# Patient Record
Sex: Female | Born: 1935 | Race: White | Hispanic: No | Smoking: Never smoker
Health system: Southern US, Community
[De-identification: ages and names within clinical notes are randomized; demographics above are authoritative.]

## PROBLEM LIST (undated history)

## (undated) DIAGNOSIS — K219 Gastro-esophageal reflux disease without esophagitis: Secondary | ICD-10-CM

## (undated) DIAGNOSIS — I499 Cardiac arrhythmia, unspecified: Secondary | ICD-10-CM

## (undated) DIAGNOSIS — I639 Cerebral infarction, unspecified: Secondary | ICD-10-CM

## (undated) DIAGNOSIS — J849 Interstitial pulmonary disease, unspecified: Secondary | ICD-10-CM

## (undated) DIAGNOSIS — M810 Age-related osteoporosis without current pathological fracture: Secondary | ICD-10-CM

## (undated) DIAGNOSIS — I4891 Unspecified atrial fibrillation: Secondary | ICD-10-CM

## (undated) DIAGNOSIS — M1990 Unspecified osteoarthritis, unspecified site: Secondary | ICD-10-CM

## (undated) DIAGNOSIS — I82409 Acute embolism and thrombosis of unspecified deep veins of unspecified lower extremity: Secondary | ICD-10-CM

## (undated) HISTORY — DX: Gastro-esophageal reflux disease without esophagitis: K21.9

## (undated) HISTORY — PX: EYE SURGERY: SHX253

## (undated) HISTORY — DX: Age-related osteoporosis without current pathological fracture: M81.0

## (undated) HISTORY — PX: BILATERAL CARPAL TUNNEL RELEASE: SHX6508

## (undated) HISTORY — PX: ABDOMINAL HYSTERECTOMY: SHX81

## (undated) HISTORY — DX: Interstitial pulmonary disease, unspecified: J84.9

---

## 2000-12-01 ENCOUNTER — Ambulatory Visit (HOSPITAL_COMMUNITY): Admission: RE | Admit: 2000-12-01 | Discharge: 2000-12-01 | Payer: Self-pay

## 2002-01-11 ENCOUNTER — Ambulatory Visit (HOSPITAL_COMMUNITY): Admission: RE | Admit: 2002-01-11 | Discharge: 2002-01-11 | Payer: Self-pay

## 2003-01-17 ENCOUNTER — Ambulatory Visit (HOSPITAL_COMMUNITY): Admission: RE | Admit: 2003-01-17 | Discharge: 2003-01-17 | Payer: Self-pay

## 2004-01-18 ENCOUNTER — Ambulatory Visit (HOSPITAL_COMMUNITY): Admission: RE | Admit: 2004-01-18 | Discharge: 2004-01-18 | Payer: Self-pay | Admitting: *Deleted

## 2005-02-16 ENCOUNTER — Ambulatory Visit (HOSPITAL_COMMUNITY): Admission: RE | Admit: 2005-02-16 | Discharge: 2005-02-16 | Payer: Self-pay | Admitting: Family Medicine

## 2005-08-10 ENCOUNTER — Ambulatory Visit: Payer: Self-pay | Admitting: Cardiovascular Disease

## 2005-09-03 ENCOUNTER — Ambulatory Visit: Payer: Self-pay | Admitting: Gastroenterology

## 2006-02-22 ENCOUNTER — Ambulatory Visit (HOSPITAL_COMMUNITY): Admission: RE | Admit: 2006-02-22 | Discharge: 2006-02-22 | Payer: Self-pay | Admitting: Nurse Practitioner

## 2006-03-25 ENCOUNTER — Ambulatory Visit: Payer: Self-pay | Admitting: Nurse Practitioner

## 2006-03-31 ENCOUNTER — Ambulatory Visit: Payer: Self-pay | Admitting: General Surgery

## 2006-03-31 ENCOUNTER — Other Ambulatory Visit: Payer: Self-pay

## 2006-04-05 ENCOUNTER — Ambulatory Visit: Payer: Self-pay | Admitting: General Surgery

## 2007-01-13 ENCOUNTER — Ambulatory Visit: Payer: Self-pay | Admitting: *Deleted

## 2007-01-24 ENCOUNTER — Ambulatory Visit: Payer: Self-pay | Admitting: Family Medicine

## 2007-02-24 ENCOUNTER — Ambulatory Visit (HOSPITAL_COMMUNITY): Admission: RE | Admit: 2007-02-24 | Discharge: 2007-02-24 | Payer: Self-pay | Admitting: Family Medicine

## 2008-02-28 ENCOUNTER — Ambulatory Visit (HOSPITAL_COMMUNITY): Admission: RE | Admit: 2008-02-28 | Discharge: 2008-02-28 | Payer: Self-pay | Admitting: Family Medicine

## 2009-04-08 ENCOUNTER — Ambulatory Visit (HOSPITAL_COMMUNITY): Admission: RE | Admit: 2009-04-08 | Discharge: 2009-04-08 | Payer: Self-pay | Admitting: Family Medicine

## 2009-04-15 ENCOUNTER — Ambulatory Visit: Payer: Self-pay | Admitting: Family Medicine

## 2010-03-05 ENCOUNTER — Ambulatory Visit: Payer: Self-pay | Admitting: Gastroenterology

## 2010-06-02 ENCOUNTER — Ambulatory Visit: Payer: Self-pay | Admitting: Gastroenterology

## 2010-06-04 LAB — PATHOLOGY REPORT

## 2010-08-03 ENCOUNTER — Encounter: Payer: Self-pay | Admitting: Nurse Practitioner

## 2011-01-07 ENCOUNTER — Other Ambulatory Visit (HOSPITAL_COMMUNITY): Payer: Self-pay | Admitting: Family Medicine

## 2011-01-07 DIAGNOSIS — Z139 Encounter for screening, unspecified: Secondary | ICD-10-CM

## 2011-01-09 ENCOUNTER — Ambulatory Visit (HOSPITAL_COMMUNITY)
Admission: RE | Admit: 2011-01-09 | Discharge: 2011-01-09 | Disposition: A | Discharge status: Home / Self Care | Payer: Medicare Other | Source: Ambulatory Visit | Attending: Family Medicine | Admitting: Family Medicine

## 2011-01-09 ENCOUNTER — Ambulatory Visit (HOSPITAL_COMMUNITY): Payer: Medicare Other

## 2011-01-09 DIAGNOSIS — Z1231 Encounter for screening mammogram for malignant neoplasm of breast: Secondary | ICD-10-CM | POA: Insufficient documentation

## 2011-01-09 DIAGNOSIS — Z139 Encounter for screening, unspecified: Secondary | ICD-10-CM

## 2011-05-15 ENCOUNTER — Emergency Department: Payer: Self-pay | Admitting: Emergency Medicine

## 2011-11-19 ENCOUNTER — Observation Stay: Payer: Self-pay | Admitting: Internal Medicine

## 2011-11-19 LAB — CBC
MCHC: 32.7 g/dL (ref 32.0–36.0)
MCV: 100 fL (ref 80–100)
WBC: 5.3 10*3/uL (ref 3.6–11.0)

## 2011-11-19 LAB — URINALYSIS, COMPLETE
Bacteria: NONE SEEN
Nitrite: NEGATIVE
Ph: 5 (ref 4.5–8.0)
Specific Gravity: 1.012 (ref 1.003–1.030)
WBC UR: 4 /HPF (ref 0–5)

## 2011-11-19 LAB — TROPONIN I: Troponin-I: <0.02 ng/mL

## 2011-11-19 LAB — COMPREHENSIVE METABOLIC PANEL
Alkaline Phosphatase: 48 U/L — ABNORMAL LOW (ref 50–136)
Co2: 29 mmol/L (ref 21–32)
Glucose: 90 mg/dL (ref 65–99)
Potassium: 3.7 mmol/L (ref 3.5–5.1)
SGPT (ALT): 18 U/L
Total Protein: 6.6 g/dL (ref 6.4–8.2)

## 2011-11-19 LAB — CK TOTAL AND CKMB (NOT AT ARMC): CK, Total: 118 U/L (ref 21–215)

## 2011-11-20 DIAGNOSIS — I369 Nonrheumatic tricuspid valve disorder, unspecified: Secondary | ICD-10-CM

## 2011-11-20 LAB — LIPID PANEL
Cholesterol: 163 mg/dL (ref 0–200)
Ldl Cholesterol, Calc: 71 mg/dL (ref 0–100)
VLDL Cholesterol, Calc: 13 mg/dL (ref 5–40)

## 2011-11-20 LAB — CK-MB
CK-MB: 1.2 ng/mL (ref 0.5–3.6)
CK-MB: 1.6 ng/mL (ref 0.5–3.6)

## 2011-12-03 ENCOUNTER — Encounter (HOSPITAL_COMMUNITY): Payer: Self-pay | Admitting: Emergency Medicine

## 2011-12-03 ENCOUNTER — Emergency Department (HOSPITAL_COMMUNITY)
Admission: EM | Admit: 2011-12-03 | Discharge: 2011-12-03 | Disposition: A | Discharge status: Home / Self Care | Payer: Medicare Other | Attending: Emergency Medicine | Admitting: Emergency Medicine

## 2011-12-03 DIAGNOSIS — I4891 Unspecified atrial fibrillation: Secondary | ICD-10-CM | POA: Insufficient documentation

## 2011-12-03 DIAGNOSIS — R202 Paresthesia of skin: Secondary | ICD-10-CM

## 2011-12-03 DIAGNOSIS — R209 Unspecified disturbances of skin sensation: Secondary | ICD-10-CM | POA: Insufficient documentation

## 2011-12-03 DIAGNOSIS — Z8679 Personal history of other diseases of the circulatory system: Secondary | ICD-10-CM | POA: Insufficient documentation

## 2011-12-03 HISTORY — DX: Unspecified osteoarthritis, unspecified site: M19.90

## 2011-12-03 HISTORY — DX: Cerebral infarction, unspecified: I63.9

## 2011-12-03 HISTORY — DX: Unspecified atrial fibrillation: I48.91

## 2011-12-03 NOTE — ED Notes (Signed)
Patient given discharge paperwork; went over discharge instructions with patient.  Instructed patient to follow up with primary care physician/referral within four days and ask about Holter monitor.  Patient instructed to return to the ED for new, worsening, or concerning symptoms.

## 2011-12-03 NOTE — Discharge Instructions (Signed)
The tingling in your lips are not indicative of a stroke. Get plenty of rest, and drink a lot of fluids. Call your doctor to arrange followup care. And may be helpful to get a Holter monitor to evaluate your periods of dizziness.    Paresthesia Paresthesia is a burning or prickling feeling. This feeling can happen in any part of the body. It often happens in the hands, arms, legs, or feet. HOME CARE  Avoid drinking alcohol.   Try massage or needle therapy (acupuncture) to help with your problems.   Keep all doctor visits as told.  GET HELP RIGHT AWAY IF:   You feel weak.   You have trouble walking or moving.   You have problems speaking or seeing.   You feel confused.   You cannot control when you poop (bowel movement) or pee (urinate).   You lose feeling (numbness) after an injury.   You pass out (faint).   Your burning or prickling feeling gets worse when you walk.   You have pain, cramps, or feel dizzy.   You have a rash.  MAKE SURE YOU:   Understand these instructions.   Will watch your condition.   Will get help right away if you are not doing well or get worse.  Document Released: 06/11/2008 Document Revised: 06/18/2011 Document Reviewed: 03/20/2011 [facility] Patient Information 2012 [place], [place].

## 2011-12-03 NOTE — ED Notes (Signed)
Patient with recent history of stroke with tingling in lips and moving down to feet.  Patient states is on plavix.  No facial droop noted, equal hand grips and equal foot pushes.

## 2011-12-03 NOTE — ED Provider Notes (Addendum)
History     CSN: [id number]  Arrival date & time 12/03/11  2100   First MD Initiated Contact with Patient 12/03/11 2219      Chief Complaint  Patient presents with  . Tingling    (Consider location/radiation/quality/duration/timing/severity/associated sxs/prior treatment) HPI Comments: Kelly Wright is a 78 y.o. Female who states that she has been having a varying tingling sensation of both sides of her upper and lower lips since yesterday. She has felt dizzy at times, but it is not persistent. She has no trouble talking, walking, lifting, and Eating. She denies headache. She was diagnosed with a left brain stroke, 2 weeks ago. She was put on Plavix. When she went to the hospital 2 weeks ago, an ambulance attendant told her that she might have atrial fibrillation. Today, she went to an urgent care for the tingling and was told that she might have atrial fibrillation, but they're EKG machine was broken, so she came here to be evaluated further.   The history is provided by the patient.    Past Medical History  Diagnosis Date  . Cerebral vascular accident   . Atrial fibrillation   . Arthritis     History reviewed. No pertinent past surgical history.  History reviewed. No pertinent family history.  History  Substance Use Topics  . Smoking status: Never Smoker   . Smokeless tobacco: Not on file  . Alcohol Use: No    OB History    Grav Para Term Preterm Abortions TAB SAB Ect Mult Living                  Review of Systems  All other systems reviewed and are negative.    Allergies  Review of patient's allergies indicates no known allergies.  Home Medications   Current Outpatient Rx  Name Route Sig Dispense Refill  . CLOPIDOGREL BISULFATE 75 MG PO TABS Oral Take 75 mg by mouth daily.    [name] FOLIC ACID 1 MG PO TABS Oral Take 1 mg by mouth daily.    [name] METHOTREXATE 2.5 MG PO TABS Oral Take 12.5 mg by mouth once a week. Caution:Chemotherapy. Protect from light.    [name]  METOPROLOL SUCCINATE ER 50 MG PO TB24 Oral Take 50 mg by mouth daily. Take with or immediately following a meal.    . SIMVASTATIN 20 MG PO TABS Oral Take 20 mg by mouth at bedtime.      BP 130/54  Pulse 66  Temp(Src) 97.9 F (36.6 C) (Oral)  Resp 17  SpO2 100%  Physical Exam  Nursing note and vitals reviewed. Constitutional: She is oriented to person, place, and time. She appears well-developed and well-nourished.  HENT:  Head: Normocephalic and atraumatic.  Eyes: Conjunctivae and EOM are normal. Pupils are equal, round, and reactive to light.  Neck: Normal range of motion and phonation normal. Neck supple.  Cardiovascular: Normal rate, regular rhythm and intact distal pulses.   Pulmonary/Chest: Effort normal and breath sounds normal. She exhibits no tenderness.  Abdominal: Soft. She exhibits no distension. There is no tenderness. There is no guarding.  Musculoskeletal: Normal range of motion.  Neurological: She is alert and oriented to person, place, and time. She has normal strength. She exhibits normal muscle tone.       No aphasia or dysarthria. No facial numbness to light touch sensation.  Skin: Skin is warm and dry.  Psychiatric: She has a normal mood and affect. Her behavior is normal. Judgment and thought content  normal.    ED Course  Procedures (including critical care time)  Labs Reviewed - No data to display No results found.  Date: 12/03/2011  Rate: 59  Rhythm: normal sinus rhythm  QRS Axis: normal  Intervals: normal  ST/T Wave abnormalities: normal  Conduction Disutrbances:none  Narrative Interpretation:   Old EKG Reviewed: none available   1. Paresthesia       MDM  Nonspecific symptoms, doubt CVA, metabolic instability or occult infection. Patient stable for discharge.   Plan: Home Medications- usual; Home Treatments- rest; Recommended follow up- PCP in 4 days, consider Holter for periodic dizziness        Flint Melter, MD 12/03/11  2325  Flint Melter, MD 12/03/11 2326

## 2011-12-03 NOTE — ED Notes (Signed)
Pt had a stroke 2 weeks ago. Symptoms including tingling of lips and feet before the last stroke. Pt stated today she started to have the same symptoms. Pt has a history of A-fib and is currently on Plavix. Pt denies pain

## 2012-03-10 ENCOUNTER — Ambulatory Visit: Payer: Self-pay | Admitting: Family Medicine

## 2012-08-30 ENCOUNTER — Ambulatory Visit: Payer: Self-pay | Admitting: Family Medicine

## 2013-04-03 ENCOUNTER — Emergency Department: Payer: Self-pay | Admitting: Emergency Medicine

## 2013-04-03 LAB — URINALYSIS, COMPLETE
Bilirubin,UR: NEGATIVE
Hyaline Cast: 4
Nitrite: NEGATIVE
Ph: 5 (ref 4.5–8.0)
Protein: NEGATIVE
RBC,UR: 3 /HPF (ref 0–5)
Specific Gravity: 1.019 (ref 1.003–1.030)
Squamous Epithelial: 2
WBC UR: 19 /HPF (ref 0–5)

## 2013-04-03 LAB — COMPREHENSIVE METABOLIC PANEL
Albumin: 3.4 g/dL (ref 3.4–5.0)
BUN: 20 mg/dL — ABNORMAL HIGH (ref 7–18)
Bilirubin,Total: 0.4 mg/dL (ref 0.2–1.0)
Chloride: 108 mmol/L — ABNORMAL HIGH (ref 98–107)
Creatinine: 0.97 mg/dL (ref 0.60–1.30)
EGFR (Non-African Amer.): 56 — ABNORMAL LOW
Glucose: 104 mg/dL — ABNORMAL HIGH (ref 65–99)
Osmolality: 284 (ref 275–301)
Potassium: 3.8 mmol/L (ref 3.5–5.1)
SGOT(AST): 23 U/L (ref 15–37)
SGPT (ALT): 19 U/L (ref 12–78)
Sodium: 141 mmol/L (ref 136–145)

## 2013-04-03 LAB — CBC
HCT: 36.8 % (ref 35.0–47.0)
MCHC: 33.8 g/dL (ref 32.0–36.0)
MCV: 97 fL (ref 80–100)
Platelet: 152 10*3/uL (ref 150–440)
RBC: 3.79 10*6/uL — ABNORMAL LOW (ref 3.80–5.20)
RDW: 15.1 % — ABNORMAL HIGH (ref 11.5–14.5)

## 2013-04-12 ENCOUNTER — Ambulatory Visit: Payer: Self-pay | Admitting: Gastroenterology

## 2014-04-13 ENCOUNTER — Ambulatory Visit: Payer: Medicare Other | Admitting: Family Medicine

## 2014-11-04 NOTE — H&P (Signed)
PATIENT NAME:  Kelly Wright, Kelly Wright MR#:  [id number] DATE OF BIRTH:  March 05, 1933  DATE OF ADMISSION:  11/19/2011  PRIMARY CARE PHYSICIAN: Darreld Mclean, MD, [facility]   CHIEF COMPLAINT: Difficulty speaking today and right facial droop.   HISTORY OF PRESENT ILLNESS: The patient is a pleasant 81 year old Caucasian female with history of rheumatoid arthritis who comes to the Emergency Room accompanied by her daughter with the above-mentioned chief complaint. The patient was mowing the lawn this afternoon. Physical therapy who works with the patient's husband saw the patient and did not seem to look right with some right facial droop and the patient had difficulty producing words. She could comprehend but was not able to say any sentences. She could say yes or no only. Physical therapist got worried, alarmed the patient's family members who called EMS and brought her to the Emergency Room. The patient did have a right facial droop. By the time she came in the Emergency Room it was more than three hours since her symptoms happened and she started having clear speech with clear thought process. It improved over the course. She received aspirin 325 mg in the Emergency Room. The patient's blood pressure is stable. Her CT head is negative for ischemic stroke. She is being admitted for possible TIA.   PAST MEDICAL HISTORY:  1. Rheumatoid arthritis.  2. Irregular heartbeat, on metoprolol for that.  3. Anxiety.   PAST SURGICAL HISTORY:  1. Cholecystectomy.  2. Partial hysterectomy.   SOCIAL HISTORY: Lives at home with her husband. Nonsmoker. Nonalcoholic.   ALLERGIES: No known drug allergies.   MEDICATIONS:  1. Alprazolam 0.5 mg p.o. daily as needed.  2. Folic acid 1 mg daily.  3. Loratadine 10 mg daily.  4. Methotrexate 2.5 mg 5 tablets orally once a week on Wednesday.  5. Metoprolol ER 50 mg p.o. daily.   FAMILY HISTORY: Positive for emphysema in father. Mother had Hodgkin's lymphoma. Grandmother had CVA.    REVIEW OF SYSTEMS: CONSTITUTIONAL: No fever, fatigue, weakness. EYES: No blurred or double vision. ENT: No tinnitus, ear pain, hearing loss. RESPIRATORY: No cough, wheeze, hemoptysis. CARDIOVASCULAR: No chest pain, orthopnea, edema. GI: No nausea, vomiting, diarrhea, or abdominal pain. GU: No dysuria or hematuria. ENDOCRINE: No polyuria or nocturia. HEMATOLOGY: No anemia or easy bruising. SKIN: No acne or rash. MUSCULOSKELETAL: Positive for arthritis. NEUROLOGIC: The patient did have some dysarthria. No focal weakness. SKIN: Warm and dry. All other systems reviewed and negative.   PHYSICAL EXAMINATION:   GENERAL: The patient is awake, alert, oriented x3 not in acute distress.   VITAL SIGNS: She is afebrile, pulse 57, blood pressure 149/68, sats are 98% on room air.   HEENT: Atraumatic, normocephalic. Pupils equal, round, and reactive to light and accommodation. Extraocular movements intact. Oral mucosa is moist.   NECK: Supple. No JVD. No carotid bruit.   RESPIRATORY: Clear to auscultation bilaterally. No rales, rhonchi, respiratory distress, or labored breathing.   CARDIOVASCULAR: Both the heart sounds are normal. Rate, rhythm is regular. PMI not lateralized. Chest nontender. No lower extremity edema.   ABDOMEN: Soft, benign, nontender. No organomegaly. Positive bowel sounds.   NEUROLOGIC: Grossly intact cranial nerves II through XII. The patient does have a very subtle right facial droop. No dysarthria or expressive aphasia. Motor strength 4+/5 in both upper and lower extremities. No sensory deficits. Reflexes 1+ in both upper and lower extremities. Plantars downgoing.   SKIN: Warm and dry.   LABORATORY, DIAGNOSTIC, AND RADIOLOGICAL DATA: Urinalysis negative for UTI.  CT of the head without contrast no evidence of acute intracranial abnormality. CBC within normal limits. Comprehensive metabolic panel within normal limits. EKG shows sinus bradycardia. No acute ST elevation or depression.    ASSESSMENT: 81 year old Ms. Sacra with:  1. Transient ischemic attack. The patient had symptoms of expressive aphasia. She was able to comprehend, however, unable to produce words for a couple of hours which improved. Course in the Emergency Room she is able to comprehend and talk without any difficulty. The patient also had some mild very subtle right facial droop. CT of the head is negative.  2. Irregular heart rhythm for which patient is on beta-blockers. Currently rhythm is bradycardic.  3. Elevated blood pressure without diagnosis of hypertension.  4. Rheumatoid arthritis.  5. Anxiety.    PLAN:  1. Admit patient to telemetry floor for overnight observation.  2. FULL CODE.  3. Check lipid profile.  4. IV fluids x1 liter.  5. Will continue all home medications.  6. Start patient on regular strength 325 mg p.o. daily aspirin.  7. Check MRI in the morning and ultrasound carotid Doppler along with Echo Doppler.  8. Physical therapy to see the patient in consultation.  9. Will continue regular diet for now.  10. Further work-up according to the patient's clinical course.   Hospital admission plan was discussed with the patient and family members who is agreeable to it.   TIME SPENT: 50 minutes.   ____________________________ Wylie Hail Allena Katz, MD sap:drc D: 11/19/2011 20:15:25 ET T: 11/20/2011 06:35:30 ET JOB#: [id number] cc: Terressa Evola A. Allena Katz, MD, <Dictator>, Leanna Sato, MD Willow Ora MD ELECTRONICALLY SIGNED 11/22/2011 13:56

## 2014-11-04 NOTE — Discharge Summary (Signed)
PATIENT NAME:  Kelly Wright, Kelly Wright MR#:  [id number] DATE OF BIRTH:  [birth year]  DATE OF ADMISSION:  11/19/2011 DATE OF DISCHARGE:  11/20/2011  PRIMARY CARE PHYSICIAN: Dr. Darreld Mclean, M.D.   FINAL DIAGNOSES:  1. Acute stroke left frontal temporal region.  2. Urinary tract infection.  3. Hypertension.  4. Rheumatoid arthritis.   MEDICATIONS ON DISCHARGE:  Methotrexate 2.5 mg 5 tablets once a week on Wednesday, metoprolol ER 50 mg daily. Folic acid 1 mg daily. Xanax 0.5 mg 1/2 tablet as needed. Loratadine 10 mg daily. Plavix 75 mg daily. Bactrim DS 1 tablet twice a day until completion. Lipitor 10 mg p.o. nightly.   DIET: Low sodium diet.   ACTIVITY: Activity as tolerated.   FOLLOWUP: With home health PT and R.N., follow-up in one week with Dr. Darreld Mclean. No driving until follow-up appointment.   REASON FOR ADMISSION: The patient was admitted 11/19/2011, discharged 11/20/2011. Came in with chief complaint of difficulty speaking and right facial droop.   HISTORY OF PRESENT ILLNESS: 81 year old female with history of rheumatoid arthritis came in with difficulty speaking and right facial droop. The patient had difficulty producing words and was able to answer yes and no questions. The patient received an aspirin 325 mg in the Emergency Room and her CT scan of the head was negative. The patient was admitted for transient ischemic attack type symptoms.   LABORATORY, DIAGNOSTIC, AND RADIOLOGICAL DATA: During the hospital course included a troponin that was negative. Glucose 90, BUN 17, creatinine 0.91, sodium 144, potassium 3.7, chloride 109, CO2 29, calcium 8.7. Liver function tests normal range, white blood cell count 5.3, hemoglobin and hematocrit 12.2 and 37.4, platelet count 132. CT scan of the head showed no acute evidence of acute intracranial abnormality. EKG showed sinus bradycardia, nonspecific ST-T wave changes. Urine culture grew out mixed bacterial organisms.   URINALYSIS: 2+ leukocyte  esterase. LDL 71, HDL 79, triglycerides 64. Echocardiogram showed no cardiac source of emboli noted. LV systolic function normal, ejection fraction greater than 55%, borderline concentric left ventricular hypertrophy, mild mitral regurgitation and mild to moderate tricuspid regurgitation. Ultrasound of the carotids showed no evidence of hemodynamically significant stenosis bilaterally. MRI of the brain showed punctate tiny area of acute ischemia in the left frontal temporal region. White matter changes consistent with chronic ischemia, vascular flow voids are normal.   COURSE PER PROBLEM LIST:  1. For acute stroke in the left frontal temporal region, she was given an aspirin in the Emergency Room, she was switched over to Plavix because the  family states that she takes an aspirin at home. Since she had no of focal neurological deficits, she was walking well and strength was good, she was discharged home. Plavix was started and a small dose of Lipitor started. She will follow up with Dr. Darreld Mclean. She has had some difficulty with thinking so home health R.N. and home health PT were ordered. I advised no driving until follow-up appointment with Dr. Darreld Mclean.  2. For urinary tract infection. Bactrim was prescribed. Urine culture grew out contaminant. Can consider further testing as outpatient.  3. For hypertension and bradycardia. Metoprolol was given but I think this is more secondary to heart arrhythmia. I asked the patient if she wanted to decrease her dose of metoprolol and she did not want to. Heart rate upon discharge was 54, blood pressure ranged between 103/56, and 147/72. 4. For her rheumatoid arthritis, she is on methotrexate and folic acid.   Time Spent ON  discharge: 40 minutes. The patient was discharged home in stable condition with close clinical follow-up.    ____________________________ Herschell Dimes. Renae Gloss, MD rjw:ljs D: 11/23/2011 12:34:00 ET      T: 11/24/2011 12:30:08  ET JOB#: [id number] cc: Herschell Dimes. Renae Gloss, MD, <Dictator> Leanna Sato, MD Salley Scarlet MD ELECTRONICALLY SIGNED 11/29/2011 14:41

## 2014-11-11 ENCOUNTER — Inpatient Hospital Stay
Admission: EM | Admit: 2014-11-11 | Discharge: 2014-11-16 | DRG: 689 | Disposition: A | Discharge status: Home / Self Care | Payer: Medicare Other | Attending: Internal Medicine | Admitting: Internal Medicine

## 2014-11-11 ENCOUNTER — Encounter: Payer: Self-pay | Admitting: Emergency Medicine

## 2014-11-11 DIAGNOSIS — N390 Urinary tract infection, site not specified: Secondary | ICD-10-CM

## 2014-11-11 DIAGNOSIS — Z79899 Other long term (current) drug therapy: Secondary | ICD-10-CM

## 2014-11-11 DIAGNOSIS — I4891 Unspecified atrial fibrillation: Secondary | ICD-10-CM

## 2014-11-11 DIAGNOSIS — M1990 Unspecified osteoarthritis, unspecified site: Secondary | ICD-10-CM | POA: Diagnosis present

## 2014-11-11 DIAGNOSIS — N3090 Cystitis, unspecified without hematuria: Principal | ICD-10-CM | POA: Diagnosis present

## 2014-11-11 DIAGNOSIS — Z7982 Long term (current) use of aspirin: Secondary | ICD-10-CM

## 2014-11-11 DIAGNOSIS — M069 Rheumatoid arthritis, unspecified: Secondary | ICD-10-CM

## 2014-11-11 DIAGNOSIS — Z823 Family history of stroke: Secondary | ICD-10-CM

## 2014-11-11 DIAGNOSIS — G9341 Metabolic encephalopathy: Secondary | ICD-10-CM

## 2014-11-11 DIAGNOSIS — Z86718 Personal history of other venous thrombosis and embolism: Secondary | ICD-10-CM

## 2014-11-11 DIAGNOSIS — Z7901 Long term (current) use of anticoagulants: Secondary | ICD-10-CM

## 2014-11-11 DIAGNOSIS — Z8673 Personal history of transient ischemic attack (TIA), and cerebral infarction without residual deficits: Secondary | ICD-10-CM

## 2014-11-11 DIAGNOSIS — Z7902 Long term (current) use of antithrombotics/antiplatelets: Secondary | ICD-10-CM

## 2014-11-11 DIAGNOSIS — I499 Cardiac arrhythmia, unspecified: Secondary | ICD-10-CM | POA: Diagnosis present

## 2014-11-11 HISTORY — DX: Acute embolism and thrombosis of unspecified deep veins of unspecified lower extremity: I82.409

## 2014-11-11 HISTORY — DX: Cardiac arrhythmia, unspecified: I49.9

## 2014-11-11 LAB — URINALYSIS COMPLETE WITH MICROSCOPIC (ARMC ONLY)
Bilirubin Urine: NEGATIVE
Glucose, UA: NEGATIVE mg/dL
HGB URINE DIPSTICK: NEGATIVE
NITRITE: NEGATIVE
PROTEIN: 30 mg/dL — AB
SPECIFIC GRAVITY, URINE: 1.027 (ref 1.005–1.030)
pH: 5 (ref 5.0–8.0)

## 2014-11-11 LAB — CBC
HEMATOCRIT: 42.7 % (ref 35.0–47.0)
Hemoglobin: 14.1 g/dL (ref 12.0–16.0)
MCH: 32.8 pg (ref 26.0–34.0)
MCHC: 33.1 g/dL (ref 32.0–36.0)
MCV: 99.1 fL (ref 80.0–100.0)
Platelets: 138 10*3/uL — ABNORMAL LOW (ref 150–440)
RBC: 4.31 MIL/uL (ref 3.80–5.20)
RDW: 14.9 % — AB (ref 11.5–14.5)
WBC: 4.7 10*3/uL (ref 3.6–11.0)

## 2014-11-11 LAB — COMPREHENSIVE METABOLIC PANEL
ALBUMIN: 4 g/dL (ref 3.5–5.0)
ALK PHOS: 47 U/L (ref 38–126)
ALT: 16 U/L (ref 14–54)
ANION GAP: 9 (ref 5–15)
AST: 28 U/L (ref 15–41)
BILIRUBIN TOTAL: 1.3 mg/dL — AB (ref 0.3–1.2)
BUN: 20 mg/dL (ref 6–20)
CHLORIDE: 103 mmol/L (ref 101–111)
CO2: 26 mmol/L (ref 22–32)
Calcium: 8.9 mg/dL (ref 8.9–10.3)
Creatinine, Ser: 0.98 mg/dL (ref 0.44–1.00)
GFR calc Af Amer: >60 mL/min (ref >60)
GFR, EST NON AFRICAN AMERICAN: 53 mL/min — AB (ref >60)
Glucose, Bld: 143 mg/dL — ABNORMAL HIGH (ref 65–99)
POTASSIUM: 3.7 mmol/L (ref 3.5–5.1)
Sodium: 138 mmol/L (ref 135–145)
Total Protein: 6.8 g/dL (ref 6.5–8.1)

## 2014-11-11 LAB — PROTIME-INR
INR: 1.23
PROTHROMBIN TIME: 15.7 s — AB (ref 11.4–15.0)

## 2014-11-11 MED ORDER — DEXTROSE 5 % IV SOLN
1.0000 g | Freq: Once | INTRAVENOUS | Status: AC
  Administered 2014-11-11: 1 g via INTRAVENOUS

## 2014-11-11 MED ORDER — ONDANSETRON HCL 4 MG/2ML IJ SOLN
INTRAMUSCULAR | Status: AC
  Administered 2014-11-11: 4 mg via INTRAVENOUS
  Filled 2014-11-11: qty 2

## 2014-11-11 MED ORDER — SODIUM CHLORIDE 0.9 % IV SOLN
Freq: Once | INTRAVENOUS | Status: AC
  Administered 2014-11-12: 01:00:00 via INTRAVENOUS

## 2014-11-11 MED ORDER — ONDANSETRON HCL 4 MG/2ML IJ SOLN
4.0000 mg | Freq: Once | INTRAMUSCULAR | Status: AC
  Administered 2014-11-11: 4 mg via INTRAVENOUS

## 2014-11-11 MED ORDER — DEXTROSE 5 % IV SOLN
INTRAVENOUS | Status: AC
  Filled 2014-11-11: qty 10

## 2014-11-11 NOTE — ED Provider Notes (Signed)
[facility] Emergency Department Provider Note    ____________________________________________  Time seen: ----------------------------------------- 11:32 PM on 11/11/2014 -----------------------------------------    I have reviewed the triage vital signs and the nursing notes.   HISTORY  Chief Complaint Altered Mental Status       HPI Kelly Wright is a 81 y.o. female with a past medical history of CVA, atrial fibrillation, and DVT on Xarelto. Patient presents to the emergency department today with confusion, weakness, and vomiting. According to the family the patient has been treated for UTI for approximately one week. Patient was initially on doxycycline, and changed to ciprofloxacin. The family is unsure if the antibiotics are working as the patient is vomiting at home. The daughter visited the patient today, and noted she was confused and very weak. The patient denies any fever at home. Denies abdominal pain. Denies dysuria. According to the daughter the patient does have intermittent mild confusion, but has been much worse over the last 24 hours.     Past Medical History  Diagnosis Date  . Cerebral vascular accident   . Atrial fibrillation   . Arthritis   . DVT (deep venous thrombosis)     There are no active problems to display for this patient.   Past Surgical History  Procedure Laterality Date  . Abdominal hysterectomy      Current Outpatient Rx  Name  Route  Sig  Dispense  Refill  . esomeprazole (NEXIUM) 20 MG capsule   Oral   Take 20 mg by mouth daily at 12 noon.         . folic acid (FOLVITE) 1 MG tablet   Oral   Take 1 mg by mouth daily.         [name] lisinopril (PRINIVIL,ZESTRIL) 10 MG tablet   Oral   Take 10 mg by mouth daily.         . methotrexate (RHEUMATREX) 2.5 MG tablet   Oral   Take 12.5 mg by mouth once a week. Caution:Chemotherapy. Protect from light.         . rivaroxaban (XARELTO) 20 MG TABS tablet    Oral   Take 20 mg by mouth daily with supper.         . simvastatin (ZOCOR) 20 MG tablet   Oral   Take 20 mg by mouth at bedtime.         . clopidogrel (PLAVIX) 75 MG tablet   Oral   Take 75 mg by mouth daily.         . metoprolol succinate (TOPROL-XL) 50 MG 24 hr tablet   Oral   Take 50 mg by mouth daily. Take with or immediately following a meal.           Allergies Review of patient's allergies indicates no known allergies.  History reviewed. No pertinent family history.  Social History History  Substance Use Topics  . Smoking status: Never Smoker   . Smokeless tobacco: Not on file  . Alcohol Use: No    Review of Systems  Constitutional: Negative for fever. ENT: Mild sinus congestion. Cardiovascular: Negative for chest pain. Respiratory: Negative for shortness of breath. Gastrointestinal: Negative for abdominal pain. Positive for nausea and vomiting. Genitourinary: Negative for dysuria. Musculoskeletal: Negative for back pain. Skin: Negative for rash. Neurological: Negative for headaches, focal weakness or numbness.  10-point ROS otherwise negative.  ____________________________________________   PHYSICAL EXAM:  VITAL SIGNS: ED Triage Vitals  Enc Vitals Group  BP 11/11/14 2015 130/60 mmHg     Pulse Rate 11/11/14 2015 65     Resp 11/11/14 2015 20     Temp 11/11/14 2015 98.6 F (37 C)     Temp Source 11/11/14 2015 Oral     SpO2 11/11/14 2015 100 %     Weight 11/11/14 2015 130 lb (58.968 kg)     Height 11/11/14 2015 5\' 9"  (1.753 m)     Head Cir --      Peak Flow --      Pain Score --      Pain Loc --      Pain Edu? --      Excl. in GC? --      Constitutional: Alert and oriented. Well appearing and in no distress. Eyes: Conjunctivae are normal. PERRL. ENT   Head: Normocephalic and atraumatic.   Nose: Mild congestion/rhinnorhea.   Mouth/Throat: Mucous membranes are moist. Cardiovascular: Normal rate, regular rhythm.   Respiratory: Normal respiratory effort without tachypnea nor retractions. Breath sounds are clear and equal bilaterally. No wheezes/rales/rhonchi. Gastrointestinal: Soft and nontender. No distention. No abdominal bruits. There is no CVA tenderness. Musculoskeletal: Nontender with normal range of motion in all extremities.  Neurologic:  Normal speech and language. No gross focal neurologic deficits are appreciated. Speech is normal.  Skin:  Skin is warm, dry and intact. No rash noted. Psychiatric: Mood and affect are normal.   EKG shows normal sinus rhythm at 68 bpm, narrow QRS, normal intervals, normal axis. Nonspecific ST changes.   INITIAL IMPRESSION / ASSESSMENT AND PLAN / ED COURSE  Pertinent labs & imaging results that were available during my care of the patient were reviewed by me and considered in my medical decision making (see chart for details).  The patient is a 81 year old female who presents to the emergency department with confusion, weakness, nausea and vomiting. The patient's urine is consistent with a urinary tract infection. The patient has failed home therapy. Patient lives alone, along with confusion/weakness/nausea and vomiting, I feel the patient was not safe to continue home therapy. The patient would benefit from inpatient admission for treatment of her urinary tract infection. Overall the patient appears well, nontoxic. I discussed this plan with the patient and family were agreeable.  ____________________________________________   FINAL CLINICAL IMPRESSION(S) / ED DIAGNOSES  Final diagnoses:  UTI (lower urinary tract infection)     78, MD 11/11/14 2350

## 2014-11-11 NOTE — ED Notes (Addendum)
Family says without them knowing pt had gone to [facility] about a week ago with UTI symptoms; a prescription was called in but pt and family unaware; Clinic called a few days later to see why medicine wasn't filled so medication was picked up by family and pt started; several days later pt was not feeling any better and was seen at walkin in clinic; pt started Cipro Thursday or Friday as ordered there; pt continues to feel weak; nausea with some vomiting over the last week; pt says while ambulating to treatment room she thought she may "pass out"; denies syncopal episodes this past week;

## 2014-11-11 NOTE — ED Notes (Signed)
Dr Paduchowski in to see pt 

## 2014-11-11 NOTE — ED Notes (Addendum)
Pt presents to ER with family. Family reports AMS, currently undergoing treatment for UTI. Pt currently alert and oriented. Moving all extremities normally, steady gait.

## 2014-11-12 ENCOUNTER — Encounter: Payer: Self-pay | Admitting: Internal Medicine

## 2014-11-12 DIAGNOSIS — G9341 Metabolic encephalopathy: Secondary | ICD-10-CM | POA: Diagnosis present

## 2014-11-12 DIAGNOSIS — N3090 Cystitis, unspecified without hematuria: Secondary | ICD-10-CM | POA: Diagnosis present

## 2014-11-12 DIAGNOSIS — M069 Rheumatoid arthritis, unspecified: Secondary | ICD-10-CM | POA: Diagnosis present

## 2014-11-12 DIAGNOSIS — I4891 Unspecified atrial fibrillation: Secondary | ICD-10-CM | POA: Diagnosis present

## 2014-11-12 DIAGNOSIS — I499 Cardiac arrhythmia, unspecified: Secondary | ICD-10-CM | POA: Diagnosis present

## 2014-11-12 DIAGNOSIS — Z7982 Long term (current) use of aspirin: Secondary | ICD-10-CM | POA: Diagnosis not present

## 2014-11-12 DIAGNOSIS — Z79899 Other long term (current) drug therapy: Secondary | ICD-10-CM | POA: Diagnosis not present

## 2014-11-12 DIAGNOSIS — Z8673 Personal history of transient ischemic attack (TIA), and cerebral infarction without residual deficits: Secondary | ICD-10-CM | POA: Diagnosis not present

## 2014-11-12 DIAGNOSIS — Z7901 Long term (current) use of anticoagulants: Secondary | ICD-10-CM

## 2014-11-12 DIAGNOSIS — N390 Urinary tract infection, site not specified: Secondary | ICD-10-CM

## 2014-11-12 DIAGNOSIS — Z86718 Personal history of other venous thrombosis and embolism: Secondary | ICD-10-CM | POA: Diagnosis not present

## 2014-11-12 DIAGNOSIS — Z823 Family history of stroke: Secondary | ICD-10-CM | POA: Diagnosis not present

## 2014-11-12 DIAGNOSIS — Z7902 Long term (current) use of antithrombotics/antiplatelets: Secondary | ICD-10-CM | POA: Diagnosis not present

## 2014-11-12 DIAGNOSIS — M1990 Unspecified osteoarthritis, unspecified site: Secondary | ICD-10-CM | POA: Diagnosis present

## 2014-11-12 LAB — CBC
HCT: 39 % (ref 35.0–47.0)
Hemoglobin: 12.8 g/dL (ref 12.0–16.0)
MCH: 32.6 pg (ref 26.0–34.0)
MCHC: 32.8 g/dL (ref 32.0–36.0)
MCV: 99.4 fL (ref 80.0–100.0)
PLATELETS: 122 10*3/uL — AB (ref 150–440)
RBC: 3.92 MIL/uL (ref 3.80–5.20)
RDW: 15.1 % — AB (ref 11.5–14.5)
WBC: 4.8 10*3/uL (ref 3.6–11.0)

## 2014-11-12 LAB — BASIC METABOLIC PANEL
ANION GAP: 6 (ref 5–15)
BUN: 18 mg/dL (ref 6–20)
CHLORIDE: 107 mmol/L (ref 101–111)
CO2: 26 mmol/L (ref 22–32)
Calcium: 8.3 mg/dL — ABNORMAL LOW (ref 8.9–10.3)
Creatinine, Ser: 0.85 mg/dL (ref 0.44–1.00)
GFR calc Af Amer: >60 mL/min (ref >60)
GLUCOSE: 104 mg/dL — AB (ref 65–99)
POTASSIUM: 3.4 mmol/L — AB (ref 3.5–5.1)
Sodium: 139 mmol/L (ref 135–145)

## 2014-11-12 MED ORDER — METHOTREXATE 2.5 MG PO TABS: 12.5000 mg | ORAL_TABLET | ORAL | Status: DC

## 2014-11-12 MED ORDER — RIVAROXABAN 20 MG PO TABS
20.0000 mg | ORAL_TABLET | Freq: Every day | ORAL | Status: DC
  Administered 2014-11-12 – 2014-11-15 (×4): 20 mg via ORAL
  Filled 2014-11-12 (×4): qty 1

## 2014-11-12 MED ORDER — METHOTREXATE 2.5 MG PO TABS
12.5000 mg | ORAL_TABLET | ORAL | Status: DC
  Administered 2014-11-14: 12.5 mg via ORAL
  Filled 2014-11-12: qty 5

## 2014-11-12 MED ORDER — CLOPIDOGREL BISULFATE 75 MG PO TABS
75.0000 mg | ORAL_TABLET | Freq: Every day | ORAL | Status: DC
  Administered 2014-11-12: 75 mg via ORAL
  Filled 2014-11-12: qty 1

## 2014-11-12 MED ORDER — ACETAMINOPHEN 325 MG PO TABS
650.0000 mg | ORAL_TABLET | Freq: Four times a day (QID) | ORAL | Status: DC | PRN
  Administered 2014-11-12 – 2014-11-13 (×2): 650 mg via ORAL
  Filled 2014-11-12 (×2): qty 2

## 2014-11-12 MED ORDER — SODIUM CHLORIDE 0.9 % IJ SOLN: 3.0000 mL | INTRAMUSCULAR | Status: DC | PRN

## 2014-11-12 MED ORDER — ASPIRIN EC 81 MG PO TBEC
81.0000 mg | DELAYED_RELEASE_TABLET | Freq: Every day | ORAL | Status: DC
  Administered 2014-11-12 – 2014-11-16 (×5): 81 mg via ORAL
  Filled 2014-11-12 (×5): qty 1

## 2014-11-12 MED ORDER — METOPROLOL SUCCINATE ER 50 MG PO TB24
50.0000 mg | ORAL_TABLET | Freq: Every day | ORAL | Status: DC
  Administered 2014-11-12: 50 mg via ORAL
  Filled 2014-11-12: qty 1

## 2014-11-12 MED ORDER — SIMVASTATIN 20 MG PO TABS
20.0000 mg | ORAL_TABLET | Freq: Every day | ORAL | Status: DC
  Administered 2014-11-12 – 2014-11-15 (×4): 20 mg via ORAL
  Filled 2014-11-12 (×4): qty 1

## 2014-11-12 MED ORDER — ONDANSETRON HCL 4 MG PO TABS: 4.0000 mg | ORAL_TABLET | Freq: Four times a day (QID) | ORAL | Status: DC | PRN

## 2014-11-12 MED ORDER — SODIUM CHLORIDE 0.9 % IJ SOLN
3.0000 mL | Freq: Two times a day (BID) | INTRAMUSCULAR | Status: DC
  Administered 2014-11-12 – 2014-11-16 (×7): 3 mL via INTRAVENOUS

## 2014-11-12 MED ORDER — FOLIC ACID 1 MG PO TABS
1.0000 mg | ORAL_TABLET | Freq: Every day | ORAL | Status: DC
  Administered 2014-11-12 – 2014-11-16 (×5): 1 mg via ORAL
  Filled 2014-11-12 (×5): qty 1

## 2014-11-12 MED ORDER — ACETAMINOPHEN 650 MG RE SUPP: 650.0000 mg | Freq: Four times a day (QID) | RECTAL | Status: DC | PRN

## 2014-11-12 MED ORDER — LISINOPRIL 10 MG PO TABS
10.0000 mg | ORAL_TABLET | Freq: Every day | ORAL | Status: DC
  Administered 2014-11-12 – 2014-11-13 (×2): 10 mg via ORAL
  Filled 2014-11-12 (×3): qty 1

## 2014-11-12 MED ORDER — ENSURE ENLIVE PO LIQD
237.0000 mL | Freq: Two times a day (BID) | ORAL | Status: DC
  Administered 2014-11-12 – 2014-11-15 (×6): 237 mL via ORAL

## 2014-11-12 MED ORDER — PANTOPRAZOLE SODIUM 40 MG PO TBEC
40.0000 mg | DELAYED_RELEASE_TABLET | Freq: Every day | ORAL | Status: DC
  Administered 2014-11-12 – 2014-11-16 (×5): 40 mg via ORAL
  Filled 2014-11-12 (×5): qty 1

## 2014-11-12 MED ORDER — CEFTRIAXONE SODIUM IN DEXTROSE 20 MG/ML IV SOLN
1.0000 g | INTRAVENOUS | Status: DC
  Administered 2014-11-12 – 2014-11-15 (×4): 1 g via INTRAVENOUS
  Filled 2014-11-12 (×5): qty 50

## 2014-11-12 MED ORDER — ONDANSETRON HCL 4 MG/2ML IJ SOLN
4.0000 mg | Freq: Four times a day (QID) | INTRAMUSCULAR | Status: DC | PRN
  Administered 2014-11-13: 4 mg via INTRAVENOUS
  Filled 2014-11-12: qty 2

## 2014-11-12 NOTE — ED Notes (Signed)
Pt given warm blanket for comfort; shirt/bra/shoes in belonging bag, ready for transport to the floor; pt denies pain; side rail up x 1 with callbell in reach; no complaints; waiting patiently for admitting doctor and transport to the floor

## 2014-11-12 NOTE — H&P (Signed)
[facility] History and Physical  Azucena Dart Smitherman ZOX:[id number] DOB: 15-May-1933 DOA: 11/11/2014  Referring physician: Dr.Paduchowsky PCP: Leanna Sato, MD   Chief Complaint: Confusion                               General weakness with nausea and vomiting HPI: Kelly Wright is a 81 y.o. female  With past medical history as below was brought to the emergency room by the patient's daughter with the complaints of found to having generalized weakness with vomiting and confusion. Patient states that she has been having some generalized weakness with episodes of vomiting since yesterday and hence came to the emergency room. In the emergency room patient was evaluated by the ED physician and was noted to be with stable vitals and the lab work came significant for significant urinary tract infection. Of note the patient was treated for a UTI as an outpatient with oral doxycycline and ciprofloxacin a week ago. Denies any fever, no dysuria frequency or urgency of urination no abdominal pain. Denies any chest pain shortness of breath cough. Denies any focal weakness or numbness.  Past Medical History  Diagnosis Date  . Cerebral vascular accident   . Atrial fibrillation   . Arthritis   . DVT (deep venous thrombosis)   . Dysrhythmia     Past Surgical History  Procedure Laterality Date  . Abdominal hysterectomy      Family History  Problem Relation Age of Onset  . Hodgkin's lymphoma Mother   . Cancer - Lung Father   . CVA Other     Social History  reports that she has never smoked. She does not have any smokeless tobacco history on file. She reports that she does not drink alcohol or use illicit drugs.  No Known Allergies  Prior to Admission medications   Medication Sig Start Date End Date Taking? Authorizing Provider  atorvastatin (LIPITOR) 10 MG tablet Take 10 mg by mouth daily.   Yes Historical Provider, MD  ciprofloxacin (CIPRO) 500 MG tablet Take 500 mg by mouth daily.   Yes  Historical Provider, MD  citalopram (CELEXA) 20 MG tablet Take 20 mg by mouth daily.   Yes Historical Provider, MD  doxycycline (ADOXA) 100 MG tablet Take 100 mg by mouth 2 (two) times daily.   Yes Historical Provider, MD  esomeprazole (NEXIUM) 20 MG capsule Take 20 mg by mouth daily at 12 noon.   Yes Historical Provider, MD  folic acid (FOLVITE) 1 MG tablet Take 1 mg by mouth daily.   Yes Historical Provider, MD  folic acid (FOLVITE) 1 MG tablet Take 1 mg by mouth daily.   Yes Historical Provider, MD  lisinopril (PRINIVIL,ZESTRIL) 10 MG tablet Take 10 mg by mouth daily.   Yes Historical Provider, MD  lisinopril (PRINIVIL,ZESTRIL) 5 MG tablet Take 5 mg by mouth daily.   Yes Historical Provider, MD  methotrexate (RHEUMATREX) 2.5 MG tablet Take 12.5 mg by mouth once a week. Caution:Chemotherapy. Protect from light.   Yes Historical Provider, MD  methotrexate (RHEUMATREX) 2.5 MG tablet Take 2.5 mg by mouth once a week. Caution:Chemotherapy. Protect from light.   Yes Historical Provider, MD  rivaroxaban (XARELTO) 20 MG TABS tablet Take 20 mg by mouth daily with supper.   Yes Historical Provider, MD  rivaroxaban (XARELTO) 20 MG TABS tablet Take 20 mg by mouth daily.   Yes Historical Provider, MD  simvastatin (ZOCOR) 20 MG tablet Take  20 mg by mouth at bedtime.   Yes Historical Provider, MD  clopidogrel (PLAVIX) 75 MG tablet Take 75 mg by mouth daily.    Historical Provider, MD  metoprolol succinate (TOPROL-XL) 50 MG 24 hr tablet Take 50 mg by mouth daily. Take with or immediately following a meal.    Historical Provider, MD     Review of Systems:  Constitutional: No fevers or chills, positive for fatigue& weakness. Eyes: No blurred or double vision, pain, redness, inflammation. ENT: No tinnitus, ear pain, hearing loss, epistaxis, discharge, redness of oropharynx, difficulty swallowing. Resp: No cough, wheeze, hemoptysis, dyspnea, painful respiration. Cardio-vascular: No chest pain, orthopnea,  edema, DOE, palpitations, syncope. GI: Positive for nausea & vomiting, No diarrhea, abdominal pain, hematemesis, melena, GERD, rectal bleeding, constipation. GU: No dysuria, hematuria, urgency, frequency, incontinence Endocrine: No polyuria, nocturia, heat or cold intolerance, thirst Hematologic/Lymphatic: No anemia, easy bruising bleeding, swollen glands Integumentary: No acne, rash, lesions. Musculoskeletal: No pain in: neck-back-shoulder-knee-hip, arthritis, gout, redness. Neuro: No numbness, weakness, dysarthria, epilepsy, tremor, vertigo, ataxia, dementia, headache, migraine, CVA, TIA, seizure, memory loss Psych: No anxiety, insomnia, ADD, OCD, bipolar, depression, schizophrenia.   Physical Exam: Constitutional: Filed Vitals:   11/11/14 2015 11/11/14 2316 11/12/14 0000 11/12/14 0030  BP: 130/60 135/61 102/60 121/59  Pulse: 65 107 51 63  Temp: 98.6 F (37 C)     TempSrc: Oral     Resp: 20   18  Height: 5\' 9"  (1.753 m)     Weight: 58.968 kg (130 lb)     SpO2: 100% 95% 99% 98%   Wt Readings from Last 3 Encounters:  11/11/14 58.968 kg (130 lb)   General:  Well developed, well nourished, in no apparent distress HEENT: PERRL, EOMI, no scleral icterus, no conjunctivitis, no difficulty hearing, no pharyngeal erythema, Mucosa -moist. Neck: Thyroid - no nodules,  supple, no masses, non tender, no adenopathy, no JVD, no Carotid bruit, FROM Cardiovascular: RRR, no m/r/g, no S3/S4, chest wall nontender, good pedal pulses, good femoral pulses, no LE edema. Respiratory: CTA bilaterally, no wheeze, no rales, no ronchi, breath sounds not diminished, breathing not labored, no increased respiratory effort. Abdomen: soft, nontender, nondistended, no mass, bowel sounds present and normoactive, no hepatosplenomegaly. Musculoskeletal: 5/5 muscular strength x4 extremities, no cyanosis/clubbing, no DJD, no kyphosis,  Skin: no rash, no lesions, no erythema. Lymphatic: No adenopathy  (cervical/axilla/inguinal/supraclavicular) Neurologic: Cranial nerves intact, DTR intact, Sensation intact, Babinski sign R/L, no dysarthria, no aphasia, no dysphagia, no contractures Psychiatric: Alert, Oriented (time, person, place, circumstance),  Cooperative, Judgement good, Memory intact, not confused, not agitated, not depressed, cooperative         Labs on Admission:  Basic Metabolic Panel:  Recent Labs Lab 11/11/14 2023  NA 138  K 3.7  CL 103  CO2 26  GLUCOSE 143*  BUN 20  CREATININE 0.98  CALCIUM 8.9   Liver Function Tests:  Recent Labs Lab 11/11/14 2023  AST 28  ALT 16  ALKPHOS 47  BILITOT 1.3*  PROT 6.8  ALBUMIN 4.0   No results for input(s): LIPASE, AMYLASE in the last 168 hours. No results for input(s): AMMONIA in the last 168 hours. CBC:  Recent Labs Lab 11/11/14 2023  WBC 4.7  HGB 14.1  HCT 42.7  MCV 99.1  PLT 138*   Cardiac Enzymes: No results for input(s): CKTOTAL, CKMB, CKMBINDEX, TROPONINI in the last 168 hours. BNP (last 3 results) No results for input(s): BNP in the last 8760 hours.  ProBNP (last 3 results)  No results for input(s): PROBNP in the last 8760 hours.  CBG: No results for input(s): GLUCAP in the last 168 hours.  Radiological Exams on Admission: No results found.  EKG: Independently reviewed. Sinus rhythm with ventricular rate of 68 bpm with premature atrial complexes. Nonspecific ST abnormality.  Assessment/Plan Principal Problem:   UTI (lower urinary tract infection) Active Problems:   Rheumatoid arthritis   Chronic anticoagulation   H/O: CVA (cerebrovascular accident)   Encephalopathy, metabolic   Atrial fibrillation, unspecified  1. UTI. Patient failed oral antibiotic treatment. Admitted to the medical floor. Urine for culture and sensitivity sent. Started on IV Rocephin. Tylenol when necessary. Follow up cultures. 2. Transient confusion secondary to metabolic encephalopathy, resolved currently patient  stable. 3. Chronic anticoagulation. Patient on Xarelto. Stable continue same. 4. History of rheumatoid arthritis. Patient on home medication, stable, continue home medications. 5. History of atrial fibrillation. Patient stable currently in sinus. Monitor. 6. History of CVA with complete recovery. Patient stable. No acute problems. Monitor.   Code Status: Full code. DVT Prophylaxis: Xarelto.  Time spent on admission: 50 minutes.  Betti Cruz, [facility]

## 2014-11-12 NOTE — Progress Notes (Signed)
Initial Nutrition Assessment  DOCUMENTATION CODES:     INTERVENTION:  Ensure Enlive (each supplement provides 350kcal and 20 grams of protein) BID  NUTRITION DIAGNOSIS:  Inadequate oral intake related to acute illness as evidenced by meal completion < 50%, per patient/family report.   GOAL: Energy Intake: goal for pt to eat atleast 75% of meals and atleast one supplement   MONITOR: Energy Intake Electrolyte and Renal Profile Anthropometrics Digestive System  REASON FOR ASSESSMENT:  Consult Assessment of nutrition requirement/status  ASSESSMENT:  Pt admitted with AMS, n/v and UTI. PMHx: CVA, afib, DVT  PO Intake History: pt reports eating 50% of lunch today and reports poor po intake for the past couple of weeks. Pt vague with intake clarification, but reports no appetite.  Medications:  Folic Acid, methotrexate, Protonix  Labs: Electrolyte and Renal Profile:    Recent Labs Lab 11/11/14 2023 11/12/14 0550  BUN 20 18  CREATININE 0.98 0.85  NA 138 139  K 3.7 3.4*  Glucose Profile: No results for input(s): GLUCAP in the last 72 hours.  Protein Profile:  Recent Labs Lab 11/11/14 2023  ALBUMIN 4.0   Height:  Ht Readings from Last 1 Encounters:  11/12/14 5\' 9"  (1.753 m)    Weight:  Wt Readings from Last 1 Encounters:  11/12/14 122 lb 14.4 oz (55.747 kg)    Ideal Body Weight:     Wt Readings from Last 10 Encounters:  11/12/14 122 lb 14.4 oz (55.747 kg)    BMI:  Body mass index is 18.14 kg/(m^2).   Pt reports UBW of 130lbs but does not know when last weighed 130lbs. (6% weight loss from UBW)  Estimated Nutritional Needs:  Kcal:  1447-1710kcals, BEE: 1096kcals  Protein:  56-67g protein (1.0-1.2g/kg)  Fluid:  1393-1631mL of fluid (25-32mL/kg)  Skin:  Reviewed, no issues    Diet Order:  Diet Heart Room service appropriate?: Yes; Fluid consistency:: Thin  EDUCATION NEEDS:  No education needs identified at this time   Intake/Output  Summary (Last 24 hours) at 11/12/14 1639 Last data filed at 11/12/14 1443  Gross per 24 hour  Intake    777 ml  Output    100 ml  Net    677 ml    Last BM:  Not documented  MODERATE Care Level  01/12/15, RD, LDN Pager [phone]

## 2014-11-12 NOTE — Progress Notes (Signed)
ANTIBIOTIC CONSULT NOTE - INITIAL  Pharmacy Consult for Ceftriaxone Indication: UTI  No Known Allergies  Patient Measurements: Height: 5\' 9"  (175.3 cm) Weight: 130 lb (58.968 kg) IBW/kg (Calculated) : 66.2  Vital Signs: Temp: 97.6 F (36.4 C) (05/02 0242) Temp Source: Oral (05/02 0242) BP: 134/67 mmHg (05/02 0242) Pulse Rate: 102 (05/02 0242)    Labs:  Recent Labs  11/11/14 2023  WBC 4.7  HGB 14.1  PLT 138*  CREATININE 0.98   UA: 3+LE, rare bacteria   Estimated Creatinine Clearance: 43.4 mL/min (by C-G formula based on Cr of 0.98).   Microbiology: No results found for this or any previous visit (from the past 720 hour(s)).  Medical History: Past Medical History  Diagnosis Date  . Cerebral vascular accident   . Atrial fibrillation   . Arthritis   . DVT (deep venous thrombosis)   . Dysrhythmia     Medications:  Prescriptions prior to admission  Medication Sig Dispense Refill Last Dose  . atorvastatin (LIPITOR) 10 MG tablet Take 10 mg by mouth daily.     . ciprofloxacin (CIPRO) 500 MG tablet Take 500 mg by mouth daily.     . citalopram (CELEXA) 20 MG tablet Take 20 mg by mouth daily.     01/11/15 doxycycline (ADOXA) 100 MG tablet Take 100 mg by mouth 2 (two) times daily.     [name] esomeprazole (NEXIUM) 20 MG capsule Take 20 mg by mouth daily at 12 noon.     . folic acid (FOLVITE) 1 MG tablet Take 1 mg by mouth daily.   11/11/2014 at Unknown time  . folic acid (FOLVITE) 1 MG tablet Take 1 mg by mouth daily.     01/11/2015 lisinopril (PRINIVIL,ZESTRIL) 10 MG tablet Take 10 mg by mouth daily.     [name] lisinopril (PRINIVIL,ZESTRIL) 5 MG tablet Take 5 mg by mouth daily.     . methotrexate (RHEUMATREX) 2.5 MG tablet Take 12.5 mg by mouth once a week. Caution:Chemotherapy. Protect from light.   Past Week at Unknown time  . methotrexate (RHEUMATREX) 2.5 MG tablet Take 2.5 mg by mouth once a week. Caution:Chemotherapy. Protect from light.     . rivaroxaban (XARELTO) 20 MG TABS tablet Take 20  mg by mouth daily with supper.     . rivaroxaban (XARELTO) 20 MG TABS tablet Take 20 mg by mouth daily.     . simvastatin (ZOCOR) 20 MG tablet Take 20 mg by mouth at bedtime.   11/11/2014 at Unknown time  . clopidogrel (PLAVIX) 75 MG tablet Take 75 mg by mouth daily.   12/03/2011 at Unknown  . metoprolol succinate (TOPROL-XL) 50 MG 24 hr tablet Take 50 mg by mouth daily. Take with or immediately following a meal.   12/02/2011 at 2100   Scheduled:  . aspirin EC  81 mg Oral Daily  . cefTRIAXone (ROCEPHIN)  IV  1 g Intravenous Q24H  . clopidogrel  75 mg Oral Daily  . folic acid  1 mg Oral Daily  . lisinopril  10 mg Oral Daily  . methotrexate  12.5 mg Oral Weekly  . metoprolol succinate  50 mg Oral Daily  . pantoprazole  40 mg Oral Daily  . rivaroxaban  20 mg Oral Q supper  . simvastatin  20 mg Oral QHS  . sodium chloride  3 mL Intravenous Q12H   Infusions:   PRN: acetaminophen **OR** acetaminophen, ondansetron **OR** ondansetron (ZOFRAN) IV, sodium chloride Anti-infectives    Start     Dose/Rate  Route Frequency Ordered Stop   11/12/14 1800  cefTRIAXone (ROCEPHIN) 1 g in dextrose 5 % 50 mL IVPB - Premix     1 g 100 mL/hr over 30 Minutes Intravenous Every 24 hours 11/12/14 0246     11/11/14 2345  cefTRIAXone (ROCEPHIN) 1 g in dextrose 5 % 50 mL IVPB     1 g 100 mL/hr over 30 Minutes Intravenous  Once 11/11/14 2332 11/12/14 0033     Assessment: Patient admitted for treatment of UTI after failing outpatient treatment with cipro/doxycycline. Patient with AMS and abnormal UA. Received ceftriaxone 1gm x 1 in ED  Plan:  Ordered ceftriaxone 1gm IV Q24H  Follow up culture results   Pharmacy to follow per consult  Garlon Hatchet, PharmD   11/12/2014,2:46 AM

## 2014-11-12 NOTE — ED Notes (Signed)
Admitting MD at bedside.

## 2014-11-12 NOTE — Progress Notes (Signed)
Kelly Wright, is a 81 y.o. female, DOB - [birth year], YTW:[id number]  Admit date - 11/11/2014   Admitting Physician Vernell Barrier, MD  Outpatient Primary MD for the patient is Leanna Sato, MD  LOS - 0   Chief Complaint  Patient presents with  . Altered Mental Status    Pt presents to ER with family. Family reports AMS, currently undergoing treatment for UTI. Pt currently alert and oriented.         Subjective:   Kelly Wright  Admitted for generalized weakness/AMS/vomiting.found to have UTI.failed out pt therapy. She feels  Better now.no  More vomiting's.  Procedures none   Consults  *none   Medications  Scheduled Meds: . aspirin EC  81 mg Oral Daily  . cefTRIAXone (ROCEPHIN)  IV  1 g Intravenous Q24H  . clopidogrel  75 mg Oral Daily  . folic acid  1 mg Oral Daily  . lisinopril  10 mg Oral Daily  . [START ON 11/14/2014] methotrexate  12.5 mg Oral Weekly  . metoprolol succinate  50 mg Oral Daily  . pantoprazole  40 mg Oral Daily  . rivaroxaban  20 mg Oral Q supper  . simvastatin  20 mg Oral QHS  . sodium chloride  3 mL Intravenous Q12H   Continuous Infusions:  PRN Meds:.acetaminophen **OR** acetaminophen, ondansetron **OR** ondansetron (ZOFRAN) IV, sodium chloride  DVT Prophylaxis Xarelto Lab Results  Component Value Date   PLT 122* 11/12/2014    Antibiotics    Anti-infectives    Start     Dose/Rate Route Frequency Ordered Stop   11/12/14 1800  cefTRIAXone (ROCEPHIN) 1 g in dextrose 5 % 50 mL IVPB - Premix     1 g 100 mL/hr over 30 Minutes Intravenous Every 24 hours 11/12/14 0246     11/11/14 2345  cefTRIAXone (ROCEPHIN) 1 g in dextrose 5 % 50 mL IVPB     1 g 100 mL/hr over 30 Minutes Intravenous  Once 11/11/14 2332 11/12/14 0033          Objective:   Filed Vitals:   11/12/14  0200 11/12/14 0226 11/12/14 0242 11/12/14 0736  BP: 126/68  134/67 103/47  Pulse:   102 62  Temp:  98.1 F (36.7 C) 97.6 F (36.4 C) 97.6 F (36.4 C)  TempSrc:  Oral Oral Oral  Resp:   16 18  Height:   5\' 9"  (1.753 m)   Weight:   55.747 kg (122 lb 14.4 oz)   SpO2:   99% 100%    Wt Readings from Last 3 Encounters:  11/12/14 55.747 kg (122 lb 14.4 oz)     Intake/Output Summary (Last 24 hours) at 11/12/14 1306 Last data filed at 11/12/14 0900  Gross per 24 hour  Intake    777 ml  Output      0 ml  Net    777 ml     Physical Exam  Awake Alert, Oriented X 3, No new F.N deficits, Normal affect Kelly Wright,Kelly Wright Supple Neck,No JVD, No cervical lymphadenopathy appriciated.  Symmetrical Chest wall movement, Good air movement bilaterally, CTAB RRR,No Gallops,Rubs or new Murmurs, No Parasternal Heave +ve B.Sounds, Abd Soft, No tenderness, No organomegaly appriciated, No rebound - guarding or rigidity. No Cyanosis, Clubbing or edema, No new  Rash or bruise     Data Review   Micro Results No results found for this or any previous visit (from the past 240 hour(s)).  Radiology Reports No results found.   CBC  Recent Labs Lab 11/11/14 2023 11/12/14 0550  WBC 4.7 4.8  HGB 14.1 12.8  HCT 42.7 39.0  PLT 138* 122*  MCV 99.1 99.4  MCH 32.8 32.6  MCHC 33.1 32.8  RDW 14.9* 15.1*    Chemistries   Recent Labs Lab 11/11/14 2023 11/12/14 0550  NA 138 139  K 3.7 3.4*  CL 103 107  CO2 26 26  GLUCOSE 143* 104*  BUN 20 18  CREATININE 0.98 0.85  CALCIUM 8.9 8.3*  AST 28  --   ALT 16  --   ALKPHOS 47  --   BILITOT 1.3*  --    ------------------------------------------------------------------------------------------------------------------ estimated creatinine clearance is 47.2 mL/min (by C-G formula based on Cr of 0.85). ------------------------------------------------------------------------------------------------------------------ No results for input(s): HGBA1C in  the last 72 hours. ------------------------------------------------------------------------------------------------------------------ No results for input(s): CHOL, HDL, LDLCALC, TRIG, CHOLHDL, LDLDIRECT in the last 72 hours. ------------------------------------------------------------------------------------------------------------------ No results for input(s): TSH, T4TOTAL, T3FREE, THYROIDAB in the last 72 hours.  Invalid input(s): FREET3 ------------------------------------------------------------------------------------------------------------------ No results for input(s): VITAMINB12, FOLATE, FERRITIN, TIBC, IRON, RETICCTPCT in the last 72 hours.  Coagulation profile  Recent Labs Lab 11/11/14 2023  INR 1.23    No results for input(s): DDIMER in the last 72 hours.  Cardiac Enzymes No results for input(s): CKMB, TROPONINI, MYOGLOBIN in the last 168 hours.  Invalid input(s): CK ------------------------------------------------------------------------------------------------------------------ Invalid input(s): POCBNP   Assessment & Plan    Problem list:  Patient Active Problem List   Diagnosis Date Noted  . UTI (lower urinary tract infection) 11/12/2014  . Encephalopathy, metabolic 11/12/2014  . Rheumatoid arthritis 11/12/2014  . Chronic anticoagulation 11/12/2014  . Atrial fibrillation, unspecified 11/12/2014  . H/O: CVA (cerebrovascular accident) 11/12/2014    Principal Problem:   UTI (lower urinary tract infection) Active Problems:   Encephalopathy, metabolic   Rheumatoid arthritis   Chronic anticoagulation   Atrial fibrillation, unspecified   H/O: CVA (cerebrovascular accident)  For UTI;continue Rocephin,follow cultures. 2.encephalopathy;Metabolic;resolved 3.RA;continue home med's 4.h/o DVT;on xarelto H/o CVA;on Plavix;not sure why she is on  Both,will d/w pts family  Code Status: full  Family Communication:d/w pt  Disposition Plan:  home     Time Spent in minutes   35 min   Lissett Favorite M.D on 11/12/2014 at 1:06 PM

## 2014-11-13 LAB — URINE CULTURE: CULTURE: NO GROWTH

## 2014-11-13 LAB — CREATININE, SERUM
Creatinine, Ser: 1 mg/dL (ref 0.44–1.00)
GFR calc Af Amer: >60 mL/min (ref >60)
GFR calc non Af Amer: 52 mL/min — ABNORMAL LOW (ref >60)

## 2014-11-13 NOTE — Progress Notes (Signed)
Kelly Wright, is a 81 y.o. female, DOB - [birth year], LNL:[id number]  Admit date - 11/11/2014   Admitting Physician Crissie Figures, MD  Outpatient Primary MD for the patient is Leanna Sato, MD  LOS - 1   Chief Complaint  Patient presents with  . Altered Mental Status    Pt presents to ER with family. Family reports AMS, currently undergoing treatment for UTI. Pt currently alert and oriented.         Subjective:   Kelly Wright  Admitted for generalized weakness/AMS/vomiting.found to have UTI.failed out pt therapy. On rocephin.urine cultures are pending.has bradycardia with HR 58 bpm.so I stopped metoprolol t.has been having some dizziness(chronic).  Procedures none   Consults  *none   Medications  Scheduled Meds: . aspirin EC  81 mg Oral Daily  . cefTRIAXone (ROCEPHIN)  IV  1 g Intravenous Q24H  . clopidogrel  75 mg Oral Daily  . feeding supplement (ENSURE ENLIVE)  237 mL Oral BID WC  . folic acid  1 mg Oral Daily  . lisinopril  10 mg Oral Daily  . [START ON 11/14/2014] methotrexate  12.5 mg Oral Weekly  . pantoprazole  40 mg Oral Daily  . rivaroxaban  20 mg Oral Q supper  . simvastatin  20 mg Oral QHS  . sodium chloride  3 mL Intravenous Q12H   Continuous Infusions:  PRN Meds:.acetaminophen **OR** acetaminophen, [DISCONTINUED] ondansetron **OR** ondansetron (ZOFRAN) IV, sodium chloride  DVT Prophylaxis Xarelto Lab Results  Component Value Date   PLT 122* 11/12/2014    Antibiotics    Anti-infectives    Start     Dose/Rate Route Frequency Ordered Stop   11/12/14 1800  cefTRIAXone (ROCEPHIN) 1 g in dextrose 5 % 50 mL IVPB - Premix     1 g 100 mL/hr over 30 Minutes Intravenous Every 24 hours 11/12/14 0246     11/11/14 2345  cefTRIAXone (ROCEPHIN) 1 g in dextrose 5 % 50 mL IVPB     1 g 100  mL/hr over 30 Minutes Intravenous  Once 11/11/14 2332 11/12/14 0033          Objective:   Filed Vitals:   11/12/14 1605 11/13/14 0108 11/13/14 0858 11/13/14 0859  BP: 99/40 90/53 118/48   Pulse: 47 55  45  Temp: 98 F (36.7 C) 97.9 F (36.6 C) 98.2 F (36.8 C)   TempSrc: Oral Oral Oral   Resp: 18 18 18    Height:      Weight:  57.743 kg (127 lb 4.8 oz)    SpO2: 99% 99%  99%    Wt Readings from Last 3 Encounters:  11/13/14 57.743 kg (127 lb 4.8 oz)     Intake/Output Summary (Last 24 hours) at 11/13/14 1137 Last data filed at 11/13/14 1003  Gross per 24 hour  Intake    168 ml  Output    475 ml  Net   -307 ml     Physical Exam  Awake Alert, Oriented X 3, No new F.N deficits, Normal affect [place].AT,PERRAL Supple Neck,No JVD, No cervical lymphadenopathy appriciated.  Symmetrical Chest wall movement, Good air movement bilaterally, CTAB RRR,No Gallops,Rubs or new Murmurs, No Parasternal Heave +ve B.Sounds, Abd Soft, No tenderness, No organomegaly appriciated, No rebound - guarding or  rigidity. No Cyanosis, Clubbing or edema, No new Rash or bruise     Data Review   Micro Results No results found for this or any previous visit (from the past 240 hour(s)).  Radiology Reports No results found.   CBC  Recent Labs Lab 11/11/14 2023 11/12/14 0550  WBC 4.7 4.8  HGB 14.1 12.8  HCT 42.7 39.0  PLT 138* 122*  MCV 99.1 99.4  MCH 32.8 32.6  MCHC 33.1 32.8  RDW 14.9* 15.1*    Chemistries   Recent Labs Lab 11/11/14 2023 11/12/14 0550 11/13/14 0508  NA 138 139  --   K 3.7 3.4*  --   CL 103 107  --   CO2 26 26  --   GLUCOSE 143* 104*  --   BUN 20 18  --   CREATININE 0.98 0.85 1.00  CALCIUM 8.9 8.3*  --   AST 28  --   --   ALT 16  --   --   ALKPHOS 47  --   --   BILITOT 1.3*  --   --    ------------------------------------------------------------------------------------------------------------------ estimated creatinine clearance is 41.6 mL/min (by C-G  formula based on Cr of 1). ------------------------------------------------------------------------------------------------------------------ No results for input(s): HGBA1C in the last 72 hours. ------------------------------------------------------------------------------------------------------------------ No results for input(s): CHOL, HDL, LDLCALC, TRIG, CHOLHDL, LDLDIRECT in the last 72 hours. ------------------------------------------------------------------------------------------------------------------ No results for input(s): TSH, T4TOTAL, T3FREE, THYROIDAB in the last 72 hours.  Invalid input(s): FREET3 ------------------------------------------------------------------------------------------------------------------ No results for input(s): VITAMINB12, FOLATE, FERRITIN, TIBC, IRON, RETICCTPCT in the last 72 hours.  Coagulation profile  Recent Labs Lab 11/11/14 2023  INR 1.23    No results for input(s): DDIMER in the last 72 hours.  Cardiac Enzymes No results for input(s): CKMB, TROPONINI, MYOGLOBIN in the last 168 hours.  Invalid input(s): CK ------------------------------------------------------------------------------------------------------------------ Invalid input(s): POCBNP   Assessment & Plan    Problem list:  Patient Active Problem List   Diagnosis Date Noted  . UTI (lower urinary tract infection) 11/12/2014  . Encephalopathy, metabolic 11/12/2014  . Rheumatoid arthritis 11/12/2014  . Chronic anticoagulation 11/12/2014  . Atrial fibrillation, unspecified 11/12/2014  . H/O: CVA (cerebrovascular accident) 11/12/2014    Principal Problem:   UTI (lower urinary tract infection) Active Problems:   Encephalopathy, metabolic   Rheumatoid arthritis   Chronic anticoagulation   Atrial fibrillation, unspecified   H/O: CVA (cerebrovascular accident)  For UTI;continue Rocephin,follow cultures.continue IV  abx; 2.encephalopathy;Metabolic;resolved 3.RA;continue home med's 4.h/o DVT;on xarelto,d/c plavix as she is already on xarelto Bradycardia;hold bblocker H/o CVA;continue xarelto D/c plavix  Code Status: full  Family Communication:d/w pt  Disposition Plan: home     Time Spent in minutes   35 min   Apryl Brymer M.D on 11/13/2014 at 11:37 AM

## 2014-11-13 NOTE — Progress Notes (Signed)
Pt VSS. A&O. Verbal report given to Anabella, RN. Due to unit census and discharges, pt was given to different RN.

## 2014-11-13 NOTE — Evaluation (Addendum)
Physical Therapy Evaluation Patient Details Name: Kelly Wright MRN: [id number] DOB: [birth year] Today's Date: 11/13/2014   History of Present Illness  Pt is a pleasant 81 year old female who was admitted for generalized weakness with vomiting and confusion. Pt now with acute UTI. Pt currently lives alone and independent prior to admission. Pt with history of UTI, RA, Afib, CVA, and chronic anticoagulation.  Clinical Impression  Pt performs bed mobility with supervision and transfers/ambulation with cga and no AD. Pt demonstrates slight balance deficits as well as decreased mobility/strength. Pt noted with decreased power evidenced by increased time on 5TSTS test. All mobility performed on room air with sats WNL. Would benefit from skilled PT to address above deficits and promote optimal return to PLOF, recommend transition to home with HHPT vs OP PT upon discharge from acute hospitalization.      Follow Up Recommendations Other (comment) (HHPT vs OP dependent on home bound status-discussed with CM)    Equipment Recommendations  Other (comment) (pt has rw)    Recommendations for Other Services       Precautions / Restrictions Precautions Precautions: Fall Restrictions Weight Bearing Restrictions: No      Mobility  Bed Mobility Overal bed mobility: Modified Independent             General bed mobility comments: Cues for correct technique  Transfers Overall transfer level: Needs assistance Equipment used: None Transfers: Sit to/from Stand Sit to Stand: Min guard         General transfer comment: sit<>stand with no AD with cues for upright posture. Slight post leaning noted, however pt able to self correct.  Ambulation/Gait Ambulation/Gait assistance: Min guard Ambulation Distance (Feet): 190 Feet Assistive device: None Gait Pattern/deviations: Step-through pattern        Stairs            Wheelchair Mobility    Modified Rankin (Stroke Patients Only)        Balance                                 Standardized Balance Assessment Standardized Balance Assessment :  (5x STS- 20seconds demonstrating decreased power)           Pertinent Vitals/Pain Pain Assessment: No/denies pain    Home Living Family/patient expects to be discharged to:: Private residence Living Arrangements: Alone Available Help at Discharge: Family Type of Home: House Home Access: Stairs to enter   Secretary/administrator of Steps: 1 Home Layout: One level Home Equipment: Environmental consultant - 2 wheels      Prior Function Level of Independence: Independent               Hand Dominance        Extremity/Trunk Assessment   Upper Extremity Assessment: Overall WFL for tasks assessed           Lower Extremity Assessment: Generalized weakness         Communication   Communication: No difficulties  Cognition Arousal/Alertness: Awake/alert Behavior During Therapy: WFL for tasks assessed/performed Overall Cognitive Status: Within Functional Limits for tasks assessed                      General Comments      Exercises Other Exercises Other Exercises: Pt performed seated ther-ex including B LE alt. marching, hip abd/add, and LAQ x 15 reps. HEP reviewed with pt and written on board.  Assessment/Plan    PT Assessment Patient needs continued PT services  PT Diagnosis Difficulty walking;Generalized weakness   PT Problem List Decreased strength;Decreased balance  PT Treatment Interventions Gait training;Therapeutic exercise;Balance training   PT Goals (Current goals can be found in the Care Plan section) Acute Rehab PT Goals Patient Stated Goal: to go home PT Goal Formulation: With patient Time For Goal Achievement: 11/27/14 Potential to Achieve Goals: Good    Frequency Min 2X/week   Barriers to discharge        Co-evaluation               End of Session Equipment Utilized During Treatment: Gait  belt Activity Tolerance: Patient tolerated treatment well Patient left: in chair;with chair alarm set Nurse Communication: Mobility status         Time: 1400-1419 PT Time Calculation (min) (ACUTE ONLY): 19 min   Charges:   PT Evaluation $Initial PT Evaluation Tier I: 1 Procedure PT Treatments $Therapeutic Exercise: 8-22 mins   PT G Codes:        Elyssa Pendelton 12/13/14, 4:43 PM

## 2014-11-14 NOTE — Care Management Note (Signed)
Case Management Note  Patient Details  Name: Kelly Wright MRN: [id number] Date of Birth: [birth year]  Subjective/Objective:                  Spoke with patient ho is alert and oriented today. She stated that she lives alone but that her adult son lives "just down the hill" and helps her. She was independent prior to coming to hospital and stated that she drives self. Stated that she thinks she has a Environmental consultant at home. Will attempt to call family and confirm plan.   Action/Plan:   Expected Discharge Date:                  Expected Discharge Plan:  Home w Home Health Services  In-House Referral:     Discharge planning Services  CM Consult  Post Acute Care Choice:  Home Health, Durable Medical Equipment Choice offered to:  Patient  DME Arranged:    DME Agency:  [facility]  HH Arranged:  PT [facility] Agency:  [facility]  Status of Service:  In process, will continue to follow  Medicare Important Message Given:    Date Medicare IM Given:    Medicare IM give by:    Date Additional Medicare IM Given:    Additional Medicare Important Message give by:     If discussed at Long Length of Stay Meetings, dates discussed:    Additional Comments:  Adonis Huguenin, RN 11/14/2014, 11:16 AM

## 2014-11-14 NOTE — Progress Notes (Signed)
Notified Dr. Luberta Mutter of low BP, hold AM does of lisinopril.

## 2014-11-14 NOTE — Progress Notes (Signed)
Kelly Wright, is a 81 y.o. female, DOB - 02-14-1933, ESP:[id number]  Admit date - 11/11/2014   Admitting Physician Crissie Figures, MD  Outpatient Primary MD for the patient is Leanna Sato, MD  LOS - 2   Chief Complaint  Patient presents with  . Altered Mental Status    Pt presents to ER with family. Family reports AMS, currently undergoing treatment for UTI. Pt currently alert and oriented.         Subjective:   Kelly Wright  Admitted for generalized weakness/AMS/vomiting.found to have UTI.failed out pt therapy. On rocephin.urine cultures  Negative.hypotensive/bradycardic.hold metoprolol,lisinopril. Pt has no nausea. Procedures none   Consults  PT   Medications  Scheduled Meds: . aspirin EC  81 mg Oral Daily  . cefTRIAXone (ROCEPHIN)  IV  1 g Intravenous Q24H  . feeding supplement (ENSURE ENLIVE)  237 mL Oral BID WC  . folic acid  1 mg Oral Daily  . lisinopril  10 mg Oral Daily  . methotrexate  12.5 mg Oral Weekly  . pantoprazole  40 mg Oral Daily  . rivaroxaban  20 mg Oral Q supper  . simvastatin  20 mg Oral QHS  . sodium chloride  3 mL Intravenous Q12H   Continuous Infusions:  PRN Meds:.acetaminophen **OR** acetaminophen, [DISCONTINUED] ondansetron **OR** ondansetron (ZOFRAN) IV, sodium chloride  DVT Prophylaxis Xarelto Lab Results  Component Value Date   PLT 122* 11/12/2014    Antibiotics    Anti-infectives    Start     Dose/Rate Route Frequency Ordered Stop   11/12/14 1800  cefTRIAXone (ROCEPHIN) 1 g in dextrose 5 % 50 mL IVPB - Premix     1 g 100 mL/hr over 30 Minutes Intravenous Every 24 hours 11/12/14 0246     11/11/14 2345  cefTRIAXone (ROCEPHIN) 1 g in dextrose 5 % 50 mL IVPB     1 g 100 mL/hr over 30 Minutes Intravenous  Once 11/11/14 2332 11/12/14 0033           Objective:   Filed Vitals:   11/14/14 0923 11/14/14 0942 11/14/14 1137 11/14/14 1140  BP: 97/40 90/60 92/39  90/55  Pulse: 49     Temp: 98.7 F (37.1 C)     TempSrc: Oral     Resp: 19     Height:      Weight:      SpO2: 100%       Wt Readings from Last 3 Encounters:  11/14/14 57.108 kg (125 lb 14.4 oz)     Intake/Output Summary (Last 24 hours) at 11/14/14 1219 Last data filed at 11/14/14 0925  Gross per 24 hour  Intake    418 ml  Output      0 ml  Net    418 ml     Physical Exam  Awake Alert, Oriented X 3, No new F.N deficits, Normal affect [place].AT,PERRAL Supple Neck,No JVD, No cervical lymphadenopathy appriciated.  Symmetrical Chest wall movement, Good air movement bilaterally, CTAB RRR,No Gallops,Rubs or new Murmurs, No Parasternal Heave +ve B.Sounds, Abd Soft, No tenderness, No organomegaly appriciated, No rebound - guarding or rigidity. No Cyanosis, Clubbing or edema, No new Rash or bruise     Data Review   Micro Results Recent Results (from the past 240 hour(s))  Urine culture     Status: None   Collection Time: 11/12/14 12:00 PM  Result Value Ref Range Status   Specimen Description URINE, CLEAN CATCH  Final   Special Requests NONE  Final   Culture NO GROWTH 1 DAY  Final   Report Status 11/13/2014 FINAL  Final    Radiology Reports No results found.   CBC  Recent Labs Lab 11/11/14 2023 11/12/14 0550  WBC 4.7 4.8  HGB 14.1 12.8  HCT 42.7 39.0  PLT 138* 122*  MCV 99.1 99.4  MCH 32.8 32.6  MCHC 33.1 32.8  RDW 14.9* 15.1*    Chemistries   Recent Labs Lab 11/11/14 2023 11/12/14 0550 11/13/14 0508  NA 138 139  --   K 3.7 3.4*  --   CL 103 107  --   CO2 26 26  --   GLUCOSE 143* 104*  --   BUN 20 18  --   CREATININE 0.98 0.85 1.00  CALCIUM 8.9 8.3*  --   AST 28  --   --   ALT 16  --   --   ALKPHOS 47  --   --   BILITOT 1.3*  --   --     ------------------------------------------------------------------------------------------------------------------ estimated creatinine clearance is 41.1 mL/min (by C-G formula based on Cr of 1). ------------------------------------------------------------------------------------------------------------------ No results for input(s): HGBA1C in the last 72 hours. ------------------------------------------------------------------------------------------------------------------ No results for input(s): CHOL, HDL, LDLCALC, TRIG, CHOLHDL, LDLDIRECT in the last 72 hours. ------------------------------------------------------------------------------------------------------------------ No results for input(s): TSH, T4TOTAL, T3FREE, THYROIDAB in the last 72 hours.  Invalid input(s): FREET3 ------------------------------------------------------------------------------------------------------------------ No results for input(s): VITAMINB12, FOLATE, FERRITIN, TIBC, IRON, RETICCTPCT in the last 72 hours.  Coagulation profile  Recent Labs Lab 11/11/14 2023  INR 1.23    No results for input(s): DDIMER in the last 72 hours.  Cardiac Enzymes No results for input(s): CKMB, TROPONINI, MYOGLOBIN in the last 168 hours.  Invalid input(s): CK ------------------------------------------------------------------------------------------------------------------ Invalid input(s): POCBNP   Assessment & Plan    Problem list:  Patient Active Problem List   Diagnosis Date Noted  . UTI (lower urinary tract infection) 11/12/2014  . Encephalopathy, metabolic 11/12/2014  . Rheumatoid arthritis 11/12/2014  . Chronic anticoagulation 11/12/2014  . Atrial fibrillation, unspecified 11/12/2014  . H/O: CVA (cerebrovascular accident) 11/12/2014    Principal Problem:   UTI (lower urinary tract infection) Active Problems:   Encephalopathy, metabolic   Rheumatoid arthritis   Chronic anticoagulation   Atrial  fibrillation, unspecified   H/O: CVA (cerebrovascular accident)  For UTI;continue Rocephin, urine cultures negative,2.encephalopathy;Metabolic;resolved 3.RA;continue home med's 4.h/o DVT;on xarelto,d/c plavix as she is already on xarelto Bradycardia;hold bblocker H/o CVA;continue xarelto D/c plavix   Code Status: full  Family Communication:d/w pt  Disposition Plan: home     Time Spent in minutes   35 min   Suriyah Vergara M.D on 11/14/2014 at 12:19 PM

## 2014-11-15 ENCOUNTER — Inpatient Hospital Stay: Payer: Medicare Other

## 2014-11-15 LAB — CREATININE, SERUM
Creatinine, Ser: 0.88 mg/dL (ref 0.44–1.00)
GFR calc Af Amer: >60 mL/min (ref >60)

## 2014-11-15 MED ORDER — LORATADINE 10 MG PO TABS
10.0000 mg | ORAL_TABLET | Freq: Every day | ORAL | Status: DC
  Administered 2014-11-15 – 2014-11-16 (×2): 10 mg via ORAL
  Filled 2014-11-15 (×2): qty 1

## 2014-11-15 NOTE — Progress Notes (Signed)
Per Dr. Luberta Mutter put in an order for claritin

## 2014-11-15 NOTE — Progress Notes (Addendum)
Physical Therapy Treatment Patient Details Name: Kelly Wright MRN: [id number] DOB: [birth year] Today's Date: 11/15/2014    History of Present Illness Pt is a pleasant 81 year old female who was admitted for generalized weakness with vomiting and confusion. Pt now with acute UTI. Pt currently lives alone and independent prior to admission. Pt with history of UTI, RA, Afib, CVA, and chronic anticoagulation.    PT Comments    Pt is making good progress towards goals with increased gait speed noted during gait training. Pt with improved balance during ambulation with decreased cues required for correct technique. Good endurance with there-ex. Pt performed 5 TSTS in 24 seconds demonstrating decreased power. Cues given for correct technique. Pt appears to be slightly confused to situation this date. CM notified.  Follow Up Recommendations  Home health PT     Equipment Recommendations  Other (comment)    Recommendations for Other Services       Precautions / Restrictions Precautions Precautions: Fall Restrictions Weight Bearing Restrictions: No    Mobility  Bed Mobility               General bed mobility comments: not assessed, pt in recliner  Transfers Overall transfer level: Needs assistance Equipment used: None Transfers: Sit to/from Stand Sit to Stand: Min guard         General transfer comment: sit<>stand with no AD with cues for upright posture. Slight post leaning noted, however pt able to self correct.  Ambulation/Gait Ambulation/Gait assistance: Min guard Ambulation Distance (Feet): 210 Feet Assistive device: None       General Gait Details: Pt demonstrates reciprocal gait pattern with slight increased gait speed this session. Pt with no SOB symptoms, however fatigues easily with increased distance. No LOB noted this session   Stairs            Wheelchair Mobility    Modified Rankin (Stroke Patients Only)       Balance                                     Cognition Arousal/Alertness: Awake/alert Behavior During Therapy: WFL for tasks assessed/performed Overall Cognitive Status: Within Functional Limits for tasks assessed                      Exercises Other Exercises Other Exercises: Pt performed seated ther-ex including 12 reps of B LE alt. marching, LAQ, hip add squeezes, hip abd/add, SAQ, and B UE shoulder flexion and scap squeezes. All ther-ex performed with supervision and cues for correct technique.    General Comments        Pertinent Vitals/Pain Pain Assessment: No/denies pain    Home Living                      Prior Function            PT Goals (current goals can now be found in the care plan section) Acute Rehab PT Goals Patient Stated Goal: to go home PT Goal Formulation: With patient Time For Goal Achievement: 11/27/14 Potential to Achieve Goals: Good Progress towards PT goals: Progressing toward goals    Frequency  Min 2X/week    PT Plan Current plan remains appropriate    Co-evaluation             End of Session Equipment Utilized During Treatment: Gait belt Activity Tolerance: Patient tolerated  treatment well Patient left: in chair;with chair alarm set     Time: 1355-1408 PT Time Calculation (min) (ACUTE ONLY): 13 min  Charges:  $Gait Training: 8-22 mins                    G Codes:      Deadrian Toya 11-25-14, 2:46 PM

## 2014-11-15 NOTE — Care Management Note (Signed)
Case Management Note  Patient Details  Name: TIMIKA MUENCH MRN: [id number] Date of Birth: 07-Feb-1933  Subjective/Objective:                   Spoke with the son Deriana Vanderhoef [phone] concerning home environment and discharge planning. He stated that he lives next door to patient and both he and his spouse take care of the patient. They have a care giver that comes to the home for 3 hours a day, 5 days a week. Her other daughter Nila Nephew [phone] is a Engineer, civil (consulting) at [employer]. Marcial Pacas stated that she has short term memory loss from an old stroke. Patient is opened to [facility].  Action/Plan:   Expected Discharge Date:                  Expected Discharge Plan:  Home w Home Health Services  In-House Referral:     Discharge planning Services  CM Consult  Post Acute Care Choice:  Home Health, Durable Medical Equipment Choice offered to:  Patient  DME Arranged:    DME Agency:  [facility]  HH Arranged:  PT, RN, Nurse's Aide HH Agency:  [facility]  Status of Service:  In process, will continue to follow  Medicare Important Message Given:    Date Medicare IM Given:    Medicare IM give by:    Date Additional Medicare IM Given:    Additional Medicare Important Message give by:     If discussed at Long Length of Stay Meetings, dates discussed:    Additional Comments:  Adonis Huguenin, RN 11/15/2014, 11:13 AM

## 2014-11-15 NOTE — Progress Notes (Signed)
Patient ambulated in hallway without walker. Plan for home health with PT Aid Nurse. Discussed care with adult son Marcial Pacas Manninen via telephone.

## 2014-11-15 NOTE — Consult Note (Signed)
Kelly Wright is a 81 y.o. female  [id number]  Referring Physician: Dr. Betti Cruz Primary Physician: Darreld Mclean  Primary Cardiologist: Adrian Blackwater Reason for Consultation: Bradycardia  HPI: This is a 81 year old white female with a past medical history of coronary artery disease atrial fibrillation presented to the emergency room with nausea vomiting weakness and confusion. Patient is feeling much better she is more alert and oriented. She denies any chest pain or shortness of breath or dizziness. When she came in as she was having dizziness weakness and confusion. At this time she is feeling much better. Patient on arrival had 6 rhythm which was sinus but was sinus bradycardia heart rate of around 40 bpm. She was on metoprolol at that time which was withheld. She she's also been on Xarelto and Plavix have X was withheld. He shouldn't apparently had urinary tract infection which been treated with antibiotic and has since then become more alert and oriented.   Review of Systems: No chest pain no shortness of breath no dizziness.    Past Medical History  Diagnosis Date  . Cerebral vascular accident   . Atrial fibrillation   . Arthritis   . DVT (deep venous thrombosis)   . Dysrhythmia     Medications Prior to Admission  Medication Sig Dispense Refill  . atorvastatin (LIPITOR) 10 MG tablet Take 10 mg by mouth daily.    . ciprofloxacin (CIPRO) 500 MG tablet Take 500 mg by mouth daily.    . citalopram (CELEXA) 20 MG tablet Take 20 mg by mouth daily.    [name] doxycycline (ADOXA) 100 MG tablet Take 100 mg by mouth 2 (two) times daily.    [name] esomeprazole (NEXIUM) 20 MG capsule Take 20 mg by mouth daily at 12 noon.    . folic acid (FOLVITE) 1 MG tablet Take 1 mg by mouth daily.    . folic acid (FOLVITE) 1 MG tablet Take 1 mg by mouth daily.    [name] lisinopril (PRINIVIL,ZESTRIL) 10 MG tablet Take 10 mg by mouth daily.    [name] lisinopril (PRINIVIL,ZESTRIL) 5 MG tablet Take 5 mg by mouth daily.    .  methotrexate (RHEUMATREX) 2.5 MG tablet Take 12.5 mg by mouth once a week. Caution:Chemotherapy. Protect from light. Pt takes on Wednesday    . rivaroxaban (XARELTO) 20 MG TABS tablet Take 20 mg by mouth daily with supper.    . rivaroxaban (XARELTO) 20 MG TABS tablet Take 20 mg by mouth daily.    . simvastatin (ZOCOR) 20 MG tablet Take 20 mg by mouth at bedtime.    . clopidogrel (PLAVIX) 75 MG tablet Take 75 mg by mouth daily.    . metoprolol succinate (TOPROL-XL) 50 MG 24 hr tablet Take 50 mg by mouth daily. Take with or immediately following a meal.       . aspirin EC  81 mg Oral Daily  . cefTRIAXone (ROCEPHIN)  IV  1 g Intravenous Q24H  . feeding supplement (ENSURE ENLIVE)  237 mL Oral BID WC  . folic acid  1 mg Oral Daily  . loratadine  10 mg Oral Daily  . methotrexate  12.5 mg Oral Weekly  . pantoprazole  40 mg Oral Daily  . rivaroxaban  20 mg Oral Q supper  . simvastatin  20 mg Oral QHS  . sodium chloride  3 mL Intravenous Q12H    Infusions:    No Known Allergies  History   Social History  . Marital Status: Married  Spouse Name: N/A  . Number of Children: N/A  . Years of Education: N/A   Occupational History  . Not on file.   Social History Main Topics  . Smoking status: Never Smoker   . Smokeless tobacco: Not on file  . Alcohol Use: No  . Drug Use: No  . Sexual Activity: Not on file   Other Topics Concern  . Not on file   Social History Narrative    Family History  Problem Relation Age of Onset  . Hodgkin's lymphoma Mother   . Cancer - Lung Father   . CVA Other     PHYSICAL EXAM:  Filed Vitals:   11/15/14 0830  BP: 104/43  Pulse:   Temp:   Resp:      Intake/Output Summary (Last 24 hours) at 11/15/14 0858 Last data filed at 11/15/14 0832  Gross per 24 hour  Intake    473 ml  Output    700 ml  Net   -227 ml    General:  Well appearing. No respiratory difficulty HEENT: normal Neck: supple. no JVD. Carotids 2+ bilat; no bruits. No  lymphadenopathy or thryomegaly appreciated. Cor: PMI nondisplaced. Regular rate & rhythm. No rubs, gallops or murmurs. Lungs: clear Abdomen: soft, nontender, nondistended. No hepatosplenomegaly. No bruits or masses. Good bowel sounds. Extremities: no cyanosis, clubbing, rash, edema Neuro: alert & oriented x 3, cranial nerves grossly intact. moves all 4 extremities w/o difficulty. Affect pleasant.  ECG: Normal sinus rhythm 68 bpm nonspecific ST-T changes  Results for orders placed or performed during the hospital encounter of 11/11/14 (from the past 24 hour(s))  Creatinine, serum     Status: None   Collection Time: 11/15/14  5:11 AM  Result Value Ref Range   Creatinine, Ser 0.88 0.44 - 1.00 mg/dL   GFR calc non Af Amer >60 >60 mL/min   GFR calc Af Amer >60 >60 mL/min   No results found.   ASSESSMENT AND PLAN: This is 81 year old white female who is very well-known to me with a history of atrial fibrillation and CVA and DVT presented to the hospital with atrial fibrillation. She was in sinus rhythm but had sinus bradycardia heart rate 40/m. Metoprolol was stopped and since then her heart rate has recovered to about 50-55 bpm remaining sinus rhythm. Advise not starting the metoprolol again and advise continuing continuation of Xarelto but discontinuation of Plavix. Will follow the patient closely with you thank you very much for referral  [name]

## 2014-11-15 NOTE — Progress Notes (Signed)
Kelly Wright, is a 81 y.o. female, DOB - Mar 15, 1933, WUG:[id number]  Admit date - 11/11/2014   Admitting Physician Crissie Figures, MD  Outpatient Primary MD for the patient is Leanna Sato, MD  LOS - 3   Chief Complaint  Patient presents with  . Altered Mental Status    Pt presents to ER with family. Family reports AMS, currently undergoing treatment for UTI. Pt currently alert and oriented.         Subjective:   Kelly Wright  Admitted for generalized weakness/AMS/vomiting.found to have UTI.failed out pt therapy.  On rocephin.urine cultures  Negative.seen by cardiology for bradycardia,Echo ordered.   Procedures none    Consults  PT   Medications  Scheduled Meds: . aspirin EC  81 mg Oral Daily  . cefTRIAXone (ROCEPHIN)  IV  1 g Intravenous Q24H  . feeding supplement (ENSURE ENLIVE)  237 mL Oral BID WC  . folic acid  1 mg Oral Daily  . loratadine  10 mg Oral Daily  . methotrexate  12.5 mg Oral Weekly  . pantoprazole  40 mg Oral Daily  . rivaroxaban  20 mg Oral Q supper  . simvastatin  20 mg Oral QHS  . sodium chloride  3 mL Intravenous Q12H   Continuous Infusions:  PRN Meds:.  DVT Prophylaxis Xarelto Lab Results  Component Value Date   PLT 122* 11/12/2014    Antibiotics    Anti-infectives    Start     Dose/Rate Route Frequency Ordered Stop   11/12/14 1800  cefTRIAXone (ROCEPHIN) 1 g in dextrose 5 % 50 mL IVPB - Premix     1 g 100 mL/hr over 30 Minutes Intravenous Every 24 hours 11/12/14 0246     11/11/14 2345  cefTRIAXone (ROCEPHIN) 1 g in dextrose 5 % 50 mL IVPB     1 g 100 mL/hr over 30 Minutes Intravenous  Once 11/11/14 2332 11/12/14 0033          Objective:   Filed Vitals:   11/15/14 0004 11/15/14 0417 11/15/14 0828 11/15/14 0830  BP: 100/43  107/38 104/43  Pulse: 59  50    Temp: 98 F (36.7 C)  97.9 F (36.6 C)   TempSrc: Oral  Oral   Resp: 18  17   Height:      Weight:  58.242 kg (128 lb 6.4 oz)    SpO2: 98%  98%     Wt Readings from Last 3 Encounters:  11/15/14 58.242 kg (128 lb 6.4 oz)     Intake/Output Summary (Last 24 hours) at 11/15/14 1039 Last data filed at 11/15/14 8882  Gross per 24 hour  Intake    593 ml  Output    700 ml  Net   -107 ml     Physical Exam  Awake Alert, Oriented X 3, No new F.N deficits, Normal affect [place].AT,PERRAL Supple Neck,No JVD, No cervical lymphadenopathy appriciated.  Symmetrical Chest wall movement, Good air movement bilaterally, CTAB RRR,No Gallops,Rubs or new Murmurs, No Parasternal Heave +ve B.Sounds, Abd Soft, No tenderness, No organomegaly appriciated, No rebound - guarding or rigidity. No Cyanosis, Clubbing or edema, No new Rash or bruise     Data Review   Micro Results Recent Results (from the past 240 hour(s))  Urine culture     Status: None   Collection Time: 11/12/14 12:00 PM  Result Value Ref Range Status   Specimen Description URINE, CLEAN CATCH  Final   Special Requests NONE  Final   Culture NO GROWTH 1 DAY  Final   Report Status 11/13/2014 FINAL  Final    Radiology Reports No results found.   CBC  Recent Labs Lab 11/11/14 2023 11/12/14 0550  WBC 4.7 4.8  HGB 14.1 12.8  HCT 42.7 39.0  PLT 138* 122*  MCV 99.1 99.4  MCH 32.8 32.6  MCHC 33.1 32.8  RDW 14.9* 15.1*    Chemistries   Recent Labs Lab 11/11/14 2023 11/12/14 0550 11/13/14 0508 11/15/14 0511  NA 138 139  --   --   K 3.7 3.4*  --   --   CL 103 107  --   --   CO2 26 26  --   --   GLUCOSE 143* 104*  --   --   BUN 20 18  --   --   CREATININE 0.98 0.85 1.00 0.88  CALCIUM 8.9 8.3*  --   --   AST 28  --   --   --   ALT 16  --   --   --   ALKPHOS 47  --   --   --   BILITOT 1.3*  --   --   --     ------------------------------------------------------------------------------------------------------------------ estimated creatinine clearance is 47.6 mL/min (by C-G formula based on Cr of 0.88). ------------------------------------------------------------------------------------------------------------------ No results for input(s): HGBA1C in the last 72 hours. ------------------------------------------------------------------------------------------------------------------ No results for input(s): CHOL, HDL, LDLCALC, TRIG, CHOLHDL, LDLDIRECT in the last 72 hours. ------------------------------------------------------------------------------------------------------------------ No results for input(s): TSH, T4TOTAL, T3FREE, THYROIDAB in the last 72 hours.  Invalid input(s): FREET3 ------------------------------------------------------------------------------------------------------------------ No results for input(s): VITAMINB12, FOLATE, FERRITIN, TIBC, IRON, RETICCTPCT in the last 72 hours.  Coagulation profile  Recent Labs Lab 11/11/14 2023  INR 1.23    No results for input(s): DDIMER in the last 72 hours.  Cardiac Enzymes No results for input(s): CKMB, TROPONINI, MYOGLOBIN in the last 168 hours.  Invalid input(s): CK ------------------------------------------------------------------------------------------------------------------ Invalid input(s): POCBNP   Assessment & Plan    Problem list:  Patient Active Problem List   Diagnosis Date Noted  . UTI (lower urinary tract infection) 11/12/2014  . Encephalopathy, metabolic 11/12/2014  . Rheumatoid arthritis 11/12/2014  . Chronic anticoagulation 11/12/2014  . Atrial fibrillation, unspecified 11/12/2014  . H/O: CVA (cerebrovascular accident) 11/12/2014    Principal Problem:   UTI (lower urinary tract infection) Active Problems:   Encephalopathy, metabolic   Rheumatoid arthritis   Chronic anticoagulation    Atrial fibrillation, unspecified   H/O: CVA (cerebrovascular accident)  For UTI;continue Rocephin, urine cultures negative, due to her symptoms of cystitis continue Rocephin while she is here and change toMacrodantin at  discharge. 2.encephalopathy;Metabolic;resolved 3.RA;continue home med's 4.h/o DVT;on xarelto,d/c plavix as she is already on xarelto Bradycardia;hold bblocker seen by cardiology, follow her echocardiogram. H/o CVA;continue xarelto D/c plavix   5. Deconditioning physical therapy recommends home health physical therapy. Possible discharge tomorrow home with home Physical  Therapy.\  6; h/o afib;now with brady;hold metoprolol,continue xarelto  Code Status: full  Family Communication:d/w pt  Disposition Plan: home     Time Spent in minutes   35 min   Ender Rorke M.D on 11/15/2014 at 10:39 AM

## 2014-11-15 NOTE — Progress Notes (Signed)
ANTIBIOTIC CONSULT NOTE - INITIAL  Pharmacy Consult for Ceftriaxone Indication: UTI  No Known Allergies  Patient Measurements: Height: 5\' 9"  (175.3 cm) Weight: 128 lb 6.4 oz (58.242 kg) IBW/kg (Calculated) : 66.2  Vital Signs: Temp: 97.9 F (36.6 C) (05/05 0828) Temp Source: Oral (05/05 0828) BP: 104/43 mmHg (05/05 0830) Pulse Rate: 50 (05/05 0828) Total I/O In: 120 [P.O.:120] Out: 0   Labs:  Recent Labs  11/13/14 0508 11/15/14 0511  CREATININE 1.00 0.88   UA: 3+LE, rare bacteria   Estimated Creatinine Clearance: 47.6 mL/min (by C-G formula based on Cr of 0.88).   Microbiology: Recent Results (from the past 720 hour(s))  Urine culture     Status: None   Collection Time: 11/12/14 12:00 PM  Result Value Ref Range Status   Specimen Description URINE, CLEAN CATCH  Final   Special Requests NONE  Final   Culture NO GROWTH 1 DAY  Final   Report Status 11/13/2014 FINAL  Final    Medical History: Past Medical History  Diagnosis Date  . Cerebral vascular accident   . Atrial fibrillation   . Arthritis   . DVT (deep venous thrombosis)   . Dysrhythmia     Medications:  Prescriptions prior to admission  Medication Sig Dispense Refill Last Dose  . atorvastatin (LIPITOR) 10 MG tablet Take 10 mg by mouth daily.     . ciprofloxacin (CIPRO) 500 MG tablet Take 500 mg by mouth daily.     . citalopram (CELEXA) 20 MG tablet Take 20 mg by mouth daily.     01/13/2015 doxycycline (ADOXA) 100 MG tablet Take 100 mg by mouth 2 (two) times daily.     [name] esomeprazole (NEXIUM) 20 MG capsule Take 20 mg by mouth daily at 12 noon.     . folic acid (FOLVITE) 1 MG tablet Take 1 mg by mouth daily.   11/11/2014 at Unknown time  . folic acid (FOLVITE) 1 MG tablet Take 1 mg by mouth daily.     01/11/2015 lisinopril (PRINIVIL,ZESTRIL) 10 MG tablet Take 10 mg by mouth daily.     [name] lisinopril (PRINIVIL,ZESTRIL) 5 MG tablet Take 5 mg by mouth daily.     . methotrexate (RHEUMATREX) 2.5 MG tablet Take 12.5 mg by  mouth once a week. Caution:Chemotherapy. Protect from light. Pt takes on Wednesday   Past Week at Unknown time  . rivaroxaban (XARELTO) 20 MG TABS tablet Take 20 mg by mouth daily with supper.     . rivaroxaban (XARELTO) 20 MG TABS tablet Take 20 mg by mouth daily.     . simvastatin (ZOCOR) 20 MG tablet Take 20 mg by mouth at bedtime.   11/11/2014 at Unknown time  . clopidogrel (PLAVIX) 75 MG tablet Take 75 mg by mouth daily.   12/03/2011 at Unknown  . metoprolol succinate (TOPROL-XL) 50 MG 24 hr tablet Take 50 mg by mouth daily. Take with or immediately following a meal.   12/02/2011 at 2100   Scheduled:  . aspirin EC  81 mg Oral Daily  . cefTRIAXone (ROCEPHIN)  IV  1 g Intravenous Q24H  . feeding supplement (ENSURE ENLIVE)  237 mL Oral BID WC  . folic acid  1 mg Oral Daily  . methotrexate  12.5 mg Oral Weekly  . pantoprazole  40 mg Oral Daily  . rivaroxaban  20 mg Oral Q supper  . simvastatin  20 mg Oral QHS  . sodium chloride  3 mL Intravenous Q12H   Infusions:  PRN: acetaminophen **OR** acetaminophen, [DISCONTINUED] ondansetron **OR** ondansetron (ZOFRAN) IV, sodium chloride Anti-infectives    Start     Dose/Rate Route Frequency Ordered Stop   11/12/14 1800  cefTRIAXone (ROCEPHIN) 1 g in dextrose 5 % 50 mL IVPB - Premix     1 g 100 mL/hr over 30 Minutes Intravenous Every 24 hours 11/12/14 0246     11/11/14 2345  cefTRIAXone (ROCEPHIN) 1 g in dextrose 5 % 50 mL IVPB     1 g 100 mL/hr over 30 Minutes Intravenous  Once 11/11/14 2332 11/12/14 0033     Assessment: Patient admitted for treatment of UTI after failing outpatient treatment with cipro/doxycycline. Patient with AMS and abnormal UA. Received ceftriaxone 1gm x 1 in ED    Plan:  Ordered ceftriaxone 1gm IV Q24H  Follow up culture results   Pharmacy to follow per consult  Demetrius Charity, PharmD  11/15/2014,8:33 AM

## 2014-11-16 LAB — HEMOGLOBIN: HEMOGLOBIN: 13.1 g/dL (ref 12.0–16.0)

## 2014-11-16 MED ORDER — LORATADINE 10 MG PO TABS: 10.0000 mg | ORAL_TABLET | Freq: Every day | ORAL | Status: DC

## 2014-11-16 MED ORDER — ENSURE ENLIVE PO LIQD: 237.0000 mL | Freq: Two times a day (BID) | ORAL | Status: DC

## 2014-11-16 NOTE — Plan of Care (Signed)
Problem: Phase II Progression Outcomes Goal: Discharge plan established Outcome: Progressing Pt's pulse remains low.  Ambulating throughout shift with standby assistance. No complaints voiced.  Resting quietly

## 2014-11-16 NOTE — Care Management Note (Signed)
Case Management Note  Patient Details  Name: DAVION MEARA MRN: [id number] Date of Birth: April 19, 1933  Subjective/Objective:                   Home today with Home health. Britta Mccreedy taylor at [facility] notified of pending discharge. Spoke with daughter concerning discharge. Daughter asked about REHAB  And I explained that the patient did not meet criteria for rehab as she was ambulating well with PT. Discussed with the daughter Nila Nephew that it may be time for family to get together with PCP and discuss plan for full time sitter or nursing home/assisted living. Patient still forgetful but pleasant.  Action/Plan:  Expected Discharge Date:                  Expected Discharge Plan:  Home w Home Health Services  In-House Referral:     Discharge planning Services  CM Consult  Post Acute Care Choice:  Home Health, Durable Medical Equipment Choice offered to:  Patient, Adult Children  DME Arranged:    DME Agency:  [facility]  HH Arranged:  PT, Nurse's Aide HH Agency:  [facility]  Status of Service:  Completed, signed off  Medicare Important Message Given:    Date Medicare IM Given:    Medicare IM give by:    Date Additional Medicare IM Given:    Additional Medicare Important Message give by:     If discussed at Long Length of Stay Meetings, dates discussed:    Additional Comments:  Adonis Huguenin, RN 11/16/2014, 12:10 PM

## 2014-11-16 NOTE — Progress Notes (Signed)
   SUBJECTIVE: Patient is feeling much better   Filed Vitals:   11/15/14 1622 11/16/14 0131 11/16/14 0600 11/16/14 0742  BP: 108/44 96/41  108/76  Pulse:  62  92  Temp:  97.7 F (36.5 C)  97.9 F (36.6 C)  TempSrc:  Oral  Oral  Resp:  16  19  Height:      Weight:   58.287 kg (128 lb 8 oz)   SpO2:  98%  82%    Intake/Output Summary (Last 24 hours) at 11/16/14 0827 Last data filed at 11/16/14 0742  Gross per 24 hour  Intake   1323 ml  Output    200 ml  Net   1123 ml    LABS: Basic Metabolic Panel:  Recent Labs  87/68/11 0511  CREATININE 0.88   Liver Function Tests: No results for input(s): AST, ALT, ALKPHOS, BILITOT, PROT, ALBUMIN in the last 72 hours. No results for input(s): LIPASE, AMYLASE in the last 72 hours. CBC:  Recent Labs  11/16/14 0432  HGB 13.1   Cardiac Enzymes: No results for input(s): CKTOTAL, CKMB, CKMBINDEX, TROPONINI in the last 72 hours. BNP: Invalid input(s): POCBNP D-Dimer: No results for input(s): DDIMER in the last 72 hours. Hemoglobin A1C: No results for input(s): HGBA1C in the last 72 hours. Fasting Lipid Panel: No results for input(s): CHOL, HDL, LDLCALC, TRIG, CHOLHDL, LDLDIRECT in the last 72 hours. Thyroid Function Tests: No results for input(s): TSH, T4TOTAL, T3FREE, THYROIDAB in the last 72 hours.  Invalid input(s): FREET3 Anemia Panel: No results for input(s): VITAMINB12, FOLATE, FERRITIN, TIBC, IRON, RETICCTPCT in the last 72 hours.   PHYSICAL EXAM General: Well developed, well nourished, in no acute distress HEENT:  Normocephalic and atramatic Neck:  No JVD.  Lungs: Clear bilaterally to auscultation and percussion. Heart: HRRR . Normal S1 and S2 without gallops or murmurs.  Abdomen: Bowel sounds are positive, abdomen soft and non-tender  Msk:  Back normal, normal gait. Normal strength and tone for age. Extremities: No clubbing, cyanosis or edema.   Neuro: Alert and oriented X 3. Psych:  Good affect, responds  appropriately  TELEMETRY: Reviewed telemetry pt in  Sinus bradycardia  ASSESSMENT AND PLAN: Advise continue current medications, and not to restart metoprolol, and plavix. May go home with f/u office in 1 week.  Principal Problem:   UTI (lower urinary tract infection) Active Problems:   Encephalopathy, metabolic   Rheumatoid arthritis   Chronic anticoagulation   Atrial fibrillation, unspecified   H/O: CVA (cerebrovascular accident)    Laurier Nancy, MD, [facility] 11/16/2014 8:27 AM

## 2014-11-30 NOTE — Discharge Summary (Addendum)
Kelly Wright, is a 81 y.o. female  DOB August 22, 1932  MRN [id number].  Admission date:  11/11/2014  Admitting Physician  Crissie Figures, MD  Discharge Date:  11/16/2014   Primary MD  Leanna Sato, MD  Recommendations for primary care physician for things to follow:  Primary doctor in 1 week.  Admission Diagnosis  UTI (lower urinary tract infection) [N39.0]   Discharge Diagnosis  UTI (lower urinary tract infection) [N39.0]   Principal Problem:   UTI (lower urinary tract infection) Active Problems:   Encephalopathy, metabolic   Rheumatoid arthritis   Chronic anticoagulation   Atrial fibrillation, unspecified   H/O: CVA (cerebrovascular accident)      Past Medical History  Diagnosis Date  . Cerebral vascular accident   . Atrial fibrillation   . Arthritis   . DVT (deep venous thrombosis)   . Dysrhythmia     Past Surgical History  Procedure Laterality Date  . Abdominal hysterectomy         History of present illness and  Hospital Course:     Kindly see H&P for history of present illness and admission details, please review complete Labs, Consult reports and Test reports for all details in brief  HPI  from the history and physical done on the day of admission Please look at H&P done by Dr. Betti Cruz for full details. 81 year old female patient brought in by family because of generalized weakness vomiting confusion. Admitted for UTI failed outpatient therapy. Regarding her vomiting she started on Zofran, IV fluids. Patient also started on IV Rocephin. Patient was taking outpatient doxycycline and Cipro for UTI.  Hospital Course  Patient nausea vomiting improved with IV fluids. Urine cultures did not show any growth. Patient received IV Rocephin while she was in the hospital did not require any further  antibiotics. Generalized weakness seen by physical therapy ,they recommended home physical therapy. Chronic atrial fibrillation seen by Dr.Shukat [facility] cardiology and he recommended continuing Xarelto. Patient was bradycardic but asymptomatic. Heart rate as low as 40. Stopped  the metoprolol that she was taking. Heart rate nicely improved off the metoprolol to 60s. Regarding deconditioning seen by physical therapy be arranged home physical therapy. Dementia number at arthritis Seasonal allergies   Discharge Condition:stable   Follow UP   cardiology, primary doctor in 1-2 weeks.   Discharge Instructions  and  Discharge Medications  Full code  Discharge Instructions    Face-to-face encounter (required for Medicare/Medicaid patients)    Complete by:  As directed   I KONIDENA,SNEHALATHA certify that this patient is under my care and that I, or a nurse practitioner or physician's assistant working with me, had a face-to-face encounter that meets the physician face-to-face encounter requirements with this patient on 11/16/2014. The encounter with the patient was in whole, or in part for the following medical condition(s) which is the primary reason for home health care  1.deconditoning Generalized weakness  The encounter with the patient was in whole, or in part, for the following medical condition, which is the primary reason for home health care:  short term memory/ambulation issues  I certify that, based on my findings, the following services are medically necessary home health services:  Physical therapy  Reason for Medically Necessary Home Health Services:  Therapy- Home Adaptation to Facilitate Safety  My clinical findings support the need for the above services:  Unable to leave home safely without assistance and/or assistive device  Further, I certify that my clinical findings support that this  patient is homebound due to:  Unable to leave home safely without assistance     Home Health     Complete by:  As directed   To provide the following care/treatments:   PT Home Health Aide              Medication List    STOP taking these medications        ciprofloxacin 500 MG tablet  Commonly known as:  CIPRO     clopidogrel 75 MG tablet  Commonly known as:  PLAVIX     doxycycline 100 MG tablet  Commonly known as:  ADOXA     metoprolol succinate 50 MG 24 hr tablet  Commonly known as:  TOPROL-XL      TAKE these medications        atorvastatin 10 MG tablet  Commonly known as:  LIPITOR  Take 10 mg by mouth daily.     citalopram 20 MG tablet  Commonly known as:  CELEXA  Take 20 mg by mouth daily.     esomeprazole 20 MG capsule  Commonly known as:  NEXIUM  Take 20 mg by mouth daily at 12 noon.     feeding supplement (ENSURE ENLIVE) Liqd  Take 237 mLs by mouth 2 (two) times daily with a meal.     folic acid 1 MG tablet  Commonly known as:  FOLVITE  Take 1 mg by mouth daily.     folic acid 1 MG tablet  Commonly known as:  FOLVITE  Take 1 mg by mouth daily.     lisinopril 10 MG tablet  Commonly known as:  PRINIVIL,ZESTRIL  Take 10 mg by mouth daily.     loratadine 10 MG tablet  Commonly known as:  CLARITIN  Take 1 tablet (10 mg total) by mouth daily.     methotrexate 2.5 MG tablet  Commonly known as:  RHEUMATREX  - Take 12.5 mg by mouth once a week. Caution:Chemotherapy. Protect from light.  - Pt takes on Wednesday     rivaroxaban 20 MG Tabs tablet  Commonly known as:  XARELTO  Take 20 mg by mouth daily.     simvastatin 20 MG tablet  Commonly known as:  ZOCOR  Take 20 mg by mouth at bedtime.          Diet and Activity recommendation: See Discharge Instructions above   Consults obtained -cardio   Major procedures and Radiology Reports - PLEASE review detailed and final reports for all details, in brief -      No results found.  Micro Results    No results found for this or any previous visit (from the past 240  hour(s)).     Today   Subjective:   Kelly Wright today has no headache,no chest abdominal pain,no new weakness tingling or numbness, feels much better wants to go home toda  Objective:   Blood pressure 109/43, pulse 61, temperature 98.2 F (36.8 C), temperature source Oral, resp. rate 18, height 5\' 9"  (1.753 m), weight 58.287 kg (128 lb 8 oz), SpO2 98 %.  No intake or output data in the 24 hours ending 11/30/14 1644  Exam Awake Alert, Oriented x 3, No new F.N deficits, Normal affect [place].AT,PERRAL Supple Neck,No JVD, No cervical lymphadenopathy appriciated.  Symmetrical Chest wall movement, Good air movement bilaterally, CTAB RRR,No Gallops,Rubs or new Murmurs, No Parasternal Heave +ve B.Sounds, Abd Soft, Non tender, No organomegaly appriciated, No rebound -guarding or rigidity. No Cyanosis, Clubbing or edema,  No new Rash or bruise  Data Review   CBC w Diff:  Lab Results  Component Value Date   WBC 4.8 11/12/2014   WBC 4.0 04/03/2013   HGB 13.1 11/16/2014   HGB 12.4 04/03/2013   HCT 39.0 11/12/2014   HCT 36.8 04/03/2013   PLT 122* 11/12/2014   PLT 152 04/03/2013    CMP:  Lab Results  Component Value Date   NA 139 11/12/2014   NA 141 04/03/2013   K 3.4* 11/12/2014   K 3.8 04/03/2013   CL 107 11/12/2014   CL 108* 04/03/2013   CO2 26 11/12/2014   CO2 27 04/03/2013   BUN 18 11/12/2014   BUN 20* 04/03/2013   CREATININE 0.88 11/15/2014   CREATININE 0.97 04/03/2013   PROT 6.8 11/11/2014   PROT 6.3* 04/03/2013   ALBUMIN 4.0 11/11/2014   ALBUMIN 3.4 04/03/2013   BILITOT 1.3* 11/11/2014   ALKPHOS 47 11/11/2014   ALKPHOS 54 04/03/2013   AST 28 11/11/2014   AST 23 04/03/2013   ALT 16 11/11/2014   ALT 19 04/03/2013  .   Total Time in preparing paper work, data evaluation and todays exam - 35 minutes  KONIDENA,SNEHALATHA M.D on 5/6 /2016 at 4:44 PM

## 2015-08-10 ENCOUNTER — Encounter: Payer: Self-pay | Admitting: Emergency Medicine

## 2015-08-10 ENCOUNTER — Emergency Department: Payer: Medicare Other

## 2015-08-10 ENCOUNTER — Observation Stay
Admission: EM | Admit: 2015-08-10 | Discharge: 2015-08-12 | Disposition: A | Discharge status: Home / Self Care | Payer: Medicare Other | Attending: Internal Medicine | Admitting: Internal Medicine

## 2015-08-10 DIAGNOSIS — Z807 Family history of other malignant neoplasms of lymphoid, hematopoietic and related tissues: Secondary | ICD-10-CM | POA: Insufficient documentation

## 2015-08-10 DIAGNOSIS — Z823 Family history of stroke: Secondary | ICD-10-CM | POA: Insufficient documentation

## 2015-08-10 DIAGNOSIS — S42001A Fracture of unspecified part of right clavicle, initial encounter for closed fracture: Secondary | ICD-10-CM | POA: Diagnosis present

## 2015-08-10 DIAGNOSIS — Z86718 Personal history of other venous thrombosis and embolism: Secondary | ICD-10-CM | POA: Diagnosis not present

## 2015-08-10 DIAGNOSIS — Y939 Activity, unspecified: Secondary | ICD-10-CM | POA: Diagnosis not present

## 2015-08-10 DIAGNOSIS — N3090 Cystitis, unspecified without hematuria: Secondary | ICD-10-CM | POA: Insufficient documentation

## 2015-08-10 DIAGNOSIS — S42032A Displaced fracture of lateral end of left clavicle, initial encounter for closed fracture: Secondary | ICD-10-CM | POA: Insufficient documentation

## 2015-08-10 DIAGNOSIS — Z801 Family history of malignant neoplasm of trachea, bronchus and lung: Secondary | ICD-10-CM | POA: Insufficient documentation

## 2015-08-10 DIAGNOSIS — M25512 Pain in left shoulder: Secondary | ICD-10-CM | POA: Insufficient documentation

## 2015-08-10 DIAGNOSIS — S2232XA Fracture of one rib, left side, initial encounter for closed fracture: Secondary | ICD-10-CM | POA: Diagnosis not present

## 2015-08-10 DIAGNOSIS — I499 Cardiac arrhythmia, unspecified: Secondary | ICD-10-CM | POA: Diagnosis not present

## 2015-08-10 DIAGNOSIS — S2249XA Multiple fractures of ribs, unspecified side, initial encounter for closed fracture: Secondary | ICD-10-CM | POA: Diagnosis present

## 2015-08-10 DIAGNOSIS — M2578 Osteophyte, vertebrae: Secondary | ICD-10-CM | POA: Insufficient documentation

## 2015-08-10 DIAGNOSIS — J449 Chronic obstructive pulmonary disease, unspecified: Secondary | ICD-10-CM | POA: Diagnosis not present

## 2015-08-10 DIAGNOSIS — M542 Cervicalgia: Secondary | ICD-10-CM | POA: Insufficient documentation

## 2015-08-10 DIAGNOSIS — M1990 Unspecified osteoarthritis, unspecified site: Secondary | ICD-10-CM | POA: Diagnosis not present

## 2015-08-10 DIAGNOSIS — W19XXXA Unspecified fall, initial encounter: Secondary | ICD-10-CM | POA: Diagnosis not present

## 2015-08-10 DIAGNOSIS — E785 Hyperlipidemia, unspecified: Secondary | ICD-10-CM | POA: Insufficient documentation

## 2015-08-10 DIAGNOSIS — I739 Peripheral vascular disease, unspecified: Secondary | ICD-10-CM | POA: Diagnosis not present

## 2015-08-10 DIAGNOSIS — I4891 Unspecified atrial fibrillation: Secondary | ICD-10-CM | POA: Insufficient documentation

## 2015-08-10 DIAGNOSIS — F0390 Unspecified dementia without behavioral disturbance: Secondary | ICD-10-CM | POA: Diagnosis not present

## 2015-08-10 DIAGNOSIS — S2241XA Multiple fractures of ribs, right side, initial encounter for closed fracture: Secondary | ICD-10-CM

## 2015-08-10 DIAGNOSIS — Z79899 Other long term (current) drug therapy: Secondary | ICD-10-CM | POA: Insufficient documentation

## 2015-08-10 DIAGNOSIS — S2239XA Fracture of one rib, unspecified side, initial encounter for closed fracture: Secondary | ICD-10-CM | POA: Diagnosis present

## 2015-08-10 DIAGNOSIS — M8580 Other specified disorders of bone density and structure, unspecified site: Secondary | ICD-10-CM | POA: Insufficient documentation

## 2015-08-10 DIAGNOSIS — N390 Urinary tract infection, site not specified: Secondary | ICD-10-CM

## 2015-08-10 DIAGNOSIS — Z8673 Personal history of transient ischemic attack (TIA), and cerebral infarction without residual deficits: Secondary | ICD-10-CM | POA: Diagnosis not present

## 2015-08-10 LAB — CBC WITH DIFFERENTIAL/PLATELET
Basophils Absolute: 0 10*3/uL (ref 0–0.1)
Basophils Relative: 1 %
EOS ABS: 0 10*3/uL (ref 0–0.7)
EOS PCT: 1 %
HCT: 38.8 % (ref 35.0–47.0)
Hemoglobin: 12.8 g/dL (ref 12.0–16.0)
Lymphocytes Relative: 20 %
Lymphs Abs: 1.1 10*3/uL (ref 1.0–3.6)
MCH: 32.2 pg (ref 26.0–34.0)
MCHC: 33 g/dL (ref 32.0–36.0)
MCV: 97.7 fL (ref 80.0–100.0)
Monocytes Absolute: 0.4 10*3/uL (ref 0.2–0.9)
Monocytes Relative: 8 %
NEUTROS PCT: 70 %
Neutro Abs: 4 10*3/uL (ref 1.4–6.5)
PLATELETS: 140 10*3/uL — AB (ref 150–440)
RBC: 3.97 MIL/uL (ref 3.80–5.20)
RDW: 15.2 % — ABNORMAL HIGH (ref 11.5–14.5)
WBC: 5.6 10*3/uL (ref 3.6–11.0)

## 2015-08-10 LAB — URINALYSIS COMPLETE WITH MICROSCOPIC (ARMC ONLY)
BILIRUBIN URINE: NEGATIVE
Bacteria, UA: NONE SEEN
Glucose, UA: NEGATIVE mg/dL
Hgb urine dipstick: NEGATIVE
Nitrite: NEGATIVE
PH: 5 (ref 5.0–8.0)
PROTEIN: NEGATIVE mg/dL
Specific Gravity, Urine: 1.027 (ref 1.005–1.030)

## 2015-08-10 LAB — BASIC METABOLIC PANEL
Anion gap: 7 (ref 5–15)
BUN: 16 mg/dL (ref 6–20)
CALCIUM: 8.9 mg/dL (ref 8.9–10.3)
CO2: 29 mmol/L (ref 22–32)
CREATININE: 0.77 mg/dL (ref 0.44–1.00)
Chloride: 104 mmol/L (ref 101–111)
Glucose, Bld: 107 mg/dL — ABNORMAL HIGH (ref 65–99)
Potassium: 3.5 mmol/L (ref 3.5–5.1)
SODIUM: 140 mmol/L (ref 135–145)

## 2015-08-10 MED ORDER — FENTANYL CITRATE (PF) 100 MCG/2ML IJ SOLN
50.0000 ug | Freq: Once | INTRAMUSCULAR | Status: AC
  Administered 2015-08-10: 50 ug via INTRAVENOUS

## 2015-08-10 MED ORDER — FENTANYL CITRATE (PF) 100 MCG/2ML IJ SOLN
50.0000 ug | Freq: Once | INTRAMUSCULAR | Status: AC
  Administered 2015-08-10: 50 ug via INTRAVENOUS
  Filled 2015-08-10: qty 2

## 2015-08-10 MED ORDER — DEXTROSE 5 % IV SOLN
1.0000 g | Freq: Once | INTRAVENOUS | Status: AC
  Administered 2015-08-10: 1 g via INTRAVENOUS
  Filled 2015-08-10: qty 10

## 2015-08-10 NOTE — ED Notes (Signed)
Pt reports fall, hx dementia and cannot recall circumstances of fall.  Pt c/o pain to left shoulder and ribs.  Pt NAD upon assessment, respirations equal and unlabored, skin warm and dry.

## 2015-08-10 NOTE — ED Notes (Signed)
Yellow high risk fall braclet placed on patient's right wrist.

## 2015-08-10 NOTE — ED Notes (Signed)
Pt taken to CT and xray

## 2015-08-10 NOTE — ED Notes (Signed)
Pt returned from radiology.

## 2015-08-10 NOTE — ED Notes (Signed)
Patient reports fell (unknown reason)  Patient has history of dementia and unsure why she fell.  Patient complains of left shoulder and left rib pain.

## 2015-08-11 DIAGNOSIS — S2239XA Fracture of one rib, unspecified side, initial encounter for closed fracture: Secondary | ICD-10-CM | POA: Diagnosis present

## 2015-08-11 DIAGNOSIS — S2232XA Fracture of one rib, left side, initial encounter for closed fracture: Secondary | ICD-10-CM | POA: Diagnosis not present

## 2015-08-11 DIAGNOSIS — S2249XA Multiple fractures of ribs, unspecified side, initial encounter for closed fracture: Secondary | ICD-10-CM | POA: Diagnosis present

## 2015-08-11 LAB — TSH: TSH: 3.096 u[IU]/mL (ref 0.350–4.500)

## 2015-08-11 MED ORDER — HYDROCODONE-ACETAMINOPHEN 5-325 MG PO TABS
1.0000 | ORAL_TABLET | ORAL | Status: DC | PRN
  Administered 2015-08-11: 1 via ORAL
  Filled 2015-08-11 (×2): qty 1

## 2015-08-11 MED ORDER — DIPHENHYDRAMINE HCL 12.5 MG/5ML PO ELIX: 12.5000 mg | ORAL_SOLUTION | Freq: Four times a day (QID) | ORAL | Status: DC | PRN

## 2015-08-11 MED ORDER — SODIUM CHLORIDE 0.9% FLUSH: 9.0000 mL | INTRAVENOUS | Status: DC | PRN

## 2015-08-11 MED ORDER — FOLIC ACID 1 MG PO TABS
1.0000 mg | ORAL_TABLET | Freq: Every day | ORAL | Status: DC
  Administered 2015-08-11 – 2015-08-12 (×2): 1 mg via ORAL
  Filled 2015-08-11 (×2): qty 1

## 2015-08-11 MED ORDER — SODIUM CHLORIDE 0.9 % IV SOLN
INTRAVENOUS | Status: DC
  Administered 2015-08-11: 01:00:00 via INTRAVENOUS

## 2015-08-11 MED ORDER — IBUPROFEN 400 MG PO TABS
400.0000 mg | ORAL_TABLET | Freq: Three times a day (TID) | ORAL | Status: DC
  Administered 2015-08-11 – 2015-08-12 (×4): 400 mg via ORAL
  Filled 2015-08-11 (×4): qty 1

## 2015-08-11 MED ORDER — CEFTRIAXONE SODIUM 1 G IJ SOLR
1.0000 g | INTRAMUSCULAR | Status: DC
  Administered 2015-08-11: 1 g via INTRAVENOUS
  Filled 2015-08-11 (×2): qty 10

## 2015-08-11 MED ORDER — NALOXONE HCL 0.4 MG/ML IJ SOLN: 0.4000 mg | INTRAMUSCULAR | Status: DC | PRN

## 2015-08-11 MED ORDER — CITALOPRAM HYDROBROMIDE 20 MG PO TABS
20.0000 mg | ORAL_TABLET | Freq: Every day | ORAL | Status: DC
  Administered 2015-08-11 – 2015-08-12 (×2): 20 mg via ORAL
  Filled 2015-08-11 (×2): qty 1

## 2015-08-11 MED ORDER — DIPHENHYDRAMINE HCL 50 MG/ML IJ SOLN: 12.5000 mg | Freq: Four times a day (QID) | INTRAMUSCULAR | Status: DC | PRN

## 2015-08-11 MED ORDER — ATORVASTATIN CALCIUM 10 MG PO TABS
10.0000 mg | ORAL_TABLET | Freq: Every day | ORAL | Status: DC
  Administered 2015-08-11 – 2015-08-12 (×2): 10 mg via ORAL
  Filled 2015-08-11 (×2): qty 1

## 2015-08-11 MED ORDER — MORPHINE SULFATE 2 MG/ML IV SOLN
INTRAVENOUS | Status: DC
  Administered 2015-08-11: 02:00:00 via INTRAVENOUS
  Administered 2015-08-11: 1.5 mL via INTRAVENOUS
  Filled 2015-08-11: qty 25

## 2015-08-11 MED ORDER — DOCUSATE SODIUM 100 MG PO CAPS
100.0000 mg | ORAL_CAPSULE | Freq: Two times a day (BID) | ORAL | Status: DC
  Administered 2015-08-11 – 2015-08-12 (×3): 100 mg via ORAL
  Filled 2015-08-11 (×3): qty 1

## 2015-08-11 MED ORDER — DEXTROSE 5 % IV SOLN: 1.0000 g | INTRAVENOUS | Status: DC

## 2015-08-11 MED ORDER — HEPARIN SODIUM (PORCINE) 5000 UNIT/ML IJ SOLN
5000.0000 [IU] | Freq: Three times a day (TID) | INTRAMUSCULAR | Status: DC
  Administered 2015-08-11 – 2015-08-12 (×6): 5000 [IU] via SUBCUTANEOUS
  Filled 2015-08-11 (×6): qty 1

## 2015-08-11 MED ORDER — FENTANYL 40 MCG/ML IV SOLN: INTRAVENOUS | Status: DC

## 2015-08-11 MED ORDER — ONDANSETRON HCL 4 MG PO TABS: 4.0000 mg | ORAL_TABLET | Freq: Four times a day (QID) | ORAL | Status: DC | PRN

## 2015-08-11 MED ORDER — INFLUENZA VAC SPLIT QUAD 0.5 ML IM SUSY
0.5000 mL | PREFILLED_SYRINGE | INTRAMUSCULAR | Status: DC
  Filled 2015-08-11: qty 0.5

## 2015-08-11 MED ORDER — IBUPROFEN 400 MG PO TABS: 400.0000 mg | ORAL_TABLET | Freq: Four times a day (QID) | ORAL | Status: DC | PRN

## 2015-08-11 MED ORDER — ONDANSETRON HCL 4 MG/2ML IJ SOLN
4.0000 mg | Freq: Four times a day (QID) | INTRAMUSCULAR | Status: DC | PRN
Start: 2015-08-11 — End: 2015-08-12

## 2015-08-11 MED ORDER — SODIUM CHLORIDE 0.9% FLUSH
3.0000 mL | Freq: Two times a day (BID) | INTRAVENOUS | Status: DC
  Administered 2015-08-11 – 2015-08-12 (×3): 3 mL via INTRAVENOUS

## 2015-08-11 NOTE — Evaluation (Signed)
Physical Therapy Evaluation Patient Details Name: Kelly Wright MRN: [id number] DOB: 1932-09-17 Today's Date: 08/11/2015   History of Present Illness  Pt fell last week (?) on her carport steps and suffered posterior L rib fractures.   Clinical Impression  Pt is able to get to standing and walk >100 ft w/o too much issue.  She has no LOBs but is very slow and deliberate and generally lacks confidence with ambulation w/o walker (very cautious with single UE and no UE use.)  She does not appear to need rehab but will need more at home assist than she currently has with daughter-in-law checking in occasionally.  She is not sure about how well she will be able to care for herself at home.      Follow Up Recommendations Home health PT;Supervision - Intermittent    Equipment Recommendations   (pt reports she has a walker)    Recommendations for Other Services       Precautions / Restrictions Precautions Precautions: Fall Restrictions Weight Bearing Restrictions: No      Mobility  Bed Mobility               General bed mobility comments: Pt sitting up at EOB on arrival  Transfers Overall transfer level: Needs assistance Equipment used: Rolling walker (2 wheeled) Transfers: Sit to/from Stand Sit to Stand: Min assist         General transfer comment: Pt nearly able to rise w/o assist, but is hesitant secondary to pain.  Needs AD to maintain balance on standing.   Ambulation/Gait Ambulation/Gait assistance: Min guard Ambulation Distance (Feet): 125 Feet Assistive device: 4-wheeled walker;1 person hand held assist;None     Gait velocity interpretation: <1.8 ft/sec, indicative of risk for recurrent falls General Gait Details: Pt with slow, cautious ambulation but no LOBs.  She does a majority of walking with AD, but uses PTs hand/hallrail for ~20 ft and no UE use for ~10 ft.  Both of which were slower and more guarded.  Pt has brief moments of increased rib pain with the  effort.   Stairs            Wheelchair Mobility    Modified Rankin (Stroke Patients Only)       Balance                                             Pertinent Vitals/Pain Pain Assessment: 0-10 Pain Score: 5  Pain Location: pain increases with movement/deep breaths/etc    Home Living Family/patient expects to be discharged to:: Private residence Living Arrangements: Alone Available Help at Discharge:  (daughter-in-law staps by regularly)   Home Access: Stairs to enter   [employer] of Steps: 2          Prior Function Level of Independence: Independent         Comments: Pt was not out of the house much but was able to care for herself     Hand Dominance        Extremity/Trunk Assessment   Upper Extremity Assessment: Overall WFL for tasks assessed           Lower Extremity Assessment: Overall WFL for tasks assessed         Communication   Communication: No difficulties  Cognition Arousal/Alertness: Awake/alert Behavior During Therapy: WFL for tasks assessed/performed Overall Cognitive Status: Within Functional Limits  for tasks assessed                      General Comments      Exercises        Assessment/Plan    PT Assessment Patient needs continued PT services  PT Diagnosis Difficulty walking;Generalized weakness;Acute pain   PT Problem List Decreased strength;Decreased activity tolerance;Decreased range of motion;Decreased balance;Decreased mobility;Decreased knowledge of use of DME;Decreased safety awareness;Pain  PT Treatment Interventions DME instruction;Gait training;Stair training;Functional mobility training;Therapeutic exercise;Therapeutic activities;Balance training;Neuromuscular re-education   PT Goals (Current goals can be found in the Care Plan section) Acute Rehab PT Goals Patient Stated Goal: "I just want to make sure I'm safe if I go home" PT Goal Formulation: With patient Time  For Goal Achievement: 08/18/15 Potential to Achieve Goals: Fair    Frequency Min 2X/week   Barriers to discharge   Pt lives alone and may not have as much help as she needs to be safe.    Co-evaluation               End of Session Equipment Utilized During Treatment: Gait belt Activity Tolerance: Patient limited by pain;Patient limited by fatigue Patient left: with bed alarm set;with call bell/phone within reach      Functional Assessment Tool Used: clinical judgement Functional Limitation: Mobility: Walking and moving around Mobility: Walking and Moving Around Current Status [phone]): At least 20 percent but less than 40 percent impaired, limited or restricted Mobility: Walking and Moving Around Goal Status [phone]): At least 1 percent but less than 20 percent impaired, limited or restricted    Time: 5993-5701 PT Time Calculation (min) (ACUTE ONLY): 24 min   Charges:   PT Evaluation $PT Eval Low Complexity: 1 Procedure     PT G Codes:   PT G-Codes **NOT FOR INPATIENT CLASS** Functional Assessment Tool Used: clinical judgement Functional Limitation: Mobility: Walking and moving around Mobility: Walking and Moving Around Current Status ([id number]): At least 20 percent but less than 40 percent impaired, limited or restricted Mobility: Walking and Moving Around Goal Status [phone]): At least 1 percent but less than 20 percent impaired, limited or restricted   Loran Senters, PT, DPT [id number]  Malachi Pro 08/11/2015, 3:29 PM

## 2015-08-11 NOTE — H&P (Signed)
Kelly Wright is an 82 y.o. female.   Chief Complaint: Broken ribs HPI: The patient presents emergency department complaining of pain in her chest over the area of her broken ribs. She fell yesterday by missing a step on her carport, breaking her second through sixth posterior left ribs. She has been managing her pain with Tylenol but did not have adequate relief. She denies shortness of breath, pain with inspiration, anterior chest pain, nausea, vomiting or diaphoresis. Past medical history is significant for dementia as well as atrial fibrillation, DVT and CVA. Though she is remarkably clear and relatively high functioning, the patient lives alone and has been less mobile since her injury which concerned the emergency department staff for her prognosis and safety. Laboratory evaluation in the emergency department also revealed urinary tract infection. The emergency physician called the hospitalist service for pain management and antibiotic therapy for UTI.  Past Medical History  Diagnosis Date  . Cerebral vascular accident ([place])   . Atrial fibrillation ([place])   . Arthritis   . DVT (deep venous thrombosis) ([place])   . Dysrhythmia     Past Surgical History  Procedure Laterality Date  . Abdominal hysterectomy      Family History  Problem Relation Age of Onset  . Hodgkin's lymphoma Mother   . Cancer - Lung Father   . CVA Other    Social History:  reports that she has never smoked. She does not have any smokeless tobacco history on file. She reports that she does not drink alcohol or use illicit drugs.  Allergies: No Known Allergies  Prior to Admission medications   Medication Sig Start Date End Date Taking? Authorizing Provider  atorvastatin (LIPITOR) 10 MG tablet Take 10 mg by mouth daily.   Yes Historical Provider, MD  citalopram (CELEXA) 20 MG tablet Take 20 mg by mouth daily.   Yes Historical Provider, MD  folic acid (FOLVITE) 1 MG tablet Take 1 mg by mouth daily.   Yes Historical  Provider, MD  methotrexate (RHEUMATREX) 2.5 MG tablet Take 12.5 mg by mouth once a week. Caution:Chemotherapy. Protect from light. Pt takes on Wednesday   Yes Historical Provider, MD  esomeprazole (NEXIUM) 20 MG capsule Take 20 mg by mouth daily at 12 noon.    Historical Provider, MD  feeding supplement, ENSURE ENLIVE, (ENSURE ENLIVE) LIQD Take 237 mLs by mouth 2 (two) times daily with a meal. 11/16/14   Epifanio Lesches, MD  folic acid (FOLVITE) 1 MG tablet Take 1 mg by mouth daily.    Historical Provider, MD  lisinopril (PRINIVIL,ZESTRIL) 10 MG tablet Take 10 mg by mouth daily.    Historical Provider, MD  loratadine (CLARITIN) 10 MG tablet Take 1 tablet (10 mg total) by mouth daily. 11/16/14   Epifanio Lesches, MD  rivaroxaban (XARELTO) 20 MG TABS tablet Take 20 mg by mouth daily.    Historical Provider, MD  simvastatin (ZOCOR) 20 MG tablet Take 20 mg by mouth at bedtime.    Historical Provider, MD     Results for orders placed or performed during the hospital encounter of 08/10/15 (from the past 48 hour(s))  Basic metabolic panel     Status: Abnormal   Collection Time: 08/10/15 10:24 PM  Result Value Ref Range   Sodium 140 135 - 145 mmol/L   Potassium 3.5 3.5 - 5.1 mmol/L   Chloride 104 101 - 111 mmol/L   CO2 29 22 - 32 mmol/L   Glucose, Bld 107 (H) 65 - 99 mg/dL  BUN 16 6 - 20 mg/dL   Creatinine, Ser 0.77 0.44 - 1.00 mg/dL   Calcium 8.9 8.9 - 10.3 mg/dL   GFR calc non Af Amer >60 >60 mL/min   GFR calc Af Amer >60 >60 mL/min    Comment: (NOTE) The eGFR has been calculated using the CKD EPI equation. This calculation has not been validated in all clinical situations. eGFR's persistently <60 mL/min signify possible Chronic Kidney Disease.    Anion gap 7 5 - 15  CBC with Differential     Status: Abnormal   Collection Time: 08/10/15 10:24 PM  Result Value Ref Range   WBC 5.6 3.6 - 11.0 K/uL   RBC 3.97 3.80 - 5.20 MIL/uL   Hemoglobin 12.8 12.0 - 16.0 g/dL   HCT 38.8 35.0 -  47.0 %   MCV 97.7 80.0 - 100.0 fL   MCH 32.2 26.0 - 34.0 pg   MCHC 33.0 32.0 - 36.0 g/dL   RDW 15.2 (H) 11.5 - 14.5 %   Platelets 140 (L) 150 - 440 K/uL   Neutrophils Relative % 70 %   Neutro Abs 4.0 1.4 - 6.5 K/uL   Lymphocytes Relative 20 %   Lymphs Abs 1.1 1.0 - 3.6 K/uL   Monocytes Relative 8 %   Monocytes Absolute 0.4 0.2 - 0.9 K/uL   Eosinophils Relative 1 %   Eosinophils Absolute 0.0 0 - 0.7 K/uL   Basophils Relative 1 %   Basophils Absolute 0.0 0 - 0.1 K/uL  Urinalysis complete, with microscopic     Status: Abnormal   Collection Time: 08/10/15 10:34 PM  Result Value Ref Range   Color, Urine YELLOW (A) YELLOW   APPearance HAZY (A) CLEAR   Glucose, UA NEGATIVE NEGATIVE mg/dL   Bilirubin Urine NEGATIVE NEGATIVE   Ketones, ur TRACE (A) NEGATIVE mg/dL   Specific Gravity, Urine 1.027 1.005 - 1.030   Hgb urine dipstick NEGATIVE NEGATIVE   pH 5.0 5.0 - 8.0   Protein, ur NEGATIVE NEGATIVE mg/dL   Nitrite NEGATIVE NEGATIVE   Leukocytes, UA 3+ (A) NEGATIVE   RBC / HPF 0-5 0 - 5 RBC/hpf   WBC, UA 6-30 0 - 5 WBC/hpf   Bacteria, UA NONE SEEN NONE SEEN   Squamous Epithelial / LPF 0-5 (A) NONE SEEN   Mucous PRESENT    Dg Chest 2 View  08/10/2015  CLINICAL DATA:  Acute onset of left shoulder pain after fall. Initial encounter. EXAM: CHEST  2 VIEW COMPARISON:  None. FINDINGS: There is a mildly displaced mildly comminuted fracture involving the distal left clavicle, with mild inferior displacement of the distal clavicular fragment. The left acromioclavicular joint is unremarkable in appearance. Displaced fractures of the left second through sixth posterior ribs are again noted. The lungs are hyperexpanded, with flattening of the hemidiaphragms, compatible with COPD. There is no evidence of focal opacification, pleural effusion or pneumothorax. Cardiomediastinal silhouette is within normal limits. No acute osseous abnormalities are seen. IMPRESSION: 1. Mildly displaced mildly comminuted  fracture involving the distal left clavicle, with mild inferior displacement of the distal clavicular fragment. 2. Displaced fractures of the left second through sixth posterior ribs again seen. 3. Findings of COPD.  Lungs remain grossly clear. Electronically Signed   By: Garald Balding M.D.   On: 08/10/2015 23:01   Ct Head Wo Contrast  08/10/2015  CLINICAL DATA:  Status post fall. Concern for head injury. Neck pain. Initial encounter. EXAM: CT HEAD WITHOUT CONTRAST CT CERVICAL SPINE WITHOUT CONTRAST TECHNIQUE:  Multidetector CT imaging of the head and cervical spine was performed following the standard protocol without intravenous contrast. Multiplanar CT image reconstructions of the cervical spine were also generated. COMPARISON:  CT of the head performed 11/19/2011, and MRI of the brain performed 11/20/2011 FINDINGS: CT HEAD FINDINGS There is no evidence of acute infarction, mass lesion, or intra- or extra-axial hemorrhage on CT. Prominence of the ventricles and sulci reflects mild cortical volume loss. Mild cerebellar atrophy is noted. Scattered periventricular white matter change likely reflects small vessel ischemic microangiopathy. The brainstem and fourth ventricle are within normal limits. The basal ganglia are unremarkable in appearance. The cerebral hemispheres demonstrate grossly normal gray-white differentiation. No mass effect or midline shift is seen. There is no evidence of fracture; visualized osseous structures are unremarkable in appearance. The visualized portions of the orbits are within normal limits. The paranasal sinuses and mastoid air cells are well-aerated. No significant soft tissue abnormalities are seen. CT CERVICAL SPINE FINDINGS There is no evidence of acute fracture or subluxation. Vertebral bodies demonstrate normal height. Mild multilevel disc space narrowing is noted along the lower cervical spine, with small anterior and posterior disc osteophyte complexes. There is mild grade  1 anterolisthesis of C7 on T1, with underlying facet disease. Prevertebral soft tissues are within normal limits. The thyroid gland is unremarkable in appearance. The visualized lung apices are clear. No significant soft tissue abnormalities are seen. IMPRESSION: 1. No evidence of traumatic intracranial injury or fracture. 2. No evidence of fracture or subluxation along the cervical spine. 3. Mild cortical volume loss and scattered small vessel ischemic microangiopathy. 4. Mild degenerative change along the lower cervical spine. Electronically Signed   By: Garald Balding M.D.   On: 08/10/2015 22:59   Ct Cervical Spine Wo Contrast  08/10/2015  CLINICAL DATA:  Status post fall. Concern for head injury. Neck pain. Initial encounter. EXAM: CT HEAD WITHOUT CONTRAST CT CERVICAL SPINE WITHOUT CONTRAST TECHNIQUE: Multidetector CT imaging of the head and cervical spine was performed following the standard protocol without intravenous contrast. Multiplanar CT image reconstructions of the cervical spine were also generated. COMPARISON:  CT of the head performed 11/19/2011, and MRI of the brain performed 11/20/2011 FINDINGS: CT HEAD FINDINGS There is no evidence of acute infarction, mass lesion, or intra- or extra-axial hemorrhage on CT. Prominence of the ventricles and sulci reflects mild cortical volume loss. Mild cerebellar atrophy is noted. Scattered periventricular white matter change likely reflects small vessel ischemic microangiopathy. The brainstem and fourth ventricle are within normal limits. The basal ganglia are unremarkable in appearance. The cerebral hemispheres demonstrate grossly normal gray-white differentiation. No mass effect or midline shift is seen. There is no evidence of fracture; visualized osseous structures are unremarkable in appearance. The visualized portions of the orbits are within normal limits. The paranasal sinuses and mastoid air cells are well-aerated. No significant soft tissue  abnormalities are seen. CT CERVICAL SPINE FINDINGS There is no evidence of acute fracture or subluxation. Vertebral bodies demonstrate normal height. Mild multilevel disc space narrowing is noted along the lower cervical spine, with small anterior and posterior disc osteophyte complexes. There is mild grade 1 anterolisthesis of C7 on T1, with underlying facet disease. Prevertebral soft tissues are within normal limits. The thyroid gland is unremarkable in appearance. The visualized lung apices are clear. No significant soft tissue abnormalities are seen. IMPRESSION: 1. No evidence of traumatic intracranial injury or fracture. 2. No evidence of fracture or subluxation along the cervical spine. 3. Mild cortical volume loss  and scattered small vessel ischemic microangiopathy. 4. Mild degenerative change along the lower cervical spine. Electronically Signed   By: Garald Balding M.D.   On: 08/10/2015 22:59   Dg Shoulder Left  08/10/2015  CLINICAL DATA:  [place] 2 days ago. Left shoulder pain. Initial encounter. EXAM: LEFT SHOULDER - 2+ VIEW COMPARISON:  None. FINDINGS: Mildly displaced fracture of the distal left clavicle noted. No other fractures identified. No evidence of shoulder dislocation. Generalized osteopenia noted. In addition, minimally displaced fractures are seen involving the root left posterior second, third, fourth, fifth, and sixth ribs. No pneumothorax visualized on this exam. IMPRESSION: Distal left clavicle fracture. Minimally displaced fractures involving the left posterior second through sixth ribs. Osteopenia. Electronically Signed   By: Earle Gell M.D.   On: 08/10/2015 20:49    Review of Systems  Constitutional: Negative for fever and chills.  HENT: Negative for sore throat and tinnitus.   Eyes: Negative for blurred vision and redness.  Respiratory: Negative for cough and shortness of breath.   Cardiovascular: Negative for chest pain, palpitations, orthopnea and PND.  Gastrointestinal:  Negative for nausea, vomiting, abdominal pain and diarrhea.  Genitourinary: Negative for dysuria, urgency and frequency.  Musculoskeletal: Negative for myalgias and joint pain.       Rib pain  Skin: Negative for rash.       No lesions  Neurological: Negative for speech change, focal weakness and weakness.  Endo/Heme/Allergies: Does not bruise/bleed easily.       No temperature intolerance  Psychiatric/Behavioral: Negative for depression and suicidal ideas.    Blood pressure 137/61, pulse 60, temperature 99.3 F (37.4 C), temperature source Oral, resp. rate 16, height 5' 9"  (1.753 m), weight 58.968 kg (130 lb), SpO2 100 %. Physical Exam  Nursing note and vitals reviewed. Constitutional: She is oriented to person, place, and time. She appears well-developed and well-nourished. No distress.  HENT:  Head: Normocephalic and atraumatic.  Mouth/Throat: Oropharynx is clear and moist.  Eyes: Conjunctivae and EOM are normal. Pupils are equal, round, and reactive to light. No scleral icterus.  Neck: Normal range of motion. Neck supple. No JVD present. No tracheal deviation present. No thyromegaly present.  Cardiovascular: Normal rate, regular rhythm and normal heart sounds.  Exam reveals no gallop and no friction rub.   No murmur heard. Respiratory: Effort normal and breath sounds normal.  GI: Soft. Bowel sounds are normal. She exhibits no distension. There is no tenderness.  Genitourinary:  Deferred  Musculoskeletal: Normal range of motion. She exhibits no edema.  Lymphadenopathy:    She has no cervical adenopathy.  Neurological: She is alert and oriented to person, place, and time. No cranial nerve deficit. She exhibits normal muscle tone.  Skin: Skin is warm and dry. No rash noted. No erythema.  Psychiatric: She has a normal mood and affect. Her behavior is normal. Judgment and thought content normal.     Assessment/Plan This is an 82 year old female admitted for pain management and  urinary tract infection. 1. Fractured RIBS: Multiple broken ribs on the left. We will limit Tylenol intake as the patient has been managing pain with acetaminophen at home since yesterday. No signs or symptoms of toxicity at this time. I place the patient on a fentanyl PCA which will hopefully manage her pain while keeping her alert. Will obtain PT/OT evaluation for mobility and safety at home. Encourage mobility to decrease risk of pneumonia or recurrent UTI. 2. UTI: The patient is received a dose of ceftriaxone in the emergency  department. We will continue antibiotics to complete treatment. No signs or symptoms of sepsis. 3. Atrial fibrillation: The patient is not on anticoagulation presumably due to her falls risk. (She had been on Xarelto). She is rate controlled. 4. Dementia: Appears to be more short-term memory loss and more accurately mild cognitive impairment. Continue Celexa 5. Hyperlipidemia: Continue statin therapy 6. DVT prophylaxis: Heparin 7. GI prophylaxis: PPI per home regimen The patient is a full code. Time spent on admission orders and patient care approximately 45 minutes  Harrie Foreman 08/11/2015, 12:36 AM

## 2015-08-11 NOTE — Progress Notes (Signed)
[facility] - [place] at [facility]                                                                                                                                                                                            Patient Demographics   Kelly Wright, is a 82 y.o. female, DOB - 10-03-1932, JXB:[id number]  Admit date - 08/10/2015   Admitting Physician Arnaldo Natal, MD  Outpatient Primary MD for the patient is Leanna Sato, MD   LOS -   Subjective: Patient admitted with the rib pain, her pain is now improved. Denies any chest pain     Review of Systems:   CONSTITUTIONAL: No documented fever. No fatigue, weakness. No weight gain, no weight loss.  EYES: No blurry or double vision.  ENT: No tinnitus. No postnasal drip. No redness of the oropharynx.  RESPIRATORY: No cough, no wheeze, no hemoptysis. No dyspnea.  CARDIOVASCULAR:  chest pain related to rib pain. No orthopnea. No palpitations. No syncope.  GASTROINTESTINAL: No nausea, no vomiting or diarrhea. No abdominal pain. No melena or hematochezia.  GENITOURINARY: No dysuria or hematuria.  ENDOCRINE: No polyuria or nocturia. No heat or cold intolerance.  HEMATOLOGY: No anemia. No bruising. No bleeding.  INTEGUMENTARY: No rashes. No lesions.  MUSCULOSKELETAL: No arthritis. No swelling. No gout.  NEUROLOGIC: No numbness, tingling, or ataxia. No seizure-type activity.  PSYCHIATRIC: No anxiety. No insomnia. No ADD.    Vitals:   Filed Vitals:   08/11/15 0623 08/11/15 0757 08/11/15 0930 08/11/15 1121  BP:  138/53  136/63  Pulse:  58  61  Temp:  97.8 F (36.6 C)  97.7 F (36.5 C)  TempSrc:  Oral  Oral  Resp: 14 18 14 17   Height:      Weight:      SpO2: 100% 99% 99% 98%    Wt Readings from Last 3 Encounters:  08/11/15 55.293 kg (121 lb 14.4 oz)  11/16/14 58.287 kg (128 lb 8 oz)     Intake/Output Summary (Last 24 hours) at 08/11/15 1224 Last data filed at 08/11/15 1119  Gross per 24 hour   Intake    240 ml  Output    400 ml  Net   -160 ml    Physical Exam:   GENERAL: Pleasant-appearing in no apparent distress.  HEAD, EYES, EARS, NOSE AND THROAT: Atraumatic, normocephalic. Extraocular muscles are intact. Pupils equal and reactive to light. Sclerae anicteric. No conjunctival injection. No oro-pharyngeal erythema.  NECK: Supple. There is no jugular venous distention. No bruits, no lymphadenopathy, no thyromegaly.  HEART: Regular rate and rhythm,. No murmurs, no  rubs, no clicks.  LUNGS: Clear to auscultation bilaterally. No rales or rhonchi. No wheezes.  ABDOMEN: Soft, flat, nontender, nondistended. Has good bowel sounds. No hepatosplenomegaly appreciated.  EXTREMITIES: No evidence of any cyanosis, clubbing, or peripheral edema.  +2 pedal and radial pulses bilaterally.  NEUROLOGIC: The patient is alert, awake, and oriented x3 with no focal motor or sensory deficits appreciated bilaterally.  SKIN: Moist and warm with no rashes appreciated.  Psych: Not anxious, depressed LN: No inguinal LN enlargement    Antibiotics   Anti-infectives    Start     Dose/Rate Route Frequency Ordered Stop   08/12/15 0000  cefTRIAXone (ROCEPHIN) 1 g in dextrose 5 % 50 mL IVPB  Status:  Discontinued     1 g 100 mL/hr over 30 Minutes Intravenous Every 24 hours 08/11/15 0114 08/11/15 0139   08/11/15 1800  cefTRIAXone (ROCEPHIN) 1 g in dextrose 5 % 50 mL IVPB     1 g 100 mL/hr over 30 Minutes Intravenous Every 24 hours 08/11/15 0139     08/10/15 2315  cefTRIAXone (ROCEPHIN) 1 g in dextrose 5 % 50 mL IVPB     1 g 100 mL/hr over 30 Minutes Intravenous  Once 08/10/15 2309 08/10/15 2344      Medications   Scheduled Meds: . atorvastatin  10 mg Oral Daily  . cefTRIAXone (ROCEPHIN)  IV  1 g Intravenous Q24H  . citalopram  20 mg Oral Daily  . docusate sodium  100 mg Oral BID  . folic acid  1 mg Oral Daily  . heparin  5,000 Units Subcutaneous 3 times per day  . [START ON 08/12/2015] Influenza vac  split quadrivalent PF  0.5 mL Intramuscular Tomorrow-1000  . sodium chloride flush  3 mL Intravenous Q12H   Continuous Infusions: . sodium chloride 100 mL/hr at 08/11/15 0124   PRN Meds:.HYDROcodone-acetaminophen, ibuprofen, ondansetron **OR** ondansetron (ZOFRAN) IV   Data Review:   Micro Results Recent Results (from the past 240 hour(s))  Urine culture     Status: None (Preliminary result)   Collection Time: 08/10/15 10:34 PM  Result Value Ref Range Status   Specimen Description URINE, RANDOM  Final   Special Requests NONE  Final   Culture NO GROWTH < 12 HOURS  Final   Report Status PENDING  Incomplete    Radiology Reports Dg Chest 2 View  08/10/2015  CLINICAL DATA:  Acute onset of left shoulder pain after fall. Initial encounter. EXAM: CHEST  2 VIEW COMPARISON:  None. FINDINGS: There is a mildly displaced mildly comminuted fracture involving the distal left clavicle, with mild inferior displacement of the distal clavicular fragment. The left acromioclavicular joint is unremarkable in appearance. Displaced fractures of the left second through sixth posterior ribs are again noted. The lungs are hyperexpanded, with flattening of the hemidiaphragms, compatible with COPD. There is no evidence of focal opacification, pleural effusion or pneumothorax. Cardiomediastinal silhouette is within normal limits. No acute osseous abnormalities are seen. IMPRESSION: 1. Mildly displaced mildly comminuted fracture involving the distal left clavicle, with mild inferior displacement of the distal clavicular fragment. 2. Displaced fractures of the left second through sixth posterior ribs again seen. 3. Findings of COPD.  Lungs remain grossly clear. Electronically Signed   By: Roanna Raider M.D.   On: 08/10/2015 23:01   Ct Head Wo Contrast  08/10/2015  CLINICAL DATA:  Status post fall. Concern for head injury. Neck pain. Initial encounter. EXAM: CT HEAD WITHOUT CONTRAST CT CERVICAL SPINE WITHOUT CONTRAST  TECHNIQUE:  Multidetector CT imaging of the head and cervical spine was performed following the standard protocol without intravenous contrast. Multiplanar CT image reconstructions of the cervical spine were also generated. COMPARISON:  CT of the head performed 11/19/2011, and MRI of the brain performed 11/20/2011 FINDINGS: CT HEAD FINDINGS There is no evidence of acute infarction, mass lesion, or intra- or extra-axial hemorrhage on CT. Prominence of the ventricles and sulci reflects mild cortical volume loss. Mild cerebellar atrophy is noted. Scattered periventricular white matter change likely reflects small vessel ischemic microangiopathy. The brainstem and fourth ventricle are within normal limits. The basal ganglia are unremarkable in appearance. The cerebral hemispheres demonstrate grossly normal gray-white differentiation. No mass effect or midline shift is seen. There is no evidence of fracture; visualized osseous structures are unremarkable in appearance. The visualized portions of the orbits are within normal limits. The paranasal sinuses and mastoid air cells are well-aerated. No significant soft tissue abnormalities are seen. CT CERVICAL SPINE FINDINGS There is no evidence of acute fracture or subluxation. Vertebral bodies demonstrate normal height. Mild multilevel disc space narrowing is noted along the lower cervical spine, with small anterior and posterior disc osteophyte complexes. There is mild grade 1 anterolisthesis of C7 on T1, with underlying facet disease. Prevertebral soft tissues are within normal limits. The thyroid gland is unremarkable in appearance. The visualized lung apices are clear. No significant soft tissue abnormalities are seen. IMPRESSION: 1. No evidence of traumatic intracranial injury or fracture. 2. No evidence of fracture or subluxation along the cervical spine. 3. Mild cortical volume loss and scattered small vessel ischemic microangiopathy. 4. Mild degenerative change along  the lower cervical spine. Electronically Signed   By: Roanna Raider M.D.   On: 08/10/2015 22:59   Ct Cervical Spine Wo Contrast  08/10/2015  CLINICAL DATA:  Status post fall. Concern for head injury. Neck pain. Initial encounter. EXAM: CT HEAD WITHOUT CONTRAST CT CERVICAL SPINE WITHOUT CONTRAST TECHNIQUE: Multidetector CT imaging of the head and cervical spine was performed following the standard protocol without intravenous contrast. Multiplanar CT image reconstructions of the cervical spine were also generated. COMPARISON:  CT of the head performed 11/19/2011, and MRI of the brain performed 11/20/2011 FINDINGS: CT HEAD FINDINGS There is no evidence of acute infarction, mass lesion, or intra- or extra-axial hemorrhage on CT. Prominence of the ventricles and sulci reflects mild cortical volume loss. Mild cerebellar atrophy is noted. Scattered periventricular white matter change likely reflects small vessel ischemic microangiopathy. The brainstem and fourth ventricle are within normal limits. The basal ganglia are unremarkable in appearance. The cerebral hemispheres demonstrate grossly normal gray-white differentiation. No mass effect or midline shift is seen. There is no evidence of fracture; visualized osseous structures are unremarkable in appearance. The visualized portions of the orbits are within normal limits. The paranasal sinuses and mastoid air cells are well-aerated. No significant soft tissue abnormalities are seen. CT CERVICAL SPINE FINDINGS There is no evidence of acute fracture or subluxation. Vertebral bodies demonstrate normal height. Mild multilevel disc space narrowing is noted along the lower cervical spine, with small anterior and posterior disc osteophyte complexes. There is mild grade 1 anterolisthesis of C7 on T1, with underlying facet disease. Prevertebral soft tissues are within normal limits. The thyroid gland is unremarkable in appearance. The visualized lung apices are clear. No  significant soft tissue abnormalities are seen. IMPRESSION: 1. No evidence of traumatic intracranial injury or fracture. 2. No evidence of fracture or subluxation along the cervical spine. 3. Mild cortical volume loss  and scattered small vessel ischemic microangiopathy. 4. Mild degenerative change along the lower cervical spine. Electronically Signed   By: Roanna Raider M.D.   On: 08/10/2015 22:59   Dg Shoulder Left  08/10/2015  CLINICAL DATA:  Larey Seat 2 days ago. Left shoulder pain. Initial encounter. EXAM: LEFT SHOULDER - 2+ VIEW COMPARISON:  None. FINDINGS: Mildly displaced fracture of the distal left clavicle noted. No other fractures identified. No evidence of shoulder dislocation. Generalized osteopenia noted. In addition, minimally displaced fractures are seen involving the root left posterior second, third, fourth, fifth, and sixth ribs. No pneumothorax visualized on this exam. IMPRESSION: Distal left clavicle fracture. Minimally displaced fractures involving the left posterior second through sixth ribs. Osteopenia. Electronically Signed   By: Myles Rosenthal M.D.   On: 08/10/2015 20:49     CBC  Recent Labs Lab 08/10/15 2224  WBC 5.6  HGB 12.8  HCT 38.8  PLT 140*  MCV 97.7  MCH 32.2  MCHC 33.0  RDW 15.2*  LYMPHSABS 1.1  MONOABS 0.4  EOSABS 0.0  BASOSABS 0.0    Chemistries   Recent Labs Lab 08/10/15 2224  NA 140  K 3.5  CL 104  CO2 29  GLUCOSE 107*  BUN 16  CREATININE 0.77  CALCIUM 8.9   ------------------------------------------------------------------------------------------------------------------ estimated creatinine clearance is 49 mL/min (by C-G formula based on Cr of 0.77). ------------------------------------------------------------------------------------------------------------------ No results for input(s): HGBA1C in the last 72 hours. ------------------------------------------------------------------------------------------------------------------ No results  for input(s): CHOL, HDL, LDLCALC, TRIG, CHOLHDL, LDLDIRECT in the last 72 hours. ------------------------------------------------------------------------------------------------------------------  Recent Labs  08/10/15 2224  TSH 3.096   ------------------------------------------------------------------------------------------------------------------ No results for input(s): VITAMINB12, FOLATE, FERRITIN, TIBC, IRON, RETICCTPCT in the last 72 hours.  Coagulation profile No results for input(s): INR, PROTIME in the last 168 hours.  No results for input(s): DDIMER in the last 72 hours.  Cardiac Enzymes No results for input(s): CKMB, TROPONINI, MYOGLOBIN in the last 168 hours.  Invalid input(s): CK ------------------------------------------------------------------------------------------------------------------ Invalid input(s): POCBNP    Assessment & Plan   This is an 82 year old female admitted for pain management and urinary tract infection. 1. Fractured RIBS: Multiple broken ribs on the left. Pain improved we'll stop the morphine drip use Percocet as needed 2. UTI: Await urine cultures continue ceftriaxone 3. Atrial fibrillation: The patient is not on anticoagulation presumably due to her falls risk. (She had been on Xarelto). She is rate controlled. 4. Dementia: Appears to be more short-term memory loss and more accurately mild cognitive impairment. Continue Celexa 5. Hyperlipidemia: Continue statin therapy 6. DVT prophylaxis: Heparin 7. GI prophylaxis: PPI per home regimen The patient is a full code. Time spent on admission orders and patient care approximately 45 minutes       Code Status Orders        Start     Ordered   08/11/15 0115  Full code   Continuous     08/11/15 0114    Code Status History    Date Active Date Inactive Code Status Order ID Comments User Context   11/12/2014  2:35 AM 11/16/2014  9:54 PM Full Code [id number]  Vernell Barrier, MD Inpatient            Consults none DVT Prophylaxis heparin  Lab Results  Component Value Date   PLT 140* 08/10/2015     Time Spent in minutes   Auburn Bilberry M.D on 08/11/2015 at 12:24 PM  Between 7am to 6pm - Pager - [phone]  After 6pm go to [email or web address] -  password [email or web address] [facility]  [facility]   Office  [phone]

## 2015-08-11 NOTE — Care Management Obs Status (Signed)
MEDICARE OBSERVATION STATUS NOTIFICATION   Patient Details  Name: Kelly Wright MRN: [id number] Date of Birth: 1933-06-27   Medicare Observation Status Notification Given:  Yes (Patient oriented to self only. No family in room. letter left at bedside.)    Caren Macadam, RN 08/11/2015, 4:56 PM

## 2015-08-11 NOTE — Consult Note (Signed)
ORTHOPAEDIC CONSULTATION  REQUESTING PHYSICIAN: Auburn Bilberry, MD  Chief Complaint: Left shoulder pain status post fall  HPI: Kelly Wright is a 82 y.o. female who complains of  left shoulder rib pain status post fall onto her left side in her carport yesterday at home. Patient came to the emergency room secondary to pain. Patient is pleasant and cooperative but appears distracted during my visit due to visitors in her room.  Past Medical History  Diagnosis Date  . Cerebral vascular accident (HCC)   . Atrial fibrillation (HCC)   . Arthritis   . DVT (deep venous thrombosis) (HCC)   . Dysrhythmia    Past Surgical History  Procedure Laterality Date  . Abdominal hysterectomy     Social History   Social History  . Marital Status: Widowed    Spouse Name: N/A  . Number of Children: N/A  . Years of Education: N/A   Social History Main Topics  . Smoking status: Never Smoker   . Smokeless tobacco: None  . Alcohol Use: No  . Drug Use: No  . Sexual Activity: Not Asked   Other Topics Concern  . None   Social History Narrative   Family History  Problem Relation Age of Onset  . Hodgkin's lymphoma Mother   . Cancer - Lung Father   . CVA Other    No Known Allergies Prior to Admission medications   Medication Sig Start Date End Date Taking? Authorizing Provider  atorvastatin (LIPITOR) 10 MG tablet Take 10 mg by mouth daily.   Yes Historical Provider, MD  citalopram (CELEXA) 20 MG tablet Take 20 mg by mouth daily.   Yes Historical Provider, MD  folic acid (FOLVITE) 1 MG tablet Take 1 mg by mouth daily.   Yes Historical Provider, MD  methotrexate (RHEUMATREX) 2.5 MG tablet Take 12.5 mg by mouth once a week. Caution:Chemotherapy. Protect from light. Pt takes on Wednesday   Yes Historical Provider, MD  esomeprazole (NEXIUM) 20 MG capsule Take 20 mg by mouth daily at 12 noon.    Historical Provider, MD  feeding supplement, ENSURE ENLIVE, (ENSURE ENLIVE) LIQD Take 237 mLs by mouth 2  (two) times daily with a meal. 11/16/14   Katha Hamming, MD  folic acid (FOLVITE) 1 MG tablet Take 1 mg by mouth daily.    Historical Provider, MD  lisinopril (PRINIVIL,ZESTRIL) 10 MG tablet Take 10 mg by mouth daily.    Historical Provider, MD  loratadine (CLARITIN) 10 MG tablet Take 1 tablet (10 mg total) by mouth daily. 11/16/14   Katha Hamming, MD  rivaroxaban (XARELTO) 20 MG TABS tablet Take 20 mg by mouth daily.    Historical Provider, MD  simvastatin (ZOCOR) 20 MG tablet Take 20 mg by mouth at bedtime.    Historical Provider, MD   Dg Chest 2 View  08/10/2015  CLINICAL DATA:  Acute onset of left shoulder pain after fall. Initial encounter. EXAM: CHEST  2 VIEW COMPARISON:  None. FINDINGS: There is a mildly displaced mildly comminuted fracture involving the distal left clavicle, with mild inferior displacement of the distal clavicular fragment. The left acromioclavicular joint is unremarkable in appearance. Displaced fractures of the left second through sixth posterior ribs are again noted. The lungs are hyperexpanded, with flattening of the hemidiaphragms, compatible with COPD. There is no evidence of focal opacification, pleural effusion or pneumothorax. Cardiomediastinal silhouette is within normal limits. No acute osseous abnormalities are seen. IMPRESSION: 1. Mildly displaced mildly comminuted fracture involving the distal left clavicle, with  mild inferior displacement of the distal clavicular fragment. 2. Displaced fractures of the left second through sixth posterior ribs again seen. 3. Findings of COPD.  Lungs remain grossly clear. Electronically Signed   By: Roanna Raider M.D.   On: 08/10/2015 23:01   Ct Head Wo Contrast  08/10/2015  CLINICAL DATA:  Status post fall. Concern for head injury. Neck pain. Initial encounter. EXAM: CT HEAD WITHOUT CONTRAST CT CERVICAL SPINE WITHOUT CONTRAST TECHNIQUE: Multidetector CT imaging of the head and cervical spine was performed following the  standard protocol without intravenous contrast. Multiplanar CT image reconstructions of the cervical spine were also generated. COMPARISON:  CT of the head performed 11/19/2011, and MRI of the brain performed 11/20/2011 FINDINGS: CT HEAD FINDINGS There is no evidence of acute infarction, mass lesion, or intra- or extra-axial hemorrhage on CT. Prominence of the ventricles and sulci reflects mild cortical volume loss. Mild cerebellar atrophy is noted. Scattered periventricular white matter change likely reflects small vessel ischemic microangiopathy. The brainstem and fourth ventricle are within normal limits. The basal ganglia are unremarkable in appearance. The cerebral hemispheres demonstrate grossly normal gray-white differentiation. No mass effect or midline shift is seen. There is no evidence of fracture; visualized osseous structures are unremarkable in appearance. The visualized portions of the orbits are within normal limits. The paranasal sinuses and mastoid air cells are well-aerated. No significant soft tissue abnormalities are seen. CT CERVICAL SPINE FINDINGS There is no evidence of acute fracture or subluxation. Vertebral bodies demonstrate normal height. Mild multilevel disc space narrowing is noted along the lower cervical spine, with small anterior and posterior disc osteophyte complexes. There is mild grade 1 anterolisthesis of C7 on T1, with underlying facet disease. Prevertebral soft tissues are within normal limits. The thyroid gland is unremarkable in appearance. The visualized lung apices are clear. No significant soft tissue abnormalities are seen. IMPRESSION: 1. No evidence of traumatic intracranial injury or fracture. 2. No evidence of fracture or subluxation along the cervical spine. 3. Mild cortical volume loss and scattered small vessel ischemic microangiopathy. 4. Mild degenerative change along the lower cervical spine. Electronically Signed   By: Roanna Raider M.D.   On: 08/10/2015  22:59   Ct Cervical Spine Wo Contrast  08/10/2015  CLINICAL DATA:  Status post fall. Concern for head injury. Neck pain. Initial encounter. EXAM: CT HEAD WITHOUT CONTRAST CT CERVICAL SPINE WITHOUT CONTRAST TECHNIQUE: Multidetector CT imaging of the head and cervical spine was performed following the standard protocol without intravenous contrast. Multiplanar CT image reconstructions of the cervical spine were also generated. COMPARISON:  CT of the head performed 11/19/2011, and MRI of the brain performed 11/20/2011 FINDINGS: CT HEAD FINDINGS There is no evidence of acute infarction, mass lesion, or intra- or extra-axial hemorrhage on CT. Prominence of the ventricles and sulci reflects mild cortical volume loss. Mild cerebellar atrophy is noted. Scattered periventricular white matter change likely reflects small vessel ischemic microangiopathy. The brainstem and fourth ventricle are within normal limits. The basal ganglia are unremarkable in appearance. The cerebral hemispheres demonstrate grossly normal gray-white differentiation. No mass effect or midline shift is seen. There is no evidence of fracture; visualized osseous structures are unremarkable in appearance. The visualized portions of the orbits are within normal limits. The paranasal sinuses and mastoid air cells are well-aerated. No significant soft tissue abnormalities are seen. CT CERVICAL SPINE FINDINGS There is no evidence of acute fracture or subluxation. Vertebral bodies demonstrate normal height. Mild multilevel disc space narrowing is noted along the  lower cervical spine, with small anterior and posterior disc osteophyte complexes. There is mild grade 1 anterolisthesis of C7 on T1, with underlying facet disease. Prevertebral soft tissues are within normal limits. The thyroid gland is unremarkable in appearance. The visualized lung apices are clear. No significant soft tissue abnormalities are seen. IMPRESSION: 1. No evidence of traumatic  intracranial injury or fracture. 2. No evidence of fracture or subluxation along the cervical spine. 3. Mild cortical volume loss and scattered small vessel ischemic microangiopathy. 4. Mild degenerative change along the lower cervical spine. Electronically Signed   By: Roanna Raider M.D.   On: 08/10/2015 22:59   Dg Shoulder Left  08/10/2015  CLINICAL DATA:  Larey Seat 2 days ago. Left shoulder pain. Initial encounter. EXAM: LEFT SHOULDER - 2+ VIEW COMPARISON:  None. FINDINGS: Mildly displaced fracture of the distal left clavicle noted. No other fractures identified. No evidence of shoulder dislocation. Generalized osteopenia noted. In addition, minimally displaced fractures are seen involving the root left posterior second, third, fourth, fifth, and sixth ribs. No pneumothorax visualized on this exam. IMPRESSION: Distal left clavicle fracture. Minimally displaced fractures involving the left posterior second through sixth ribs. Osteopenia. Electronically Signed   By: Myles Rosenthal M.D.   On: 08/10/2015 20:49    Positive ROS: All other systems have been reviewed and were otherwise negative with the exception of those mentioned in the HPI and as above.  Physical Exam: General: Alert, no acute distress, alert and cooperative Respiratory: No distress or use of accessory muscles. Skin: No lesions in the area of chief complaint Neurologic: Sensation intact distally Psychiatric: Patientwith normal mood and affect  MUSCULOSKELETAL: Left shoulder: Patient's skin is intact. She has ecchymosis near the distal clavicle at the level of the fracture. There is no deformity or step-off of the clavicle. She has point tenderness over the distal clavicle. She has intact sensation light touch throughout the left upper extremity. She is a palpable radial pulse. She can flex and extend all digits of the left hand, flex and extend her wrist and flex and extend her elbow. Shoulder motion is limited secondary to  pain.  Assessment: Left minimally displaced distal clavicle fracture  Plan: I explained to the patient that she has a fracture to her clavicle (collarbone).  I explained that was minimally displaced and I do not predict this will require surgical intervention. I'm going to order her a sling to help support her left upper extremity. I recommend she avoid weightbearing or overhead activity but may use the left arm as her pain allows. Patient should follow up my office in 2 weeks for further evaluation and x-ray. Patient understood and agreed with this plan.    Juanell Fairly, MD    08/11/2015 1:28 PM

## 2015-08-11 NOTE — ED Notes (Signed)
Per Dr. Sheryle Hail, initiate cardiac monitoring once PCA started.  Okay for pt to go to floor w/o cardiac monitoring.

## 2015-08-12 DIAGNOSIS — S2232XA Fracture of one rib, left side, initial encounter for closed fracture: Secondary | ICD-10-CM | POA: Diagnosis not present

## 2015-08-12 LAB — URINE CULTURE: Culture: NO GROWTH

## 2015-08-12 MED ORDER — CEFUROXIME AXETIL 500 MG PO TABS
500.0000 mg | ORAL_TABLET | Freq: Two times a day (BID) | ORAL | Status: AC
Start: 2015-08-12 — End: 2015-08-17

## 2015-08-12 MED ORDER — HYDROCODONE-ACETAMINOPHEN 5-325 MG PO TABS: 1.0000 | ORAL_TABLET | ORAL | Status: DC | PRN

## 2015-08-12 MED ORDER — IBUPROFEN 400 MG PO TABS: 400.0000 mg | ORAL_TABLET | Freq: Three times a day (TID) | ORAL | Status: DC | PRN

## 2015-08-12 MED ORDER — DOCUSATE SODIUM 100 MG PO CAPS: 100.0000 mg | ORAL_CAPSULE | Freq: Two times a day (BID) | ORAL | Status: DC

## 2015-08-12 NOTE — Discharge Summary (Signed)
Kelly Wright, 82 y.o., DOB 22-Jan-1933, MRN [id number]. Admission date: 08/10/2015 Discharge Date 08/12/2015 Primary MD Leanna Sato, MD Admitting Physician Arnaldo Natal, MD  Admission Diagnosis  UTI (lower urinary tract infection) [N39.0] Clavicle fracture, right, closed, initial encounter [S42.001A] Multiple rib fractures, right, closed, initial encounter [S22.41XA]  Discharge Diagnosis   Active Problems: Left  Rib fracture due to fall Distal left clavicular fracture History of CVA Atrial fibrillation Arthritis History of DVT Dysrhythmia Cystitis present on admission       Hospital Course The patient presents emergency department complaining of pain in her chest over the area of her broken ribs. Patient had following day prior. She was also noticed to have left clavicular fracture. She was admitted to the hospital for pain control. She was also seen by orthopedics. They recommended sling and outpatient follow-up. Patient is pain is under much better control. She also was noted to have cystitis which was treated with antibiotics. Patient is doing well and stable for discharge.            Consults  orthopedic surgery  Significant Tests:  See full reports for all details    Dg Chest 2 View  08/10/2015  CLINICAL DATA:  Acute onset of left shoulder pain after fall. Initial encounter. EXAM: CHEST  2 VIEW COMPARISON:  None. FINDINGS: There is a mildly displaced mildly comminuted fracture involving the distal left clavicle, with mild inferior displacement of the distal clavicular fragment. The left acromioclavicular joint is unremarkable in appearance. Displaced fractures of the left second through sixth posterior ribs are again noted. The lungs are hyperexpanded, with flattening of the hemidiaphragms, compatible with COPD. There is no evidence of focal opacification, pleural effusion or pneumothorax. Cardiomediastinal silhouette is within normal limits. No acute osseous  abnormalities are seen. IMPRESSION: 1. Mildly displaced mildly comminuted fracture involving the distal left clavicle, with mild inferior displacement of the distal clavicular fragment. 2. Displaced fractures of the left second through sixth posterior ribs again seen. 3. Findings of COPD.  Lungs remain grossly clear. Electronically Signed   By: Roanna Raider M.D.   On: 08/10/2015 23:01   Ct Head Wo Contrast  08/10/2015  CLINICAL DATA:  Status post fall. Concern for head injury. Neck pain. Initial encounter. EXAM: CT HEAD WITHOUT CONTRAST CT CERVICAL SPINE WITHOUT CONTRAST TECHNIQUE: Multidetector CT imaging of the head and cervical spine was performed following the standard protocol without intravenous contrast. Multiplanar CT image reconstructions of the cervical spine were also generated. COMPARISON:  CT of the head performed 11/19/2011, and MRI of the brain performed 11/20/2011 FINDINGS: CT HEAD FINDINGS There is no evidence of acute infarction, mass lesion, or intra- or extra-axial hemorrhage on CT. Prominence of the ventricles and sulci reflects mild cortical volume loss. Mild cerebellar atrophy is noted. Scattered periventricular white matter change likely reflects small vessel ischemic microangiopathy. The brainstem and fourth ventricle are within normal limits. The basal ganglia are unremarkable in appearance. The cerebral hemispheres demonstrate grossly normal gray-white differentiation. No mass effect or midline shift is seen. There is no evidence of fracture; visualized osseous structures are unremarkable in appearance. The visualized portions of the orbits are within normal limits. The paranasal sinuses and mastoid air cells are well-aerated. No significant soft tissue abnormalities are seen. CT CERVICAL SPINE FINDINGS There is no evidence of acute fracture or subluxation. Vertebral bodies demonstrate normal height. Mild multilevel disc space narrowing is noted along the lower cervical spine, with  small anterior and posterior disc osteophyte complexes.  There is mild grade 1 anterolisthesis of C7 on T1, with underlying facet disease. Prevertebral soft tissues are within normal limits. The thyroid gland is unremarkable in appearance. The visualized lung apices are clear. No significant soft tissue abnormalities are seen. IMPRESSION: 1. No evidence of traumatic intracranial injury or fracture. 2. No evidence of fracture or subluxation along the cervical spine. 3. Mild cortical volume loss and scattered small vessel ischemic microangiopathy. 4. Mild degenerative change along the lower cervical spine. Electronically Signed   By: Roanna Raider M.D.   On: 08/10/2015 22:59   Ct Cervical Spine Wo Contrast  08/10/2015  CLINICAL DATA:  Status post fall. Concern for head injury. Neck pain. Initial encounter. EXAM: CT HEAD WITHOUT CONTRAST CT CERVICAL SPINE WITHOUT CONTRAST TECHNIQUE: Multidetector CT imaging of the head and cervical spine was performed following the standard protocol without intravenous contrast. Multiplanar CT image reconstructions of the cervical spine were also generated. COMPARISON:  CT of the head performed 11/19/2011, and MRI of the brain performed 11/20/2011 FINDINGS: CT HEAD FINDINGS There is no evidence of acute infarction, mass lesion, or intra- or extra-axial hemorrhage on CT. Prominence of the ventricles and sulci reflects mild cortical volume loss. Mild cerebellar atrophy is noted. Scattered periventricular white matter change likely reflects small vessel ischemic microangiopathy. The brainstem and fourth ventricle are within normal limits. The basal ganglia are unremarkable in appearance. The cerebral hemispheres demonstrate grossly normal gray-white differentiation. No mass effect or midline shift is seen. There is no evidence of fracture; visualized osseous structures are unremarkable in appearance. The visualized portions of the orbits are within normal limits. The paranasal sinuses  and mastoid air cells are well-aerated. No significant soft tissue abnormalities are seen. CT CERVICAL SPINE FINDINGS There is no evidence of acute fracture or subluxation. Vertebral bodies demonstrate normal height. Mild multilevel disc space narrowing is noted along the lower cervical spine, with small anterior and posterior disc osteophyte complexes. There is mild grade 1 anterolisthesis of C7 on T1, with underlying facet disease. Prevertebral soft tissues are within normal limits. The thyroid gland is unremarkable in appearance. The visualized lung apices are clear. No significant soft tissue abnormalities are seen. IMPRESSION: 1. No evidence of traumatic intracranial injury or fracture. 2. No evidence of fracture or subluxation along the cervical spine. 3. Mild cortical volume loss and scattered small vessel ischemic microangiopathy. 4. Mild degenerative change along the lower cervical spine. Electronically Signed   By: Roanna Raider M.D.   On: 08/10/2015 22:59   Dg Shoulder Left  08/10/2015  CLINICAL DATA:  Larey Seat 2 days ago. Left shoulder pain. Initial encounter. EXAM: LEFT SHOULDER - 2+ VIEW COMPARISON:  None. FINDINGS: Mildly displaced fracture of the distal left clavicle noted. No other fractures identified. No evidence of shoulder dislocation. Generalized osteopenia noted. In addition, minimally displaced fractures are seen involving the root left posterior second, third, fourth, fifth, and sixth ribs. No pneumothorax visualized on this exam. IMPRESSION: Distal left clavicle fracture. Minimally displaced fractures involving the left posterior second through sixth ribs. Osteopenia. Electronically Signed   By: Myles Rosenthal M.D.   On: 08/10/2015 20:49       Today   Subjective:   Kathie Rhodes Goswami pain under control  Objective:   Blood pressure 103/42, pulse 64, temperature 98.1 F (36.7 C), temperature source Oral, resp. rate 18, height  (1.753 m), weight 56.745 kg (125 lb 1.6 oz), SpO2 97 %.   .  Intake/Output Summary (Last 24 hours) at 08/12/15 1401 Last  data filed at 08/12/15 1136  Gross per 24 hour  Intake    720 ml  Output      0 ml  Net    720 ml    Exam VITAL SIGNS: Blood pressure 103/42, pulse 64, temperature 98.1 F (36.7 C), temperature source Oral, resp. rate 18, height 5\' 9"  (1.753 m), weight 56.745 kg (125 lb 1.6 oz), SpO2 97 %.  GENERAL:  82 y.o.-year-old patient lying in the bed with no acute distress.  EYES: Pupils equal, round, reactive to light and accommodation. No scleral icterus. Extraocular muscles intact.  HEENT: Head atraumatic, normocephalic. Oropharynx and nasopharynx clear.  NECK:  Supple, no jugular venous distention. No thyroid enlargement, no tenderness.  LUNGS: Normal breath sounds bilaterally, no wheezing, rales,rhonchi or crepitation. No use of accessory muscles of respiration.  CARDIOVASCULAR: S1, S2 normal. No murmurs, rubs, or gallops.  ABDOMEN: Soft, nontender, nondistended. Bowel sounds present. No organomegaly or mass.  EXTREMITIES: No pedal edema, cyanosis, or clubbing. Left arm sling NEUROLOGIC: Cranial nerves II through XII are intact. Muscle strength 5/5 in all extremities. Sensation intact. Gait not checked.  PSYCHIATRIC: The patient is alert and oriented x 3.  SKIN: No obvious rash, lesion, or ulcer.   Data Review     CBC w Diff:  Lab Results  Component Value Date   WBC 5.6 08/10/2015   WBC 4.0 04/03/2013   HGB 12.8 08/10/2015   HGB 12.4 04/03/2013   HCT 38.8 08/10/2015   HCT 36.8 04/03/2013   PLT 140* 08/10/2015   PLT 152 04/03/2013   LYMPHOPCT 20 08/10/2015   MONOPCT 8 08/10/2015   EOSPCT 1 08/10/2015   BASOPCT 1 08/10/2015   CMP:  Lab Results  Component Value Date   NA 140 08/10/2015   NA 141 04/03/2013   K 3.5 08/10/2015   K 3.8 04/03/2013   CL 104 08/10/2015   CL 108* 04/03/2013   CO2 29 08/10/2015   CO2 27 04/03/2013   BUN 16 08/10/2015   BUN 20* 04/03/2013   CREATININE 0.77 08/10/2015    CREATININE 0.97 04/03/2013   PROT 6.8 11/11/2014   PROT 6.3* 04/03/2013   ALBUMIN 4.0 11/11/2014   ALBUMIN 3.4 04/03/2013   BILITOT 1.3* 11/11/2014   BILITOT 0.4 04/03/2013   ALKPHOS 47 11/11/2014   ALKPHOS 54 04/03/2013   AST 28 11/11/2014   AST 23 04/03/2013   ALT 16 11/11/2014   ALT 19 04/03/2013  .  Micro Results Recent Results (from the past 240 hour(s))  Urine culture     Status: None   Collection Time: 08/10/15 10:34 PM  Result Value Ref Range Status   Specimen Description URINE, RANDOM  Final   Special Requests NONE  Final   Culture NO GROWTH 1 DAY  Final   Report Status 08/12/2015 FINAL  Final        Code Status Orders        Start     Ordered   08/11/15 0115  Full code   Continuous     08/11/15 0114    Code Status History    Date Active Date Inactive Code Status Order ID Comments User Context   11/12/2014  2:35 AM 11/16/2014  9:54 PM Full Code 01/16/2015  [id number], MD Inpatient              Follow-up Information    Follow up with Vernell Barrier, MD In 7 days.   Specialty:  Orthopedic Surgery   Contact information:  [address] [place] [address] [phone]       Follow up with Leanna Sato, MD In 7 days.   Specialty:  Family Medicine   Contact information:   [address] [place] [address] [phone]       Discharge Medications     Medication List    STOP taking these medications        esomeprazole 20 MG capsule  Commonly known as:  NEXIUM     feeding supplement (ENSURE ENLIVE) Liqd     lisinopril 10 MG tablet  Commonly known as:  PRINIVIL,ZESTRIL     loratadine 10 MG tablet  Commonly known as:  CLARITIN     rivaroxaban 20 MG Tabs tablet  Commonly known as:  XARELTO     simvastatin 20 MG tablet  Commonly known as:  ZOCOR      TAKE these medications        atorvastatin 10 MG tablet  Commonly known as:  LIPITOR  Take 10 mg by mouth daily.     cefUROXime 500 MG tablet  Commonly  known as:  CEFTIN  Take 1 tablet (500 mg total) by mouth 2 (two) times daily with a meal.     citalopram 20 MG tablet  Commonly known as:  CELEXA  Take 20 mg by mouth daily.     docusate sodium 100 MG capsule  Commonly known as:  COLACE  Take 1 capsule (100 mg total) by mouth 2 (two) times daily.     folic acid 1 MG tablet  Commonly known as:  FOLVITE  Take 1 mg by mouth daily.     HYDROcodone-acetaminophen 5-325 MG tablet  Commonly known as:  NORCO/VICODIN  Take 1 tablet by mouth every 4 (four) hours as needed for moderate pain.     ibuprofen 400 MG tablet  Commonly known as:  ADVIL,MOTRIN  Take 1 tablet (400 mg total) by mouth every 8 (eight) hours as needed.     methotrexate 2.5 MG tablet  Commonly known as:  RHEUMATREX  Take 12.5 mg by mouth once a week. Caution:Chemotherapy. Protect from light. Pt takes on Wednesday           Total Time in preparing paper work, data evaluation and todays exam - 35 minutes  Auburn Bilberry M.D on 08/12/2015 at 2:01 [facility]  [facility]   Office  [phone]

## 2015-08-12 NOTE — Care Management (Addendum)
Met again with patient who has not talked to her children- she agrees to making home arrangements with her children. HHPT and [facility]NA arranged through [facility] after meeting with patient again. Her husband Trilby Drummer had home health though them "years ago" and she would like them for herself. No further RNCM needs. Case closed.  Spoke with her daughter and she said that she has "sitters every day". Daughter states she is a [employer] scientist and she will arrange 24/7 care. She states patient has been receiving care "for over a year".

## 2015-08-12 NOTE — Care Management Note (Signed)
Case Management Note  Patient Details  Name: Kelly Wright MRN: [id number] Date of Birth: 11-Jun-1933  Subjective/Objective:                  Met with patient to discuss discharge planning. She lives alone and has 4 children. She will check with them to see if they can assist her. She is aware that she may discharge home today. She states she has a walker. She has no home health agency preference. Her PCP is with [facility].   Action/Plan: List of home health agencies left with patient. She will contact children to discuss agency. RNCM will continue to follow.    Expected Discharge Date:                  Expected Discharge Plan:     In-House Referral:     Discharge planning Services  CM Consult  Post Acute Care Choice:  Home Health Choice offered to:  Patient  DME Arranged:    DME Agency:     HH Arranged:    [place] Agency:     Status of Service:  In process, will continue to follow  Medicare Important Message Given:    Date Medicare IM Given:    Medicare IM give by:    Date Additional Medicare IM Given:    Additional Medicare Important Message give by:     If discussed at [place] of Stay Meetings, dates discussed:    Additional Comments:  Marshell Garfinkel, RN 08/12/2015, 9:41 AM

## 2015-08-12 NOTE — Discharge Instructions (Signed)
°  DIET:  Cardiac diet  DISCHARGE CONDITION:  Stable  ACTIVITY:  Activity as tolerated, keep arm in sling as per ortho  OXYGEN:  Home Oxygen: No.   Oxygen Delivery: room air  DISCHARGE LOCATION:  nursing home    ADDITIONAL DISCHARGE INSTRUCTION:   If you experience worsening of your admission symptoms, develop shortness of breath, life threatening emergency, suicidal or homicidal thoughts you must seek medical attention immediately by calling 911 or calling your MD immediately  if symptoms less severe.  You Must read complete instructions/literature along with all the possible adverse reactions/side effects for all the Medicines you take and that have been prescribed to you. Take any new Medicines after you have completely understood and accpet all the possible adverse reactions/side effects.   Please note  You were cared for by a hospitalist during your hospital stay. If you have any questions about your discharge medications or the care you received while you were in the hospital after you are discharged, you can call the unit and asked to speak with the hospitalist on call if the hospitalist that took care of you is not available. Once you are discharged, your primary care physician will handle any further medical issues. Please note that NO REFILLS for any discharge medications will be authorized once you are discharged, as it is imperative that you return to your primary care physician (or establish a relationship with a primary care physician if you do not have one) for your aftercare needs so that they can reassess your need for medications and monitor your lab values.

## 2015-08-14 NOTE — Care Management (Signed)
Post discharge: 08/14/15 Daughter called stating that "she received Rx for Vicodin but not Ceftin which is on discharge orders". I spoke with discharging MD Dr. Auburn Bilberry and he said that patient's UA was negative and that Ceftin was discontinued. I have notified daughter that states [facility] patient's meds now. She appreciated the call back. She states that "things are going well". This RNCM asked that she call with any future questions or concerns- she agreed.

## 2015-08-21 NOTE — ED Provider Notes (Addendum)
DELAYED ENTRY ----------------------------------------- 9:36 AM on 08/21/2015 -----------------------------------------    [facility] Emergency Department Provider Note  ____________________________________________   I have reviewed the triage vital signs and the nursing notes.   HISTORY  Chief Complaint Shoulder Injury and Rib Injury    HPI Kelly Wright is a 82 y.o. female who is very high functioning, has a history dementia, but does not believe it was a syncopal fall. Patient fell on the stairs in her carport. She has pain to her left shoulder and left ribs. She denies loss of consciousness, significant headache, numbness or weakness. It was not a syncopal fall to the extent that she can determine.  Past Medical History  Diagnosis Date  . Cerebral vascular accident (HCC)   . Atrial fibrillation (HCC)   . Arthritis   . DVT (deep venous thrombosis) (HCC)   . Dysrhythmia     Patient Active Problem List   Diagnosis Date Noted  . Broken ribs 08/11/2015  . UTI (lower urinary tract infection) 11/12/2014  . Encephalopathy, metabolic 11/12/2014  . Rheumatoid arthritis (HCC) 11/12/2014  . Chronic anticoagulation 11/12/2014  . Atrial fibrillation, unspecified 11/12/2014  . H/O: CVA (cerebrovascular accident) 11/12/2014    Past Surgical History  Procedure Laterality Date  . Abdominal hysterectomy      Current Outpatient Rx  Name  Route  Sig  Dispense  Refill  . atorvastatin (LIPITOR) 10 MG tablet   Oral   Take 10 mg by mouth daily.         . citalopram (CELEXA) 20 MG tablet   Oral   Take 20 mg by mouth daily.         . folic acid (FOLVITE) 1 MG tablet   Oral   Take 1 mg by mouth daily.         . methotrexate (RHEUMATREX) 2.5 MG tablet   Oral   Take 12.5 mg by mouth once a week. Caution:Chemotherapy. Protect from light. Pt takes on Wednesday         . docusate sodium (COLACE) 100 MG capsule   Oral   Take 1 capsule (100 mg  total) by mouth 2 (two) times daily.   10 capsule   0   . HYDROcodone-acetaminophen (NORCO/VICODIN) 5-325 MG tablet   Oral   Take 1 tablet by mouth every 4 (four) hours as needed for moderate pain.   30 tablet   0   . ibuprofen (ADVIL,MOTRIN) 400 MG tablet   Oral   Take 1 tablet (400 mg total) by mouth every 8 (eight) hours as needed.   30 tablet   0     Allergies Review of patient's allergies indicates no known allergies.  Family History  Problem Relation Age of Onset  . Hodgkin's lymphoma Mother   . Cancer - Lung Father   . CVA Other     Social History Social History  Substance Use Topics  . Smoking status: Never Smoker   . Smokeless tobacco: None  . Alcohol Use: No    Review of Systems Constitutional: No fever/chills Eyes: No visual changes. ENT: No sore throat. No stiff neck no neck pain Cardiovascular: Denies chest pain. Except for left-sided chest wall pain Respiratory: Denies shortness of breath. Gastrointestinal:   no vomiting.  No diarrhea.  No constipation. Genitourinary: Negative for dysuria. Musculoskeletal: Negative lower extremity swelling Skin: Negative for rash. Neurological: Negative for headaches, focal weakness or numbness. 10-point ROS otherwise negative.  ____________________________________________   PHYSICAL EXAM:  VITAL  SIGNS: ED Triage Vitals  Enc Vitals Group     BP 08/10/15 2010 124/58 mmHg     Pulse Rate 08/10/15 2010 62     Resp 08/10/15 2010 20     Temp 08/10/15 2010 99.3 F (37.4 C)     Temp Source 08/10/15 2010 Oral     SpO2 08/10/15 2010 100 %     Weight 08/10/15 2010 130 lb (58.968 kg)     Height 08/10/15 2010 5\' 9"  (1.753 m)     Head Cir --      Peak Flow --      Pain Score 08/10/15 2142 5     Pain Loc --      Pain Edu? --      Excl. in GC? --     Constitutional: Alert and oriented. Well appearing and in no acute distress. Eyes: Conjunctivae are normal. PERRL. EOMI. Head: Atraumatic. Nose: No  congestion/rhinnorhea. Mouth/Throat: Mucous membranes are moist.  Oropharynx non-erythematous. Neck: No stridor.   Nontender with no meningismus Cardiovascular: Normal rate, regular rhythm. Grossly normal heart sounds.  Good peripheral circulation. Respiratory: Normal respiratory effort.  No retractions. Lungs CTAB. Abdominal: Soft and nontender. No distention. No guarding no rebound Back: Diffuse tenderness noted to the left side of the chest wall as well as to the clavicle. or step off there is no midline tenderness there are no lesions noted. there is no CVA tenderness Musculoskeletal: No lower extremity tenderness. No joint effusions, no DVT signs strong distal pulses no edema, positive pain in the left clavicle Neurologic:  Normal speech and language. No gross focal neurologic deficits are appreciated.  Skin:  Skin is warm, dry and intact. No rash noted. Psychiatric: Mood and affect are normal. Speech and behavior are normal.  ____________________________________________   LABS (all labs ordered are listed, but only abnormal results are displayed)  Labs Reviewed  BASIC METABOLIC PANEL - Abnormal; Notable for the following:    Glucose, Bld 107 (*)    All other components within normal limits  CBC WITH DIFFERENTIAL/PLATELET - Abnormal; Notable for the following:    RDW 15.2 (*)    Platelets 140 (*)    All other components within normal limits  URINALYSIS COMPLETEWITH MICROSCOPIC ([facility] ONLY) - Abnormal; Notable for the following:    Color, Urine YELLOW (*)    APPearance HAZY (*)    Ketones, ur TRACE (*)    Leukocytes, UA 3+ (*)    Squamous Epithelial / LPF 0-5 (*)    All other components within normal limits  URINE CULTURE  TSH   ____________________________________________  EKG  I personally interpreted any EKGs ordered by me or triage  ____________________________________________  RADIOLOGY  I reviewed any imaging ordered by me or triage that were performed during my  shift ____________________________________________   PROCEDURES  Procedure(s) performed: None  Critical Care performed: None  ____________________________________________   INITIAL IMPRESSION / ASSESSMENT AND PLAN / ED COURSE  Pertinent labs & imaging results that were available during my care of the patient were reviewed by me and considered in my medical decision making (see chart for details).  Patient with a likely non-syncopal fall diffuse workup. Views multiple rib factors of a clavicular fracture. Patient pain well controlled in the emergency department. Admitted for further observation for multiple rib fractures. No evidence of acute intracranial injury. Patient does appear to have a urinary tract infection which are treating. ____________________________________________   FINAL CLINICAL IMPRESSION(S) / ED DIAGNOSES  Final diagnoses:  Multiple  rib fractures, right, closed, initial encounter  Clavicle fracture, right, closed, initial encounter  UTI (lower urinary tract infection)      This chart was dictated using voice recognition software.  Despite best efforts to proofread,  errors can occur which can change meaning.    Jeanmarie Plant, MD 08/21/15 6720  Jeanmarie Plant, MD 08/21/15 [phone]

## 2016-02-20 ENCOUNTER — Encounter: Payer: Self-pay | Admitting: Emergency Medicine

## 2016-02-20 ENCOUNTER — Observation Stay: Payer: Medicare Other

## 2016-02-20 ENCOUNTER — Observation Stay
Admission: EM | Admit: 2016-02-20 | Discharge: 2016-02-22 | Disposition: A | Discharge status: Home / Self Care | Payer: Medicare Other | Attending: Specialist | Admitting: Specialist

## 2016-02-20 ENCOUNTER — Emergency Department: Payer: Medicare Other

## 2016-02-20 DIAGNOSIS — R32 Unspecified urinary incontinence: Secondary | ICD-10-CM | POA: Insufficient documentation

## 2016-02-20 DIAGNOSIS — I739 Peripheral vascular disease, unspecified: Secondary | ICD-10-CM | POA: Insufficient documentation

## 2016-02-20 DIAGNOSIS — F329 Major depressive disorder, single episode, unspecified: Secondary | ICD-10-CM | POA: Diagnosis not present

## 2016-02-20 DIAGNOSIS — G459 Transient cerebral ischemic attack, unspecified: Principal | ICD-10-CM | POA: Insufficient documentation

## 2016-02-20 DIAGNOSIS — Z79899 Other long term (current) drug therapy: Secondary | ICD-10-CM | POA: Diagnosis not present

## 2016-02-20 DIAGNOSIS — Z801 Family history of malignant neoplasm of trachea, bronchus and lung: Secondary | ICD-10-CM | POA: Diagnosis not present

## 2016-02-20 DIAGNOSIS — Z7901 Long term (current) use of anticoagulants: Secondary | ICD-10-CM | POA: Insufficient documentation

## 2016-02-20 DIAGNOSIS — Z8673 Personal history of transient ischemic attack (TIA), and cerebral infarction without residual deficits: Secondary | ICD-10-CM | POA: Diagnosis not present

## 2016-02-20 DIAGNOSIS — Z86718 Personal history of other venous thrombosis and embolism: Secondary | ICD-10-CM | POA: Insufficient documentation

## 2016-02-20 DIAGNOSIS — G9341 Metabolic encephalopathy: Secondary | ICD-10-CM | POA: Diagnosis not present

## 2016-02-20 DIAGNOSIS — Z9841 Cataract extraction status, right eye: Secondary | ICD-10-CM | POA: Diagnosis not present

## 2016-02-20 DIAGNOSIS — Z23 Encounter for immunization: Secondary | ICD-10-CM | POA: Insufficient documentation

## 2016-02-20 DIAGNOSIS — R4781 Slurred speech: Secondary | ICD-10-CM | POA: Diagnosis present

## 2016-02-20 DIAGNOSIS — F0390 Unspecified dementia without behavioral disturbance: Secondary | ICD-10-CM | POA: Diagnosis not present

## 2016-02-20 DIAGNOSIS — I081 Rheumatic disorders of both mitral and tricuspid valves: Secondary | ICD-10-CM | POA: Insufficient documentation

## 2016-02-20 DIAGNOSIS — Z823 Family history of stroke: Secondary | ICD-10-CM | POA: Diagnosis not present

## 2016-02-20 DIAGNOSIS — J449 Chronic obstructive pulmonary disease, unspecified: Secondary | ICD-10-CM | POA: Insufficient documentation

## 2016-02-20 DIAGNOSIS — M069 Rheumatoid arthritis, unspecified: Secondary | ICD-10-CM | POA: Diagnosis present

## 2016-02-20 DIAGNOSIS — Z90710 Acquired absence of both cervix and uterus: Secondary | ICD-10-CM | POA: Insufficient documentation

## 2016-02-20 DIAGNOSIS — F32A Depression, unspecified: Secondary | ICD-10-CM | POA: Diagnosis present

## 2016-02-20 DIAGNOSIS — N390 Urinary tract infection, site not specified: Secondary | ICD-10-CM | POA: Diagnosis not present

## 2016-02-20 DIAGNOSIS — I4891 Unspecified atrial fibrillation: Secondary | ICD-10-CM | POA: Diagnosis present

## 2016-02-20 DIAGNOSIS — Z9842 Cataract extraction status, left eye: Secondary | ICD-10-CM | POA: Diagnosis not present

## 2016-02-20 DIAGNOSIS — R531 Weakness: Secondary | ICD-10-CM

## 2016-02-20 DIAGNOSIS — Z7982 Long term (current) use of aspirin: Secondary | ICD-10-CM | POA: Insufficient documentation

## 2016-02-20 DIAGNOSIS — Z807 Family history of other malignant neoplasms of lymphoid, hematopoietic and related tissues: Secondary | ICD-10-CM | POA: Diagnosis not present

## 2016-02-20 DIAGNOSIS — I491 Atrial premature depolarization: Secondary | ICD-10-CM | POA: Insufficient documentation

## 2016-02-20 LAB — URINALYSIS COMPLETE WITH MICROSCOPIC (ARMC ONLY)
BACTERIA UA: NONE SEEN
Bilirubin Urine: NEGATIVE
GLUCOSE, UA: 50 mg/dL — AB
HGB URINE DIPSTICK: NEGATIVE
Ketones, ur: NEGATIVE mg/dL
LEUKOCYTES UA: NEGATIVE
Nitrite: NEGATIVE
Protein, ur: 30 mg/dL — AB
Specific Gravity, Urine: 1.023 (ref 1.005–1.030)
pH: 5 (ref 5.0–8.0)

## 2016-02-20 LAB — APTT

## 2016-02-20 LAB — CBC
HEMATOCRIT: 39.4 % (ref 35.0–47.0)
HEMOGLOBIN: 13.5 g/dL (ref 12.0–16.0)
MCH: 33.8 pg (ref 26.0–34.0)
MCHC: 34.2 g/dL (ref 32.0–36.0)
MCV: 99 fL (ref 80.0–100.0)
Platelets: 151 10*3/uL (ref 150–440)
RBC: 3.98 MIL/uL (ref 3.80–5.20)
RDW: 15.2 % — ABNORMAL HIGH (ref 11.5–14.5)
WBC: 5.4 10*3/uL (ref 3.6–11.0)

## 2016-02-20 LAB — COMPREHENSIVE METABOLIC PANEL
ALK PHOS: 47 U/L (ref 38–126)
ALT: 12 U/L — AB (ref 14–54)
AST: 21 U/L (ref 15–41)
Albumin: 4 g/dL (ref 3.5–5.0)
Anion gap: 8 (ref 5–15)
BILIRUBIN TOTAL: 0.8 mg/dL (ref 0.3–1.2)
BUN: 17 mg/dL (ref 6–20)
CALCIUM: 9.3 mg/dL (ref 8.9–10.3)
CHLORIDE: 104 mmol/L (ref 101–111)
CO2: 29 mmol/L (ref 22–32)
CREATININE: 0.89 mg/dL (ref 0.44–1.00)
GFR, EST NON AFRICAN AMERICAN: 60 mL/min — AB (ref >60)
Glucose, Bld: 152 mg/dL — ABNORMAL HIGH (ref 65–99)
Potassium: 3.5 mmol/L (ref 3.5–5.1)
Sodium: 141 mmol/L (ref 135–145)
TOTAL PROTEIN: 6.7 g/dL (ref 6.5–8.1)

## 2016-02-20 LAB — DIFFERENTIAL
BASOS ABS: 0 10*3/uL (ref 0–0.1)
Basophils Relative: 1 %
Eosinophils Absolute: 0 10*3/uL (ref 0–0.7)
Eosinophils Relative: 1 %
LYMPHS ABS: 1.1 10*3/uL (ref 1.0–3.6)
LYMPHS PCT: 21 %
MONO ABS: 0.4 10*3/uL (ref 0.2–0.9)
MONOS PCT: 7 %
NEUTROS ABS: 3.8 10*3/uL (ref 1.4–6.5)
Neutrophils Relative %: 72 %

## 2016-02-20 LAB — PROTIME-INR
INR: 1.08
Prothrombin Time: 14 seconds (ref 11.4–15.2)

## 2016-02-20 LAB — TROPONIN I

## 2016-02-20 LAB — GLUCOSE, CAPILLARY: GLUCOSE-CAPILLARY: 130 mg/dL — AB (ref 65–99)

## 2016-02-20 MED ORDER — STROKE: EARLY STAGES OF RECOVERY BOOK
Freq: Once | Status: AC
  Administered 2016-02-21

## 2016-02-20 MED ORDER — ENOXAPARIN SODIUM 40 MG/0.4ML ~~LOC~~ SOLN
40.0000 mg | SUBCUTANEOUS | Status: DC
  Administered 2016-02-20 – 2016-02-21 (×2): 40 mg via SUBCUTANEOUS
  Filled 2016-02-20 (×2): qty 0.4

## 2016-02-20 MED ORDER — SODIUM CHLORIDE 0.9 % IV SOLN
INTRAVENOUS | Status: DC
  Administered 2016-02-20: via INTRAVENOUS

## 2016-02-20 NOTE — ED Provider Notes (Signed)
[facility] Emergency Department Provider Note   ____________________________________________   First MD Initiated Contact with Patient 02/20/16 1715     (approximate)  I have reviewed the triage vital signs and the nursing notes.   HISTORY  Chief Complaint Code Stroke  History limited by dementia  HPI Kelly Wright is a 82 y.o. female some dementia some incontinence and not picking up her feet when she walks for several months. Patient was at [facility] with her sitter shopping when she was at the checkout line and felt ill but over the cart put her head down and said she couldn't make it. They found the chair and sat her down and she got very pale and cool or cold and began vomiting up yellow material. Patient then got very slurry speech. Family and said reported her speech is better but still not back to normal. She is not herself still acting weak. Patient herself cannot tell me much.   Past Medical History:  Diagnosis Date  . Arthritis   . Atrial fibrillation (HCC)   . Cerebral vascular accident (HCC)   . DVT (deep venous thrombosis) (HCC)   . Dysrhythmia     Patient Active Problem List   Diagnosis Date Noted  . Broken ribs 08/11/2015  . UTI (lower urinary tract infection) 11/12/2014  . Encephalopathy, metabolic 11/12/2014  . Rheumatoid arthritis (HCC) 11/12/2014  . Chronic anticoagulation 11/12/2014  . Atrial fibrillation, unspecified 11/12/2014  . H/O: CVA (cerebrovascular accident) 11/12/2014    Past Surgical History:  Procedure Laterality Date  . ABDOMINAL HYSTERECTOMY      Prior to Admission medications   Medication Sig Start Date End Date Taking? Authorizing Provider  citalopram (CELEXA) 20 MG tablet Take 30 mg by mouth at bedtime.    Yes Historical Provider, MD  donepezil (ARICEPT) 10 MG tablet Take 10 mg by mouth at bedtime.   Yes Historical Provider, MD  folic acid (FOLVITE) 400 MCG tablet Take 400 mcg by mouth at bedtime.   Yes  Historical Provider, MD  memantine (NAMENDA) 10 MG tablet Take 10 mg by mouth 2 (two) times daily.   Yes Historical Provider, MD  methotrexate (RHEUMATREX) 2.5 MG tablet Take 12.5 mg by mouth every Wednesday.    Yes Historical Provider, MD    Allergies Review of patient's allergies indicates no known allergies.  Family History  Problem Relation Age of Onset  . Hodgkin's lymphoma Mother   . Cancer - Lung Father   . CVA Other     Social History Social History  Substance Use Topics  . Smoking status: Never Smoker  . Smokeless tobacco: Not on file  . Alcohol use No    Review of Systems  Review of systems limited by dementia. Mostly obtained from sitter and family. Constitutional: No fever/chills Eyes: No visual changes. ENT: No sore throat. Cardiovascular: Denies chest pain. Respiratory: Denies shortness of breath. Gastrointestinal: No abdominal pain.   nausea,vomiting.  No diarrhea.  No constipation.  Skin: Negative for rash.  10-point ROS otherwise negative.  ____________________________________________   PHYSICAL EXAM:  VITAL SIGNS: ED Triage Vitals [02/20/16 1656]  Enc Vitals Group     BP (!) 106/49     Pulse Rate 60     Resp 18     Temp 97.6 F (36.4 C)     Temp Source Oral     SpO2 98 %     Weight      Height      Head  Circumference      Peak Flow      Pain Score      Pain Loc      Pain Edu?      Excl. in GC?     Constitutional: Alert and orientedTo person and hospital. Well appearing and in no acute distress. Eyes: Conjunctivae are normal. PERRL. EOMI. Head: Atraumatic. Nose: No congestion/rhinnorhea. Mouth/Throat: Mucous membranes are moist.  Oropharynx non-erythematous. Neck: No stridor.   Cardiovascular: Normal rate, regular rhythm. Grossly normal heart sounds.  Good peripheral circulation. Respiratory: Normal respiratory effort.  No retractions. Lungs CTAB. Gastrointestinal: Soft and nontender. No distention. No abdominal bruits. No CVA  tenderness. Musculoskeletal: No lower extremity tenderness nor edema.  No joint effusions. Neurologic:  Normal speech and language. No gross focal neurologic deficits are appreciated. Specifically cranial nerves II through XII appear to be intact cerebellar finger-nose and rapid alternating movement appears to be normal ears no apparent motor weakness and sensation appears to be normal as well  Skin:  Skin is warm, dry and intact. No rash noted.   ____________________________________________   LABS (all labs ordered are listed, but only abnormal results are displayed)  Labs Reviewed  APTT - Abnormal; Notable for the following:       Result Value   aPTT <24 (*)    All other components within normal limits  CBC - Abnormal; Notable for the following:    RDW 15.2 (*)    All other components within normal limits  COMPREHENSIVE METABOLIC PANEL - Abnormal; Notable for the following:    Glucose, Bld 152 (*)    ALT 12 (*)    GFR calc non Af Amer 60 (*)    All other components within normal limits  GLUCOSE, CAPILLARY - Abnormal; Notable for the following:    Glucose-Capillary 130 (*)    All other components within normal limits  PROTIME-INR  DIFFERENTIAL  TROPONIN I  CBG MONITORING, ED   ____________________________________________  EKG  EKG shows sinus bradycardia rate of 59 normal axis essentially normal EKG but with increased R wave progression ____________________________________________  RADIOLOGY  CLINICAL DATA:  Acute onset of left shoulder pain after fall. Initial encounter.  EXAM: CHEST  2 VIEW  COMPARISON:  None.  FINDINGS: There is a mildly displaced mildly comminuted fracture involving the distal left clavicle, with mild inferior displacement of the distal clavicular fragment. The left acromioclavicular joint is unremarkable in appearance.  Displaced fractures of the left second through sixth posterior ribs are again noted.  The lungs are  hyperexpanded, with flattening of the hemidiaphragms, compatible with COPD. There is no evidence of focal opacification, pleural effusion or pneumothorax.  Cardiomediastinal silhouette is within normal limits. No acute osseous abnormalities are seen.  IMPRESSION: 1. Mildly displaced mildly comminuted fracture involving the distal left clavicle, with mild inferior displacement of the distal clavicular fragment. 2. Displaced fractures of the left second through sixth posterior ribs again seen. 3. Findings of COPD.  Lungs remain grossly clear.   Electronically Signed   By: Roanna Raider M.D.   On: 08/10/2015 23:01  CLINICAL DATA:  Code stroke. Slurred speech, dizziness, nausea, and near syncopal episode, and weakness.  EXAM: CT HEAD WITHOUT CONTRAST  TECHNIQUE: Contiguous axial images were obtained from the base of the skull through the vertex without intravenous contrast.  COMPARISON:  08/10/2015  FINDINGS: There is no evidence of acute cortical infarct, intracranial hemorrhage, mass, midline shift, or extra-axial fluid collection. Moderate cerebral atrophy is unchanged. Periventricular white matter  hypodensities are unchanged and nonspecific but compatible with moderate chronic small vessel ischemic disease. A small chronic infarct is again seen involving the anterior left insula.  Prior bilateral cataract extraction is noted. The visualized paranasal sinuses and mastoid air cells are clear. Mild calcified atherosclerosis is noted at the skullbase. No acute osseous abnormality is seen.  ASPECTS [facility] Stroke Program Early CT Score, [email or web address])  - Ganglionic level infarction (caudate, lentiform nuclei, internal capsule, insula, M1-M3 cortex): 7  - Supraganglionic infarction (M4-M6 cortex): 3  Total score (0-10 with 10 being normal): 10  IMPRESSION: 1. No evidence of acute intracranial abnormality. 2. ASPECTS score 10. 3.  Moderate chronic small vessel ischemic disease and cerebral atrophy.  These results were called by telephone at the time of interpretation on 02/20/2016 at 5:15 pm to Dr. Dorothea Glassman , who verbally acknowledged these results.   Electronically Signed   By: Sebastian Ache M.D.   On: 02/20/2016 17:16 ____________________________________________   PROCEDURES  Procedure(s) performed: [facility] (patient cannot definitively diagnose stroke recommends admission to neuro checks seizure precautions MRI and evaluation for possible cardiac cause of Procedures  Critical Care performed:  ____________________________________________   INITIAL IMPRESSION / ASSESSMENT AND PLAN / ED COURSE  Pertinent labs & imaging results that were available during my care of the patient were reviewed by me and considered in my medical decision making (see chart for details).  Patient remained stable in the ER I do not have urine back yet  Clinical Course     ____________________________________________   FINAL CLINICAL IMPRESSION(S) / ED DIAGNOSES  Final diagnoses:  Slurred speech   Diagnosis also include near syncope and relative hypotension   NEW MEDICATIONS STARTED DURING THIS VISIT:  New Prescriptions   No medications on file     Note:  This document was prepared using Dragon voice recognition software and may include unintentional dictation errors.    Arnaldo Natal, MD 02/20/16 [phone]

## 2016-02-20 NOTE — ED Triage Notes (Signed)
Patient presents to the ED with dizziness, nausea, and near syncopal episode about 30 minutes ago with slurred speech since that time.  Patient appears pale.  Smile appears somewhat uneven but grip strength is even.

## 2016-02-20 NOTE — H&P (Signed)
[facility] [place] [facility]   PATIENT NAME: Kelly Wright    MR#:  [id number]  DATE OF BIRTH:  02-01-1933  DATE OF ADMISSION:  02/20/2016  PRIMARY CARE PHYSICIAN: Leanna Sato, MD   REQUESTING/REFERRING PHYSICIAN: Darnelle Catalan, MD  CHIEF COMPLAINT:   Chief Complaint  Patient presents with  . Code Stroke    HISTORY OF PRESENT ILLNESS:  Kelly Wright  is a 82 y.o. female who presents with Episode of confusion and slurred speech. Patient has a prior history of stroke and family was concerned that she may been having another one. Patient herself is unable to provide much in the way of history, she states she does not remember the episode very well.  Initial workup in the ED was largely negative or within normal limits. Telemetry neurology was consulted and recommended admission for stroke/TIA workup. Hospitalists were called for the same  PAST MEDICAL HISTORY:   Past Medical History:  Diagnosis Date  . Arthritis   . Atrial fibrillation (HCC)   . Cerebral vascular accident (HCC)   . DVT (deep venous thrombosis) (HCC)   . Dysrhythmia     PAST SURGICAL HISTORY:   Past Surgical History:  Procedure Laterality Date  . ABDOMINAL HYSTERECTOMY      SOCIAL HISTORY:   Social History  Substance Use Topics  . Smoking status: Never Smoker  . Smokeless tobacco: Not on file  . Alcohol use No    FAMILY HISTORY:   Family History  Problem Relation Age of Onset  . Hodgkin's lymphoma Mother   . Cancer - Lung Father   . CVA Other     DRUG ALLERGIES:  No Known Allergies  MEDICATIONS AT HOME:   Prior to Admission medications   Medication Sig Start Date End Date Taking? Authorizing Provider  citalopram (CELEXA) 20 MG tablet Take 30 mg by mouth at bedtime.    Yes Historical Provider, MD  donepezil (ARICEPT) 10 MG tablet Take 10 mg by mouth at bedtime.   Yes Historical Provider, MD  folic acid (FOLVITE) 400 MCG tablet Take 400 mcg by mouth at bedtime.   Yes  Historical Provider, MD  memantine (NAMENDA) 10 MG tablet Take 10 mg by mouth 2 (two) times daily.   Yes Historical Provider, MD  methotrexate (RHEUMATREX) 2.5 MG tablet Take 12.5 mg by mouth every Wednesday.    Yes Historical Provider, MD    REVIEW OF SYSTEMS:  Review of Systems  Constitutional: Negative for chills, fever, malaise/fatigue and weight loss.  HENT: Negative for ear pain, hearing loss and tinnitus.   Eyes: Negative for blurred vision, double vision, pain and redness.  Respiratory: Negative for cough, hemoptysis and shortness of breath.   Cardiovascular: Negative for chest pain, palpitations, orthopnea and leg swelling.  Gastrointestinal: Negative for abdominal pain, constipation, diarrhea, nausea and vomiting.  Genitourinary: Negative for dysuria, frequency and hematuria.  Musculoskeletal: Negative for back pain, joint pain and neck pain.  Skin:       No acne, rash, or lesions  Neurological: Positive for speech change. Negative for dizziness, tremors, focal weakness and weakness.       Confusion  Endo/Heme/Allergies: Negative for polydipsia. Does not bruise/bleed easily.  Psychiatric/Behavioral: Negative for depression. The patient is not nervous/anxious and does not have insomnia.      VITAL SIGNS:   Vitals:   02/20/16 1900 02/20/16 1930 02/20/16 2000 02/20/16 2030  BP: 137/65 140/61 133/69 (!) 141/65  Pulse: (!) 58 60 65 85  Resp:  16 16 18 13   Temp:      TempSrc:      SpO2: 100% 100% 100% 90%   Wt Readings from Last 3 Encounters:  08/12/15 56.7 kg (125 lb 1.6 oz)  11/16/14 58.3 kg (128 lb 8 oz)    PHYSICAL EXAMINATION:  Physical Exam  Vitals reviewed. Constitutional: She is oriented to person, place, and time. She appears well-developed and well-nourished. No distress.  HENT:  Head: Normocephalic and atraumatic.  Mouth/Throat: Oropharynx is clear and moist.  Eyes: Conjunctivae and EOM are normal. Pupils are equal, round, and reactive to light. No scleral  icterus.  Neck: Normal range of motion. Neck supple. No JVD present. No thyromegaly present.  Cardiovascular: Normal rate, regular rhythm and intact distal pulses.  Exam reveals no gallop and no friction rub.   No murmur heard. Respiratory: Effort normal and breath sounds normal. No respiratory distress. She has no wheezes. She has no rales.  GI: Soft. Bowel sounds are normal. She exhibits no distension. There is no tenderness.  Musculoskeletal: Normal range of motion. She exhibits no edema.  No arthritis, no gout  Lymphadenopathy:    She has no cervical adenopathy.  Neurological: She is alert and oriented to person, place, and time. No cranial nerve deficit.  Neurologic: Cranial nerves II-XII intact, Sensation intact to light touch/pinprick, 5/5 strength in all extremities, no dysarthria, no aphasia, no dysphagia, no pronator drift, DTR intact.   Skin: Skin is warm and dry. No rash noted. No erythema.  Psychiatric: She has a normal mood and affect. Her behavior is normal. Judgment and thought content normal.    LABORATORY PANEL:   CBC  Recent Labs Lab 02/20/16 1707  WBC 5.4  HGB 13.5  HCT 39.4  PLT 151   ------------------------------------------------------------------------------------------------------------------  Chemistries   Recent Labs Lab 02/20/16 1707  NA 141  K 3.5  CL 104  CO2 29  GLUCOSE 152*  BUN 17  CREATININE 0.89  CALCIUM 9.3  AST 21  ALT 12*  ALKPHOS 47  BILITOT 0.8   ------------------------------------------------------------------------------------------------------------------  Cardiac Enzymes  Recent Labs Lab 02/20/16 1707  TROPONINI <0.03   ------------------------------------------------------------------------------------------------------------------  RADIOLOGY:  Ct Head Code Stroke W/o Cm  Result Date: 02/20/2016 CLINICAL DATA:  Code stroke. Slurred speech, dizziness, nausea, and near syncopal episode, and weakness. EXAM: CT  HEAD WITHOUT CONTRAST TECHNIQUE: Contiguous axial images were obtained from the base of the skull through the vertex without intravenous contrast. COMPARISON:  08/10/2015 FINDINGS: There is no evidence of acute cortical infarct, intracranial hemorrhage, mass, midline shift, or extra-axial fluid collection. Moderate cerebral atrophy is unchanged. Periventricular white matter hypodensities are unchanged and nonspecific but compatible with moderate chronic small vessel ischemic disease. A small chronic infarct is again seen involving the anterior left insula. Prior bilateral cataract extraction is noted. The visualized paranasal sinuses and mastoid air cells are clear. Mild calcified atherosclerosis is noted at the skullbase. No acute osseous abnormality is seen. ASPECTS [facility] Stroke Program Early CT Score, [email or web address]) - Ganglionic level infarction (caudate, lentiform nuclei, internal capsule, insula, M1-M3 cortex): 7 - Supraganglionic infarction (M4-M6 cortex): 3 Total score (0-10 with 10 being normal): 10 IMPRESSION: 1. No evidence of acute intracranial abnormality. 2. ASPECTS score 10. 3. Moderate chronic small vessel ischemic disease and cerebral atrophy. These results were called by telephone at the time of interpretation on 02/20/2016 at 5:15 pm to Dr. 04/21/2016 , who verbally acknowledged these results. Electronically Signed   By: Dorothea Glassman M.D.   On:  02/20/2016 17:16    EKG:   Orders placed or performed during the hospital encounter of 02/20/16  . ED EKG  . ED EKG  . EKG 12-Lead  . EKG 12-Lead    IMPRESSION AND PLAN:  Principal Problem:   Slurred speech - the patient's prior history of stroke, there is significant concern for new stroke/TIA. Patient states she does not feel that her speech is back to baseline, though there is no significantly detectable slurred speech on this writer's exam. We've admitted her per stroke/TIA orders have protocol with imaging and labs as  per the same and a neurology consult Active Problems:   Atrial fibrillation (HCC) - currently in sinus rhythm, though she has a history of the same. She is not on anticoagulation.   Rheumatoid arthritis (HCC) - continue home meds   Depression - continue home meds   H/O: CVA (cerebrovascular accident) - does not seem to be an antiplatelets, though this history certainly increases her risk for potential recurrent stroke. Workup as above  All the records are reviewed and case discussed with ED provider. Management plans discussed with the patient and/or family.  DVT PROPHYLAXIS: SubQ lovenox  GI PROPHYLAXIS: None  ADMISSION STATUS: Observation  CODE STATUS: full Code Status History    Date Active Date Inactive Code Status Order ID Comments User Context   08/11/2015  1:14 AM 08/12/2015  9:25 PM Full Code [id number]  Arnaldo Natal, MD ED   11/12/2014  2:35 AM 11/16/2014  9:54 PM Full Code [id number]  Vernell Barrier, MD Inpatient    Advance Directive Documentation   Flowsheet Row Most Recent Value  Type of Advance Directive  Healthcare Power of Attorney  Pre-existing out of facility DNR order (yellow form or pink MOST form)  No data  "MOST" Form in Place?  No data      TOTAL TIME TAKING CARE OF THIS PATIENT: 40 minutes.    Adaiah Morken FIELDING 02/20/2016, 8:34 PM  [facility]  Office  [phone]  CC: Primary care physician; Leanna Sato, MD

## 2016-02-20 NOTE — ED Notes (Signed)
Admitting MD at the bedside.  

## 2016-02-20 NOTE — ED Notes (Signed)
Pt states no dizziness, nausea, or vomiting. Pt just weak. Hx of falls. Pt has 24 hours sitter at home. Pt takes meds whole. Pt has trouble walking unsteady gait and shuffling. Pt needs to use walker but sometime she cant coordinated walk doesn't know how and sometimes she just want try. Will continue to monitor. No distress noted.

## 2016-02-21 ENCOUNTER — Observation Stay (HOSPITAL_BASED_OUTPATIENT_CLINIC_OR_DEPARTMENT_OTHER): Payer: Medicare Other

## 2016-02-21 ENCOUNTER — Encounter: Payer: Self-pay | Admitting: Student

## 2016-02-21 ENCOUNTER — Observation Stay: Payer: Medicare Other

## 2016-02-21 ENCOUNTER — Observation Stay (HOSPITAL_BASED_OUTPATIENT_CLINIC_OR_DEPARTMENT_OTHER)
Admit: 2016-02-21 | Discharge: 2016-02-21 | Disposition: A | Discharge status: Home / Self Care | Payer: Medicare Other | Attending: Internal Medicine | Admitting: Internal Medicine

## 2016-02-21 DIAGNOSIS — N390 Urinary tract infection, site not specified: Secondary | ICD-10-CM

## 2016-02-21 DIAGNOSIS — R4781 Slurred speech: Secondary | ICD-10-CM | POA: Diagnosis not present

## 2016-02-21 DIAGNOSIS — G459 Transient cerebral ischemic attack, unspecified: Secondary | ICD-10-CM

## 2016-02-21 DIAGNOSIS — F0390 Unspecified dementia without behavioral disturbance: Secondary | ICD-10-CM

## 2016-02-21 DIAGNOSIS — R531 Weakness: Secondary | ICD-10-CM

## 2016-02-21 LAB — HEMOGLOBIN A1C: HEMOGLOBIN A1C: 5.4 % (ref 4.0–6.0)

## 2016-02-21 LAB — LIPID PANEL
CHOL/HDL RATIO: 2.5 ratio
Cholesterol: 173 mg/dL (ref 0–200)
HDL: 69 mg/dL (ref >40)
LDL CALC: 93 mg/dL (ref 0–99)
Triglycerides: 57 mg/dL (ref <150)
VLDL: 11 mg/dL (ref 0–40)

## 2016-02-21 LAB — ECHOCARDIOGRAM COMPLETE
HEIGHTINCHES: 69 in
WEIGHTICAEL: 2078.4 [oz_av]

## 2016-02-21 MED ORDER — PNEUMOCOCCAL VAC POLYVALENT 25 MCG/0.5ML IJ INJ
0.5000 mL | INJECTION | INTRAMUSCULAR | Status: AC
  Administered 2016-02-22: 0.5 mL via INTRAMUSCULAR
  Filled 2016-02-21: qty 0.5

## 2016-02-21 MED ORDER — ACETAMINOPHEN 325 MG PO TABS: 650.0000 mg | ORAL_TABLET | Freq: Four times a day (QID) | ORAL | Status: DC | PRN

## 2016-02-21 MED ORDER — CEPHALEXIN 500 MG PO CAPS: 500.0000 mg | ORAL_CAPSULE | Freq: Two times a day (BID) | ORAL | 0 refills | Status: DC

## 2016-02-21 MED ORDER — CEPHALEXIN 500 MG PO CAPS
500.0000 mg | ORAL_CAPSULE | Freq: Two times a day (BID) | ORAL | Status: DC
  Administered 2016-02-21 – 2016-02-22 (×3): 500 mg via ORAL
  Filled 2016-02-21 (×4): qty 1

## 2016-02-21 MED ORDER — ASPIRIN EC 81 MG PO TBEC: 81.0000 mg | DELAYED_RELEASE_TABLET | Freq: Every day | ORAL | 0 refills | Status: DC

## 2016-02-21 NOTE — Care Management (Signed)
Placed in observation for sx concerning for cva.  Thus far has ruled out.  Neurology is consulting.   Patient lives at home she says with her husband "but he is not much help."  CSW spoke with patient's daughter and there is a hired caregiver in the home round the clock. Patient says she has a walker.  Physical therapy has recommended skilled nursing placement.  CSW explained that medicare would not pay for skilled nursing under observation.  Agreeable to home health and agency preference is advanced

## 2016-02-21 NOTE — Progress Notes (Signed)
SLP Note  Patient Details Name: Kelly Wright MRN: [id number] DOB: [birth year]   SLP NOTE: Received evaluation order for 82 y/o pt whom presented to ED with confusion and slurred speech. At time of admission pt had c/o slurred speech, however at this time she reports that she has returned to baseline. Pt was sitting up in chair and eating breakfast upon ST's arrival. No swallowing difficulties were observed while she consumed regular consistencies with thin liquids. Pt able to converse with ST and no dysarthria or word finding deficits were noted. Pt and family deny any swallowing problems or speech changes at this time. No ST indicated at this time. Spoke with nsg and pt. Pt to follow up with nursing and/or PCP if any further concerns arise.          Sardis,Maaran 02/21/2016, 9:51 AM

## 2016-02-21 NOTE — Evaluation (Signed)
Occupational Therapy Evaluation Patient Details Name: Kelly Wright MRN: [id number] DOB: 03-15-1933 Today's Date: 02/21/2016    History of Present Illness Pt. is an 82 y.o. female who was admitted to [facility] with Slurred speech   Clinical Impression    Pt. is an 82 y.o. Female who was admitted to [facility] with slurred speech. Pt. presents with a history of Dementia, weakness, impaired balance, and decreased functional mobility which hinders her ability to complete ADL and IADL tasks. Pt. could benefit from skilled OT services for ADL training, UE therapeutic ex., functional mobility during ADLs, and pt./caregiver education about DME, energy conservation, and home modifications. Pt. Has 24 hour caregivers to assist with ADLs.   Follow Up Recommendations  Home health OT (Pt. has 24 hour caregiver Assist)    Equipment Recommendations       Recommendations for Other Services PT consult     Precautions / Restrictions           Balance Overall balance assessment: Needs assistance           Standing balance-Leahy Scale: Fair                              ADL Overall ADL's : Needs assistance/impaired Eating/Feeding: Set up   Grooming: Set up               Lower Body Dressing: Minimal assistance               Functional mobility during ADLs: Minimal assistance;Cueing for safety       Vision     Perception     Praxis      Pertinent Vitals/Pain Pain Assessment: 0-10 Pain Score: 0-No pain     Hand Dominance     Extremity/Trunk Assessment Upper Extremity Assessment Upper Extremity Assessment: Overall WFL for tasks assessed           Communication     Cognition Arousal/Alertness: Awake/alert Behavior During Therapy: WFL for tasks assessed/performed Overall Cognitive Status: History of cognitive impairments - at baseline                     General Comments       Exercises       Shoulder Instructions      Home Living  Family/patient expects to be discharged to:: Private residence Living Arrangements: Alone Available Help at Discharge: Available 24 hours/day Type of Home: House Home Access: Stairs to enter [employer] of Steps: 1   Home Layout: One level     Bathroom Shower/Tub: Walk-in shower;Door   [employer] Toilet: Handicapped height     Home Equipment: Toilet riser;Shower seat          Prior Functioning/Environment Level of Independence: Needs assistance    ADL's / Homemaking Assistance Needed: 24 hour Caregiver Assist        OT Diagnosis: Generalized weakness   OT Problem List: Decreased strength;Impaired balance (sitting and/or standing);Decreased knowledge of use of DME or AE;Decreased activity tolerance;Decreased safety awareness   OT Treatment/Interventions: Self-care/ADL training;Therapeutic exercise;Neuromuscular education;DME and/or AE instruction;Energy conservation;Patient/family education;Therapeutic activities;Balance training    OT Goals(Current goals can be found in the care plan section) Acute Rehab OT Goals Patient Stated Goal: To go home. OT Goal Formulation: With patient/family Potential to Achieve Goals: Good  OT Frequency: Min 1X/week   Barriers to D/C:            Co-evaluation  End of Session Equipment Utilized During Treatment: Gait belt  Activity Tolerance: Patient tolerated treatment well Patient left: in chair   Time: [phone] OT Time Calculation (min): 15 min Charges:  OT General Charges $OT Visit: 1 Procedure OT Evaluation $OT Eval Moderate Complexity: 1 Procedure G-Codes: OT G-codes **NOT FOR INPATIENT CLASS** Functional Assessment Tool Used: Clinical judgement based on pt. current functional status Functional Limitation: Self care Self Care Current Status (G9924): At least 20 percent but less than 40 percent impaired, limited or restricted Self Care Goal Status (Q6834): At least 1 percent but less than 20  percent impaired, limited or restricted  Olegario Messier 02/21/2016, 10:01 AM

## 2016-02-21 NOTE — Evaluation (Signed)
Physical Therapy Evaluation Patient Details Name: Kelly Wright MRN: [id number] DOB: 19-Aug-1932 Today's Date: 02/21/2016   History of Present Illness  Pt. is an 82 y.o. female who was admitted to [facility] with Slurred speech, no acute infarct of imaging  Clinical Impression  Pt is able to ambulate with poor confidence and generally needs close supervision and cuing for safety and awareness.  She is able to do limited mobility and transfers but again is slow, guarded and generally needs close supervision for safety.  Pt is unable to answer some questions and overall is unable to report how her current activity level compares to her baseline.     Follow Up Recommendations SNF;Home health PT (per progress)    Equipment Recommendations       Recommendations for Other Services       Precautions / Restrictions Precautions Precautions: Fall Restrictions Weight Bearing Restrictions: No      Mobility  Bed Mobility Overal bed mobility: Modified Independent Bed Mobility: Supine to Sit     Supine to sit: Min assist     General bed mobility comments: Pt is slow and needs extra cuing and assist with the effort and though she is able to get to sitting needs assist to scoot to upright position  Transfers Overall transfer level: Needs assistance Equipment used: Rolling walker (2 wheeled) Transfers: Sit to/from Stand Sit to Stand: Min assist         General transfer comment: Pt is able to get to standing with FWW and light assist to keep weight forward and to maintain balance   Ambulation/Gait Ambulation/Gait assistance: Min assist Ambulation Distance (Feet): 60 Feet Assistive device: Rolling walker (2 wheeled);1 person hand held assist       General Gait Details: Pt is able to ambulate with very slow, cautious ambulation.  She is unable to report if this is her baseline or not, but it is guarded and not safe.    Stairs            Wheelchair Mobility    Modified Rankin  (Stroke Patients Only)       Balance Overall balance assessment: Needs assistance           Standing balance-Leahy Scale: Fair                               Pertinent Vitals/Pain Pain Assessment: No/denies pain Pain Score: 0-No pain    Home Living Family/patient expects to be discharged to:: Private residence Living Arrangements: Alone Available Help at Discharge: Available 24 hours/day;Personal care attendant Type of Home: House Home Access: Stairs to enter   [employer] of Steps: 1 Home Layout: One level Home Equipment: Toilet riser;Shower seat      Prior Function Level of Independence: Needs assistance      ADL's / Homemaking Assistance Needed: 24 hour Caregiver Assist        Hand Dominance        Extremity/Trunk Assessment   Upper Extremity Assessment: Overall WFL for tasks assessed           Lower Extremity Assessment: Generalized weakness         Communication   Communication: No difficulties  Cognition Arousal/Alertness: Awake/alert Behavior During Therapy: WFL for tasks assessed/performed Overall Cognitive Status: History of cognitive impairments - at baseline                      General  Comments      Exercises        Assessment/Plan    PT Assessment Patient needs continued PT services  PT Diagnosis Generalized weakness;Difficulty walking   PT Problem List Pain;Decreased balance;Decreased activity tolerance;Decreased strength;Decreased range of motion;Decreased mobility;Decreased safety awareness  PT Treatment Interventions Gait training;Therapeutic exercise;Balance training;Therapeutic activities;Functional mobility training;Stair training;DME instruction;Neuromuscular re-education   PT Goals (Current goals can be found in the Care Plan section) Acute Rehab PT Goals Patient Stated Goal: To go home. PT Goal Formulation: Patient unable to participate in goal setting Time For Goal Achievement:  03/06/16 Potential to Achieve Goals: Fair    Frequency Min 2X/week   Barriers to discharge        Co-evaluation               End of Session Equipment Utilized During Treatment: Gait belt Activity Tolerance: Patient limited by fatigue Patient left: with chair alarm set;with call bell/phone within reach      Functional Assessment Tool Used: clinical judgement Functional Limitation: Mobility: Walking and moving around Mobility: Walking and Moving Around Current Status (X3235): At least 20 percent but less than 40 percent impaired, limited or restricted Mobility: Walking and Moving Around Goal Status [phone]): At least 1 percent but less than 20 percent impaired, limited or restricted    Time: 0816-0844 PT Time Calculation (min) (ACUTE ONLY): 28 min   Charges:   PT Evaluation $PT Eval Low Complexity: 1 Procedure     PT G Codes:   PT G-Codes **NOT FOR INPATIENT CLASS** Functional Assessment Tool Used: clinical judgement Functional Limitation: Mobility: Walking and moving around Mobility: Walking and Moving Around Current Status (G2542): At least 20 percent but less than 40 percent impaired, limited or restricted Mobility: Walking and Moving Around Goal Status [phone]): At least 1 percent but less than 20 percent impaired, limited or restricted    Malachi Pro, DPT 02/21/2016, 10:58 AM

## 2016-02-21 NOTE — Consult Note (Addendum)
Referring Physician: Winona Legato    Chief Complaint: Slurred speech  HPI: Kelly Wright is an 82 y.o. female who has a history of stroke and dementia who is amnestic of the event yesterday.  Caregiver provides history. She reports that they were shopping yesterday and the patient became acutely ill.  Slumped over at the register, vomited and developed a few seconds worth of dizziness.  Patient was incontinent twice during the event.  Is at baseline today.   Recently started on Namenda and Aricept. At baseline ambulates with a walker.    Date last known well: 02/20/2016 Time last known well: Time: 16:30 tPA Given: No: Resolution of symptoms  Past Medical History:  Diagnosis Date  . Arthritis   . Atrial fibrillation (HCC)   . Cerebral vascular accident (HCC)   . DVT (deep venous thrombosis) (HCC)   . Dysrhythmia     Past Surgical History:  Procedure Laterality Date  . ABDOMINAL HYSTERECTOMY      Family History  Problem Relation Age of Onset  . Hodgkin's lymphoma Mother   . Cancer - Lung Father   . CVA Other    Social History:  reports that she has never smoked. She has never used smokeless tobacco. She reports that she does not drink alcohol or use drugs.  Allergies: No Known Allergies  Medications:  I have reviewed the patient's current medications. Prior to Admission:  Prescriptions Prior to Admission  Medication Sig Dispense Refill Last Dose  . citalopram (CELEXA) 20 MG tablet Take 30 mg by mouth at bedtime.    02/19/2016 at pm  . donepezil (ARICEPT) 10 MG tablet Take 10 mg by mouth at bedtime.   02/19/2016 at pm  . folic acid (FOLVITE) 400 MCG tablet Take 400 mcg by mouth at bedtime.   02/19/2016 at pm  . memantine (NAMENDA) 10 MG tablet Take 10 mg by mouth 2 (two) times daily.   02/20/2016 at am  . methotrexate (RHEUMATREX) 2.5 MG tablet Take 12.5 mg by mouth every Wednesday.    02/19/2016 at am   Scheduled: . cephALEXin  500 mg Oral Q12H  . enoxaparin (LOVENOX) injection  40  mg Subcutaneous Q24H  . [START ON 02/22/2016] pneumococcal 23 valent vaccine  0.5 mL Intramuscular Tomorrow-1000    ROS: History obtained from the patient  General ROS: weight loss, decreased appetite Psychological ROS: memory difficulties Ophthalmic ROS: negative for - blurry vision, double vision, eye pain or loss of vision ENT ROS: negative for - epistaxis, nasal discharge, oral lesions, sore throat, tinnitus or vertigo Allergy and Immunology ROS: negative for - hives or itchy/watery eyes Hematological and Lymphatic ROS: negative for - bleeding problems, bruising or swollen lymph nodes Endocrine ROS: negative for - galactorrhea, hair pattern changes, polydipsia/polyuria or temperature intolerance Respiratory ROS: negative for - cough, hemoptysis, shortness of breath or wheezing Cardiovascular ROS: negative for - chest pain, dyspnea on exertion, edema or irregular heartbeat Gastrointestinal ROS: negative for - abdominal pain, diarrhea, hematemesis, nausea/vomiting or stool incontinence Genito-Urinary ROS: negative for - dysuria, hematuria, incontinence or urinary frequency/urgency Musculoskeletal ROS: negative for - joint swelling or muscular weakness Neurological ROS: as noted in HPI Dermatological ROS: negative for rash and skin lesion changes  Physical Examination: Blood pressure (!) 117/51, pulse 60, temperature 98.3 F (36.8 C), temperature source Oral, resp. rate 15, height 5\' 9"  (1.753 m), weight 58.9 kg (129 lb 14.4 oz), SpO2 100 %.  HEENT-  Normocephalic, no lesions, without obvious abnormality.  Normal external eye and  conjunctiva.  Normal TM's bilaterally.  Normal auditory canals and external ears. Normal external nose, mucus membranes and septum.  Normal pharynx. Cardiovascular- S1, S2 normal, pulses palpable throughout   Lungs- chest clear, no wheezing, rales, normal symmetric air entry Abdomen- soft, non-tender; bowel sounds normal; no masses,  no  organomegaly Extremities- no edema Lymph-no adenopathy palpable Musculoskeletal-no joint tenderness, deformity or swelling Skin-warm and dry, no hyperpigmentation, vitiligo, or suspicious lesions  Neurological Examination Mental Status: Alert, oriented, thought content appropriate.  Speech fluent without evidence of aphasia.  Able to follow 3 step commands without difficulty. Cranial Nerves: II: Discs flat bilaterally; Visual fields grossly normal, pupils equal, round, reactive to light and accommodation III,IV, VI: ptosis not present, extra-ocular motions intact bilaterally V,VII: smile symmetric, facial light touch sensation normal bilaterally VIII: hearing normal bilaterally IX,X: gag reflex present XI: bilateral shoulder shrug XII: midline tongue extension Motor: Right : Upper extremity   5/5    Left:     Upper extremity   5/5  Lower extremity   5/5     Lower extremity   5/5 Tone and bulk:normal tone throughout; no atrophy noted Sensory: Pinprick and light touch intact throughout, bilaterally Deep Tendon Reflexes: 2+ and symmetric with absent AJ's bilaterally Plantars: Right: mute   Left: downgoing Cerebellar: Normal finger-to-nose and normal heel-to-shin testing bilaterally Gait: not tested due to safety concerns    Laboratory Studies:  Basic Metabolic Panel:  Recent Labs Lab 02/20/16 1707  NA 141  K 3.5  CL 104  CO2 29  GLUCOSE 152*  BUN 17  CREATININE 0.89  CALCIUM 9.3    Liver Function Tests:  Recent Labs Lab 02/20/16 1707  AST 21  ALT 12*  ALKPHOS 47  BILITOT 0.8  PROT 6.7  ALBUMIN 4.0   No results for input(s): LIPASE, AMYLASE in the last 168 hours. No results for input(s): AMMONIA in the last 168 hours.  CBC:  Recent Labs Lab 02/20/16 1707  WBC 5.4  NEUTROABS 3.8  HGB 13.5  HCT 39.4  MCV 99.0  PLT 151    Cardiac Enzymes:  Recent Labs Lab 02/20/16 1707  TROPONINI <0.03    BNP: Invalid input(s): POCBNP  CBG:  Recent  Labs Lab 02/20/16 1709  GLUCAP 130*    Microbiology: Results for orders placed or performed during the hospital encounter of 08/10/15  Urine culture     Status: None   Collection Time: 08/10/15 10:34 PM  Result Value Ref Range Status   Specimen Description URINE, RANDOM  Final   Special Requests NONE  Final   Culture NO GROWTH 1 DAY  Final   Report Status 08/12/2015 FINAL  Final    Coagulation Studies:  Recent Labs  02/20/16 1707  LABPROT 14.0  INR 1.08    Urinalysis:  Recent Labs Lab 02/20/16 2033  COLORURINE YELLOW*  LABSPEC 1.023  PHURINE 5.0  GLUCOSEU 50*  HGBUR NEGATIVE  BILIRUBINUR NEGATIVE  KETONESUR NEGATIVE  PROTEINUR 30*  NITRITE NEGATIVE  LEUKOCYTESUR NEGATIVE    Lipid Panel:    Component Value Date/Time   CHOL 173 02/21/2016 0630   CHOL 163 11/19/2011 2347   TRIG 57 02/21/2016 0630   TRIG 64 11/19/2011 2347   HDL 69 02/21/2016 0630   HDL 79 (H) 11/19/2011 2347   CHOLHDL 2.5 02/21/2016 0630   VLDL 11 02/21/2016 0630   VLDL 13 11/19/2011 2347   LDLCALC 93 02/21/2016 0630   LDLCALC 71 11/19/2011 2347    HgbA1C: No results found for:  HGBA1C  Urine Drug Screen:  No results found for: LABOPIA, COCAINSCRNUR, LABBENZ, AMPHETMU, THCU, LABBARB  Alcohol Level: No results for input(s): ETH in the last 168 hours.  Other results: EKG: sinus rhythm at 59 bpm.  Imaging: Dg Chest 2 View  Result Date: 02/20/2016 CLINICAL DATA:  Slurred speech, weakness, dizziness, nausea vomiting. EXAM: CHEST  2 VIEW COMPARISON:  08/10/2015 FINDINGS: Cardiomediastinal silhouette is normal. Mediastinal contours appear intact. The lungs are hyperinflated.  No focal airspace consolidations seen. Osseous structures are without acute abnormality. Healed left-sided rib fractures. Soft tissues are grossly normal. IMPRESSION: No active cardiopulmonary disease. COPD. Electronically Signed   By: Ted Mcalpine M.D.   On: 02/20/2016 22:30   Mr Brain Wo Contrast  Result  Date: 02/21/2016 CLINICAL DATA:  Code stroke yesterday. Slurred speech, dizziness, nausea, and near syncopal episode. Weakness. EXAM: MRI HEAD WITHOUT CONTRAST MRA HEAD WITHOUT CONTRAST TECHNIQUE: Multiplanar, multiecho pulse sequences of the brain and surrounding structures were obtained without intravenous contrast. Angiographic images of the head were obtained using MRA technique without contrast. COMPARISON:  CT head without contrast 02/20/2016. FINDINGS: MRI HEAD FINDINGS The diffusion-weighted images demonstrate no evidence for acute or subacute infarction. No acute hemorrhage or mass lesion is present. Moderate atrophy and periventricular white matter changes are similar to the prior exam. The ventricles are proportionate to the degree of atrophy. No significant extra-axial fluid collection is present. Dilated perivascular spaces are noted within the basal ganglia bilaterally. The internal auditory canals are within normal limits bilaterally. The brainstem and cerebellum are normal. Flow is present in the major intracranial arteries. Bilateral lens replacements are present. The paranasal sinuses are clear. There is some fluid in the mastoid air cells bilaterally. No obstructing nasopharyngeal lesion is present. MRA HEAD FINDINGS The study is mildly degraded by patient motion. This obscures signal. Atherosclerotic changes within the cavernous internal carotid arteries bilaterally is exaggerated by artifact. The ICA termini are intact. The A1 and M1 segments are normal. The anterior communicating artery is dominant on the right. Both A2 segments fill. The anterior communicating artery is patent. There is artifactual signal loss in the distal M1 segments bilaterally. MCA branch vessels are within normal limits bilaterally. The right vertebral artery is the dominant vessel. The vertebrobasilar junction is normal. PICA origins are not discretely seen. The basilar artery is within normal limits. Both posterior  cerebral arteries originate from basilar tip. There is artifactual signal loss in the proximal posterior cerebral arteries bilaterally. There is some attenuation of distal PCA branch vessels. IMPRESSION: 1. No acute or focal intracranial abnormality to explain slurred speech. 2. Moderate atrophy and white matter disease is similar the prior study, within normal limits for age. 3. MRA demonstrates mild distal small vessel disease without significant proximal stenosis, aneurysm, or branch vessel occlusion. 4. The MRA is somewhat limited by patient motion, altering signal within the proximal PCA branch vessels and distal M1 segments bilaterally. Electronically Signed   By: Marin Roberts M.D.   On: 02/21/2016 14:29   US Carotid Bilateral  Result Date: 02/21/2016 CLINICAL DATA:  Slurred speech. EXAM: BILATERAL CAROTID DUPLEX ULTRASOUND TECHNIQUE: Wallace Cullens scale imaging, color Doppler and duplex ultrasound were performed of bilateral carotid and vertebral arteries in the neck. COMPARISON:  Carotid Doppler ultrasound - 04/13/2014; 11/20/2011 FINDINGS: Criteria: Quantification of carotid stenosis is based on velocity parameters that correlate the residual internal carotid diameter with NASCET-based stenosis levels, using the diameter of the distal internal carotid lumen as the denominator for stenosis measurement. The  following velocity measurements were obtained: RIGHT ICA:  77/16 cm/sec CCA:  46/9 cm/sec SYSTOLIC ICA/CCA RATIO:  1.7 DIASTOLIC ICA/CCA RATIO:  1.8 ECA:  66 cm/sec LEFT ICA:  110/6 cm/sec CCA:  62/10 cm/sec SYSTOLIC ICA/CCA RATIO:  1.8 DIASTOLIC ICA/CCA RATIO:  2.2 ECA:  65 cm/sec RIGHT CAROTID ARTERY: There is no grayscale evidence of significant intimal thickening or atherosclerotic plaque affecting interrogated portions of the right carotid system. There are no elevated peak systolic velocities within the interrogated course the right internal carotid artery to suggest a hemodynamically significant  stenosis. RIGHT VERTEBRAL ARTERY:  Antegrade Flow LEFT CAROTID ARTERY: There is a minimal amount of atherosclerotic plaque within the left carotid bulb (images 38 and 39), morphologically similar to the 04/2014 examination and again not resulting in elevated peak systolic velocities within the interrogated course the left internal carotid artery to suggest a hemodynamically significant stenosis. LEFT VERTEBRAL ARTERY:  Antegrade Flow IMPRESSION: 1. Very minimal amount of left-sided atherosclerotic plaque, morphologically similar to the 04/2014 examination and again not resulting in a hemodynamically significant stenosis within the left internal carotid artery. 2. Normal sonographic evaluation of the right carotid system. Electronically Signed   By: Simonne Come M.D.   On: 02/21/2016 11:12   Mr Maxine Glenn Head/brain PY Cm  Result Date: 02/21/2016 CLINICAL DATA:  Code stroke yesterday. Slurred speech, dizziness, nausea, and near syncopal episode. Weakness. EXAM: MRI HEAD WITHOUT CONTRAST MRA HEAD WITHOUT CONTRAST TECHNIQUE: Multiplanar, multiecho pulse sequences of the brain and surrounding structures were obtained without intravenous contrast. Angiographic images of the head were obtained using MRA technique without contrast. COMPARISON:  CT head without contrast 02/20/2016. FINDINGS: MRI HEAD FINDINGS The diffusion-weighted images demonstrate no evidence for acute or subacute infarction. No acute hemorrhage or mass lesion is present. Moderate atrophy and periventricular white matter changes are similar to the prior exam. The ventricles are proportionate to the degree of atrophy. No significant extra-axial fluid collection is present. Dilated perivascular spaces are noted within the basal ganglia bilaterally. The internal auditory canals are within normal limits bilaterally. The brainstem and cerebellum are normal. Flow is present in the major intracranial arteries. Bilateral lens replacements are present. The paranasal  sinuses are clear. There is some fluid in the mastoid air cells bilaterally. No obstructing nasopharyngeal lesion is present. MRA HEAD FINDINGS The study is mildly degraded by patient motion. This obscures signal. Atherosclerotic changes within the cavernous internal carotid arteries bilaterally is exaggerated by artifact. The ICA termini are intact. The A1 and M1 segments are normal. The anterior communicating artery is dominant on the right. Both A2 segments fill. The anterior communicating artery is patent. There is artifactual signal loss in the distal M1 segments bilaterally. MCA branch vessels are within normal limits bilaterally. The right vertebral artery is the dominant vessel. The vertebrobasilar junction is normal. PICA origins are not discretely seen. The basilar artery is within normal limits. Both posterior cerebral arteries originate from basilar tip. There is artifactual signal loss in the proximal posterior cerebral arteries bilaterally. There is some attenuation of distal PCA branch vessels. IMPRESSION: 1. No acute or focal intracranial abnormality to explain slurred speech. 2. Moderate atrophy and white matter disease is similar the prior study, within normal limits for age. 3. MRA demonstrates mild distal small vessel disease without significant proximal stenosis, aneurysm, or branch vessel occlusion. 4. The MRA is somewhat limited by patient motion, altering signal within the proximal PCA branch vessels and distal M1 segments bilaterally. Electronically Signed   By: Cristal Deer  Mattern M.D.   On: 02/21/2016 14:29   Ct Head Code Stroke W/o Cm  Result Date: 02/20/2016 CLINICAL DATA:  Code stroke. Slurred speech, dizziness, nausea, and near syncopal episode, and weakness. EXAM: CT HEAD WITHOUT CONTRAST TECHNIQUE: Contiguous axial images were obtained from the base of the skull through the vertex without intravenous contrast. COMPARISON:  08/10/2015 FINDINGS: There is no evidence of acute  cortical infarct, intracranial hemorrhage, mass, midline shift, or extra-axial fluid collection. Moderate cerebral atrophy is unchanged. Periventricular white matter hypodensities are unchanged and nonspecific but compatible with moderate chronic small vessel ischemic disease. A small chronic infarct is again seen involving the anterior left insula. Prior bilateral cataract extraction is noted. The visualized paranasal sinuses and mastoid air cells are clear. Mild calcified atherosclerosis is noted at the skullbase. No acute osseous abnormality is seen. ASPECTS [facility] Stroke Program Early CT Score, [email or web address]) - Ganglionic level infarction (caudate, lentiform nuclei, internal capsule, insula, M1-M3 cortex): 7 - Supraganglionic infarction (M4-M6 cortex): 3 Total score (0-10 with 10 being normal): 10 IMPRESSION: 1. No evidence of acute intracranial abnormality. 2. ASPECTS score 10. 3. Moderate chronic small vessel ischemic disease and cerebral atrophy. These results were called by telephone at the time of interpretation on 02/20/2016 at 5:15 pm to Dr. Dorothea Glassman , who verbally acknowledged these results. Electronically Signed   By: Sebastian Ache M.D.   On: 02/20/2016 17:16    Assessment: 82 y.o. female presenting after an episode of vomiting, slurred speech and incontinence for which the patient is amnestic.  MRI of the brain personally reviewed and shows no acute changes.  Carotid dopplers show no evidence of hemodynamically significant stenosis.  Echocardiogram pending.  A1c 5.4, LDL 93.  With patient unable to recall event can not rule out seizure.  Also concerned about use of Namenda and Aricept.  Patient has had side effects in the past on monotherapy and appears to be having side effects on dual therapy.  Did not have problems going off therapy in the past.   Patient with a history of atrial fibrillation on no antiplatelet or anticoagulation.  Is a fall risk.    Stroke Risk Factors -  atrial fibrillation  Plan: 1. Echocardiogram pending 2. Prophylactic therapy-Antiplatelet med: Aspirin - dose 324mg  daily 3. NPO until RN stroke swallow screen 4. Telemetry monitoring 5. Frequent neuro checks 6. D/C Namenda and Aricept 7. EEG   , MD Neurology [phone] 02/21/2016, 2:54 PM

## 2016-02-21 NOTE — Progress Notes (Signed)
[facility]   PATIENT NAME: Kelly Wright    MR#:  [id number]  DATE OF BIRTH:  02/02/1933  SUBJECTIVE:  CHIEF COMPLAINT:   Chief Complaint  Patient presents with  . Code Stroke  Patient is a 82 year old Caucasian female who presented to the hospital with complaints of slurred speech and confusion, however, by the time she reported to emergency room her speech returned back to baseline. She was admitted to the hospital because of concerns of stroke. Labs revealed pyuria, concerning for urinary tract infection. She feels good today, denies any discomfort, pain, or speech problems. She was evaluated by speech therapist and no dysarthria was noted or swallowing issues. Patient was also seen by patient will therapist and home health services were recommended upon discharge. CT of head showed no acute abnormality, chest x-ray was normal, carotid ultrasound as well as MRI of pending.  Review of Systems  Constitutional: Negative for chills, fever and weight loss.  HENT: Negative for congestion.   Eyes: Negative for blurred vision and double vision.  Respiratory: Negative for cough, sputum production, shortness of breath and wheezing.   Cardiovascular: Negative for chest pain, palpitations, orthopnea, leg swelling and PND.  Gastrointestinal: Negative for abdominal pain, blood in stool, constipation, diarrhea, nausea and vomiting.  Genitourinary: Negative for dysuria, frequency, hematuria and urgency.  Musculoskeletal: Negative for falls.  Neurological: Negative for dizziness, tremors, focal weakness and headaches.  Endo/Heme/Allergies: Does not bruise/bleed easily.  Psychiatric/Behavioral: Negative for depression. The patient does not have insomnia.     VITAL SIGNS: Blood pressure 130/61, pulse 62, temperature 98.6 F (37 C), temperature source Oral, resp. rate 15, height 5\' 9"  (1.753 m), weight 58.9 kg (129 lb 14.4 oz), SpO2 100 %.  PHYSICAL  EXAMINATION:   GENERAL:  82 y.o.-year-old patient lying in the bed with no acute distress.  EYES: Pupils equal, round, reactive to light and accommodation. No scleral icterus. Extraocular muscles intact.  HEENT: Head atraumatic, normocephalic. Oropharynx and nasopharynx clear.  NECK:  Supple, no jugular venous distention. No thyroid enlargement, no tenderness.  LUNGS: Normal breath sounds bilaterally, no wheezing, rales,rhonchi or crepitation. No use of accessory muscles of respiration.  CARDIOVASCULAR: S1, S2 normal. No murmurs, rubs, or gallops.  ABDOMEN: Soft, nontender, nondistended. Bowel sounds present. No organomegaly or mass.  EXTREMITIES: No pedal edema, cyanosis, or clubbing.  NEUROLOGIC: Cranial nerves II through XII are intact. Muscle strength 5/5 in all extremities. Sensation intact. Gait not checked.  PSYCHIATRIC: The patient is alert and oriented x 3.  SKIN: No obvious rash, lesion, or ulcer.   ORDERS/RESULTS REVIEWED:   CBC  Recent Labs Lab 02/20/16 1707  WBC 5.4  HGB 13.5  HCT 39.4  PLT 151  MCV 99.0  MCH 33.8  MCHC 34.2  RDW 15.2*  LYMPHSABS 1.1  MONOABS 0.4  EOSABS 0.0  BASOSABS 0.0   ------------------------------------------------------------------------------------------------------------------  Chemistries   Recent Labs Lab 02/20/16 1707  NA 141  K 3.5  CL 104  CO2 29  GLUCOSE 152*  BUN 17  CREATININE 0.89  CALCIUM 9.3  AST 21  ALT 12*  ALKPHOS 47  BILITOT 0.8   ------------------------------------------------------------------------------------------------------------------ estimated creatinine clearance is 46.9 mL/min (by C-G formula based on SCr of 0.89 mg/dL). ------------------------------------------------------------------------------------------------------------------ No results for input(s): TSH, T4TOTAL, T3FREE, THYROIDAB in the last 72 hours.  Invalid input(s): FREET3  Cardiac Enzymes  Recent Labs Lab 02/20/16 1707   TROPONINI <0.03   ------------------------------------------------------------------------------------------------------------------ Invalid input(s): POCBNP ---------------------------------------------------------------------------------------------------------------  RADIOLOGY:  Dg Chest 2 View  Result Date: 02/20/2016 CLINICAL DATA:  Slurred speech, weakness, dizziness, nausea vomiting. EXAM: CHEST  2 VIEW COMPARISON:  08/10/2015 FINDINGS: Cardiomediastinal silhouette is normal. Mediastinal contours appear intact. The lungs are hyperinflated.  No focal airspace consolidations seen. Osseous structures are without acute abnormality. Healed left-sided rib fractures. Soft tissues are grossly normal. IMPRESSION: No active cardiopulmonary disease. COPD. Electronically Signed   By: Ted Mcalpine M.D.   On: 02/20/2016 22:30   Ct Head Code Stroke W/o Cm  Result Date: 02/20/2016 CLINICAL DATA:  Code stroke. Slurred speech, dizziness, nausea, and near syncopal episode, and weakness. EXAM: CT HEAD WITHOUT CONTRAST TECHNIQUE: Contiguous axial images were obtained from the base of the skull through the vertex without intravenous contrast. COMPARISON:  08/10/2015 FINDINGS: There is no evidence of acute cortical infarct, intracranial hemorrhage, mass, midline shift, or extra-axial fluid collection. Moderate cerebral atrophy is unchanged. Periventricular white matter hypodensities are unchanged and nonspecific but compatible with moderate chronic small vessel ischemic disease. A small chronic infarct is again seen involving the anterior left insula. Prior bilateral cataract extraction is noted. The visualized paranasal sinuses and mastoid air cells are clear. Mild calcified atherosclerosis is noted at the skullbase. No acute osseous abnormality is seen. ASPECTS [facility] Stroke Program Early CT Score, [email or web address]) - Ganglionic level infarction (caudate, lentiform nuclei, internal capsule,  insula, M1-M3 cortex): 7 - Supraganglionic infarction (M4-M6 cortex): 3 Total score (0-10 with 10 being normal): 10 IMPRESSION: 1. No evidence of acute intracranial abnormality. 2. ASPECTS score 10. 3. Moderate chronic small vessel ischemic disease and cerebral atrophy. These results were called by telephone at the time of interpretation on 02/20/2016 at 5:15 pm to Dr. Dorothea Glassman , who verbally acknowledged these results. Electronically Signed   By: Sebastian Ache M.D.   On: 02/20/2016 17:16    EKG:  Orders placed or performed during the hospital encounter of 02/20/16  . ED EKG  . ED EKG  . EKG 12-Lead  . EKG 12-Lead    ASSESSMENT AND PLAN:  Principal Problem:   Slurred speech Active Problems:   Rheumatoid arthritis (HCC)   Atrial fibrillation (HCC)   H/O: CVA (cerebrovascular accident)   Depression  #1. Slurred speech, resolved, could be related to an allergic infection, rule out stroke, MRI of the brain is pending, possible discharge home today with home health if no stroke is noted #2. Urinary tract infection, initiate patient on Keflex, urinary cultures are pending #3. History of atrial fibrillation, in sinus rhythm now, patient is not anticoagulated, she is to follow-up with her primary care physician and cardiologist as outpatient for consideration of anticoagulation, however, undergo ablation will be initiated if stroke is elicited on MRI #4. Dementia, supportive therapy #5. Generalized weakness, home health services will be arranged upon discharge Management plans discussed with the patient, family and they are in agreement.   DRUG ALLERGIES: No Known Allergies  CODE STATUS:     Code Status Orders        Start     Ordered   02/20/16 2142  Full code  Continuous     02/20/16 2141    Code Status History    Date Active Date Inactive Code Status Order ID Comments User Context   02/20/2016  9:41 PM 02/21/2016  8:42 AM Full Code [id number]  Oralia Manis, MD Inpatient    08/11/2015  1:14 AM 08/12/2015  9:25 PM Full Code [id number]  Arnaldo Natal, MD ED   11/12/2014  2:35 AM  11/16/2014  9:54 PM Full Code [id number]  Vernell Barrier, MD Inpatient    Advance Directive Documentation   Flowsheet Row Most Recent Value  Type of Advance Directive  Healthcare Power of Attorney  Pre-existing out of facility DNR order (yellow form or pink MOST form)  No data  "MOST" Form in Place?  No data      TOTAL TIME TAKING CARE OF THIS PATIENT: 40 minutes.  Discussed with patient's family  Crystalmarie Yasin M.D on 02/21/2016 at 10:41 AM  Between 7am to 6pm - Pager - [phone]  After 6pm go to [email or web address] - password [email or web address] [facility]  [facility]  Office  [phone]  CC: Primary care physician; Leanna Sato, MD

## 2016-02-21 NOTE — Clinical Social Work Note (Signed)
Clinical Social Work Assessment  Patient Details  Name: Kelly Wright MRN: [id number] Date of Birth: [birth year]  Date of referral:  02/21/16               Reason for consult:  Discharge Planning                Permission sought to share information with:  Family Supports Permission granted to share information::     Name::        Agency::     Relationship::   [name]- Daughter)  Contact Information:     Housing/Transportation Living arrangements for the past 2 months:  Single Family Home Source of Information:  Adult Children [name]- Daughter) Patient Interpreter Needed:  None Criminal Activity/Legal Involvement Pertinent to Current Situation/Hospitalization:  No - Comment as needed Significant Relationships:  Adult Children, Other Family Members, Spouse Lives with:  Spouse (24 hour sitter) Do you feel safe going back to the place where you live?  Yes Need for family participation in patient care:  Yes (Comment) [name]- Daughter)  Care giving concerns:  PT recommends that patient will benefit from SNF pending progress.    Social Worker assessment / plan:  CSW attempted to complete assessment but patient was not in the room. CSW contacted patient's daughter [name] located on the facesheet. Per [name] patient lives home with her husband and 24 hour caregivers. CSW explained Medicare Observation and how it effects her SNF benefits. Patient's daughter reports she understands that patient would have to pay privately for SNF placement due to her observation status. She reports that she does not feel that patient would want to pay out of pocket for SNF. Reported that patient has had HH services in the past with [facility] and inquired if they would possibly by able to provide [facility] services at discharge. Stated if patient remains in observation they would prefer for patient to return home with [facility] services. CSW informed RNCM of above. CSW is signing off. There are no other CSW needs at this time.    Employment status:  Retired Health and safety inspector:  Medicare PT Recommendations:  Skilled Nursing Facility Information / Referral to community resources:  Skilled Nursing Facility  Patient/Family's Response to care:  Patient's daughter reports that she understands that patient is Medicare observation and that she is interested in [facility] services if remains observation status.   Patient/Family's Understanding of and Emotional Response to Diagnosis, Current Treatment, and Prognosis:  Patient's daughter reports she understands patient's Diagnosis, Current Treatment, and Prognosis. Thanked CSW for her assistance.   Emotional Assessment Appearance:  Appears stated age Attitude/Demeanor/Rapport:  Unable to Assess Affect (typically observed):  Unable to Assess Orientation:  Oriented to Self, Oriented to Place, Oriented to  Time Alcohol / Substance use:  Not Applicable Psych involvement (Current and /or in the community):  No (Comment)  Discharge Needs  Concerns to be addressed:  Discharge Planning Concerns Readmission within the last 30 days:  No Current discharge risk:  Chronically ill Barriers to Discharge:  Continued Medical Work up   [place], LCSW 02/21/2016, 3:40 PM

## 2016-02-21 NOTE — Evaluation (Deleted)
Physical Therapy Evaluation Patient Details Name: Kelly Wright MRN: [id number] DOB: 19-Aug-1932 Today's Date: 02/21/2016   History of Present Illness  Pt. is an 82 y.o. female who was admitted to [facility] with Slurred speech, no acute infarct of imaging  Clinical Impression  Pt is able to ambulate with poor confidence and generally needs close supervision and cuing for safety and awareness.  She is able to do limited mobility and transfers but again is slow, guarded and generally needs close supervision for safety.  Pt is unable to answer some questions and overall is unable to report how her current activity level compares to her baseline.     Follow Up Recommendations SNF;Home health PT (per progress)    Equipment Recommendations       Recommendations for Other Services       Precautions / Restrictions Precautions Precautions: Fall Restrictions Weight Bearing Restrictions: No      Mobility  Bed Mobility Overal bed mobility: Modified Independent Bed Mobility: Supine to Sit     Supine to sit: Min assist     General bed mobility comments: Pt is slow and needs extra cuing and assist with the effort and though she is able to get to sitting needs assist to scoot to upright position  Transfers Overall transfer level: Needs assistance Equipment used: Rolling walker (2 wheeled) Transfers: Sit to/from Stand Sit to Stand: Min assist         General transfer comment: Pt is able to get to standing with FWW and light assist to keep weight forward and to maintain balance   Ambulation/Gait Ambulation/Gait assistance: Min assist Ambulation Distance (Feet): 60 Feet Assistive device: Rolling walker (2 wheeled);1 person hand held assist       General Gait Details: Pt is able to ambulate with very slow, cautious ambulation.  She is unable to report if this is her baseline or not, but it is guarded and not safe.    Stairs            Wheelchair Mobility    Modified Rankin  (Stroke Patients Only)       Balance Overall balance assessment: Needs assistance           Standing balance-Leahy Scale: Fair                               Pertinent Vitals/Pain Pain Assessment: No/denies pain Pain Score: 0-No pain    Home Living Family/patient expects to be discharged to:: Private residence Living Arrangements: Alone Available Help at Discharge: Available 24 hours/day;Personal care attendant Type of Home: House Home Access: Stairs to enter   [employer] of Steps: 1 Home Layout: One level Home Equipment: Toilet riser;Shower seat      Prior Function Level of Independence: Needs assistance      ADL's / Homemaking Assistance Needed: 24 hour Caregiver Assist        Hand Dominance        Extremity/Trunk Assessment   Upper Extremity Assessment: Overall WFL for tasks assessed           Lower Extremity Assessment: Generalized weakness         Communication   Communication: No difficulties  Cognition Arousal/Alertness: Awake/alert Behavior During Therapy: WFL for tasks assessed/performed Overall Cognitive Status: History of cognitive impairments - at baseline                      General  Comments      Exercises        Assessment/Plan    PT Assessment Patient needs continued PT services  PT Diagnosis Generalized weakness;Difficulty walking   PT Problem List Pain;Decreased balance;Decreased activity tolerance;Decreased strength;Decreased range of motion;Decreased mobility;Decreased safety awareness  PT Treatment Interventions Gait training;Therapeutic exercise;Balance training;Therapeutic activities;Functional mobility training;Stair training;DME instruction;Neuromuscular re-education   PT Goals (Current goals can be found in the Care Plan section) Acute Rehab PT Goals Patient Stated Goal: To go home. PT Goal Formulation: Patient unable to participate in goal setting Time For Goal Achievement:  03/06/16 Potential to Achieve Goals: Fair    Frequency Min 2X/week   Barriers to discharge        Co-evaluation               End of Session Equipment Utilized During Treatment: Gait belt Activity Tolerance: Patient limited by fatigue Patient left: with chair alarm set;with call bell/phone within reach           Time: 0816-0844 PT Time Calculation (min) (ACUTE ONLY): 28 min   Charges:   PT Evaluation $PT Eval Low Complexity: 1 Procedure     PT G CodesMalachi Pro, DPT 02/21/2016, 10:52 AM

## 2016-02-21 NOTE — Progress Notes (Signed)
*  PRELIMINARY RESULTS* Echocardiogram 2D Echocardiogram has been performed.  Kelly Wright 02/21/2016, 9:29 AM

## 2016-02-22 DIAGNOSIS — G459 Transient cerebral ischemic attack, unspecified: Secondary | ICD-10-CM | POA: Diagnosis not present

## 2016-02-22 DIAGNOSIS — R4781 Slurred speech: Secondary | ICD-10-CM | POA: Diagnosis not present

## 2016-02-22 MED ORDER — ASPIRIN EC 81 MG PO TBEC: 81.0000 mg | DELAYED_RELEASE_TABLET | Freq: Every day | ORAL | 0 refills | Status: DC

## 2016-02-22 MED ORDER — CEPHALEXIN 500 MG PO CAPS: 500.0000 mg | ORAL_CAPSULE | Freq: Two times a day (BID) | ORAL | 0 refills | Status: DC

## 2016-02-22 NOTE — Discharge Summary (Signed)
[employer] [facility] [place] at [facility]   PATIENT NAME: Kelly Wright    MR#:  [id number]  DATE OF BIRTH:  Feb 28, 1933  DATE OF ADMISSION:  02/20/2016 ADMITTING PHYSICIAN: Oralia Manis, MD  DATE OF DISCHARGE: 02/22/2016  PRIMARY CARE PHYSICIAN: Leanna Sato, MD    ADMISSION DIAGNOSIS:  Slurred speech [R47.81]  DISCHARGE DIAGNOSIS:  Principal Problem:   Slurred speech Active Problems:   Rheumatoid arthritis (HCC)   Atrial fibrillation (HCC)   H/O: CVA (cerebrovascular accident)   Depression   Infection of urinary tract   Dementia   Generalized weakness   SECONDARY DIAGNOSIS:   Past Medical History:  Diagnosis Date  . Arthritis   . Atrial fibrillation (HCC)   . Cerebral vascular accident (HCC)   . DVT (deep venous thrombosis) (HCC)   . Dysrhythmia     HOSPITAL COURSE:   82 year old female with past medical history of atrial fibrillation, osteoarthritis, previous history of CVA, DVT with dementia who presented to the hospital due to altered mental status and slurred speech.  1. TIA/CVA-patient was admitted to the hospital with a working diagnosis of TIA. She underwent an extensive workup including a CT head, MRI of the brain, carotid duplex and echocardiogram which have all been essentially within normal limits. -Her slurred speech has resolved and she has no other focal neurological deficits. -She was seen by physical therapy and he recommended short-term rehabilitation but she is under observation not being discharged home with home health services. She will continue baby aspirin. She was seen by neurology agreed with this management. -Patient also had an EEG done which showed no evidence of acute seizures.  2. Dementia-patient will resume her Namenda and Aricept.  3. History of rheumatoid arthritis-continue methotrexate.  4. Urinary tract infection-patient will be discharged on oral Keflex.  5. Depression-patient will resume her Celexa.  Patient is  being arranged for home health physical therapy and nursing services.   DISCHARGE CONDITIONS:   Stable  CONSULTS OBTAINED:  Treatment Team:  Thana Farr, MD  DRUG ALLERGIES:  No Known Allergies  DISCHARGE MEDICATIONS:     Medication List    TAKE these medications   aspirin EC 81 MG tablet Take 1 tablet (81 mg total) by mouth daily.   cephALEXin 500 MG capsule Commonly known as:  KEFLEX Take 1 capsule (500 mg total) by mouth every 12 (twelve) hours.   citalopram 20 MG tablet Commonly known as:  CELEXA Take 30 mg by mouth at bedtime.   donepezil 10 MG tablet Commonly known as:  ARICEPT Take 10 mg by mouth at bedtime.   folic acid 400 MCG tablet Commonly known as:  FOLVITE Take 400 mcg by mouth at bedtime.   memantine 10 MG tablet Commonly known as:  NAMENDA Take 10 mg by mouth 2 (two) times daily.   methotrexate 2.5 MG tablet Commonly known as:  RHEUMATREX Take 12.5 mg by mouth every Wednesday.         DISCHARGE INSTRUCTIONS:   DIET:  Cardiac diet  DISCHARGE CONDITION:  Stable  ACTIVITY:  Activity as tolerated  OXYGEN:  Home Oxygen: No.   Oxygen Delivery: room air  DISCHARGE LOCATION:  Home with Home Health.    If you experience worsening of your admission symptoms, develop shortness of breath, life threatening emergency, suicidal or homicidal thoughts you must seek medical attention immediately by calling 911 or calling your MD immediately  if symptoms less severe.  You Must read complete instructions/literature along with all the  possible adverse reactions/side effects for all the Medicines you take and that have been prescribed to you. Take any new Medicines after you have completely understood and accpet all the possible adverse reactions/side effects.   Please note  You were cared for by a hospitalist during your hospital stay. If you have any questions about your discharge medications or the care you received while you were in the  hospital after you are discharged, you can call the unit and asked to speak with the hospitalist on call if the hospitalist that took care of you is not available. Once you are discharged, your primary care physician will handle any further medical issues. Please note that NO REFILLS for any discharge medications will be authorized once you are discharged, as it is imperative that you return to your primary care physician (or establish a relationship with a primary care physician if you do not have one) for your aftercare needs so that they can reassess your need for medications and monitor your lab values.     Today   No complaints presently.  Slurred speech resolved.  D/c home today.   VITAL SIGNS:  Blood pressure (!) 101/46, pulse (!) 59, temperature 98.7 F (37.1 C), temperature source Oral, resp. rate 18, height 5\' 9"  (1.753 m), weight 58.9 kg (129 lb 14.4 oz), SpO2 98 %.  I/O:   Intake/Output Summary (Last 24 hours) at 02/22/16 1228 Last data filed at 02/22/16 0800  Gross per 24 hour  Intake              240 ml  Output                0 ml  Net              240 ml    PHYSICAL EXAMINATION:   GENERAL:  82 y.o.-year-old patient lying in the bed with no acute distress.  EYES: Pupils equal, round, reactive to light and accommodation. No scleral icterus. Extraocular muscles intact.  HEENT: Head atraumatic, normocephalic. Oropharynx and nasopharynx clear.  NECK:  Supple, no jugular venous distention. No thyroid enlargement, no tenderness.  LUNGS: Normal breath sounds bilaterally, no wheezing, rales,rhonchi. No use of accessory muscles of respiration.  CARDIOVASCULAR: S1, S2 normal. No murmurs, rubs, or gallops.  ABDOMEN: Soft, non-tender, non-distended. Bowel sounds present. No organomegaly or mass.  EXTREMITIES: No pedal edema, cyanosis, or clubbing.  NEUROLOGIC: Cranial nerves II through XII are intact. No focal motor or sensory defecits b/l.  PSYCHIATRIC: The patient is alert and  oriented x 3. Good affect.  SKIN: No obvious rash, lesion, or ulcer.   DATA REVIEW:   CBC  Recent Labs Lab 02/20/16 1707  WBC 5.4  HGB 13.5  HCT 39.4  PLT 151    Chemistries   Recent Labs Lab 02/20/16 1707  NA 141  K 3.5  CL 104  CO2 29  GLUCOSE 152*  BUN 17  CREATININE 0.89  CALCIUM 9.3  AST 21  ALT 12*  ALKPHOS 47  BILITOT 0.8    Cardiac Enzymes  Recent Labs Lab 02/20/16 1707  TROPONINI <0.03    Microbiology Results  Results for orders placed or performed during the hospital encounter of 08/10/15  Urine culture     Status: None   Collection Time: 08/10/15 10:34 PM  Result Value Ref Range Status   Specimen Description URINE, RANDOM  Final   Special Requests NONE  Final   Culture NO GROWTH 1 DAY  Final  Report Status 08/12/2015 FINAL  Final    RADIOLOGY:  Dg Chest 2 View  Result Date: 02/20/2016 CLINICAL DATA:  Slurred speech, weakness, dizziness, nausea vomiting. EXAM: CHEST  2 VIEW COMPARISON:  08/10/2015 FINDINGS: Cardiomediastinal silhouette is normal. Mediastinal contours appear intact. The lungs are hyperinflated.  No focal airspace consolidations seen. Osseous structures are without acute abnormality. Healed left-sided rib fractures. Soft tissues are grossly normal. IMPRESSION: No active cardiopulmonary disease. COPD. Electronically Signed   By: Ted Mcalpine M.D.   On: 02/20/2016 22:30   Mr Brain Wo Contrast  Result Date: 02/21/2016 CLINICAL DATA:  Code stroke yesterday. Slurred speech, dizziness, nausea, and near syncopal episode. Weakness. EXAM: MRI HEAD WITHOUT CONTRAST MRA HEAD WITHOUT CONTRAST TECHNIQUE: Multiplanar, multiecho pulse sequences of the brain and surrounding structures were obtained without intravenous contrast. Angiographic images of the head were obtained using MRA technique without contrast. COMPARISON:  CT head without contrast 02/20/2016. FINDINGS: MRI HEAD FINDINGS The diffusion-weighted images demonstrate no evidence  for acute or subacute infarction. No acute hemorrhage or mass lesion is present. Moderate atrophy and periventricular white matter changes are similar to the prior exam. The ventricles are proportionate to the degree of atrophy. No significant extra-axial fluid collection is present. Dilated perivascular spaces are noted within the basal ganglia bilaterally. The internal auditory canals are within normal limits bilaterally. The brainstem and cerebellum are normal. Flow is present in the major intracranial arteries. Bilateral lens replacements are present. The paranasal sinuses are clear. There is some fluid in the mastoid air cells bilaterally. No obstructing nasopharyngeal lesion is present. MRA HEAD FINDINGS The study is mildly degraded by patient motion. This obscures signal. Atherosclerotic changes within the cavernous internal carotid arteries bilaterally is exaggerated by artifact. The ICA termini are intact. The A1 and M1 segments are normal. The anterior communicating artery is dominant on the right. Both A2 segments fill. The anterior communicating artery is patent. There is artifactual signal loss in the distal M1 segments bilaterally. MCA branch vessels are within normal limits bilaterally. The right vertebral artery is the dominant vessel. The vertebrobasilar junction is normal. PICA origins are not discretely seen. The basilar artery is within normal limits. Both posterior cerebral arteries originate from basilar tip. There is artifactual signal loss in the proximal posterior cerebral arteries bilaterally. There is some attenuation of distal PCA branch vessels. IMPRESSION: 1. No acute or focal intracranial abnormality to explain slurred speech. 2. Moderate atrophy and white matter disease is similar the prior study, within normal limits for age. 3. MRA demonstrates mild distal small vessel disease without significant proximal stenosis, aneurysm, or branch vessel occlusion. 4. The MRA is somewhat limited  by patient motion, altering signal within the proximal PCA branch vessels and distal M1 segments bilaterally. Electronically Signed   By: Marin Roberts M.D.   On: 02/21/2016 14:29   US Carotid Bilateral  Result Date: 02/21/2016 CLINICAL DATA:  Slurred speech. EXAM: BILATERAL CAROTID DUPLEX ULTRASOUND TECHNIQUE: Wallace Cullens scale imaging, color Doppler and duplex ultrasound were performed of bilateral carotid and vertebral arteries in the neck. COMPARISON:  Carotid Doppler ultrasound - 04/13/2014; 11/20/2011 FINDINGS: Criteria: Quantification of carotid stenosis is based on velocity parameters that correlate the residual internal carotid diameter with NASCET-based stenosis levels, using the diameter of the distal internal carotid lumen as the denominator for stenosis measurement. The following velocity measurements were obtained: RIGHT ICA:  77/16 cm/sec CCA:  46/9 cm/sec SYSTOLIC ICA/CCA RATIO:  1.7 DIASTOLIC ICA/CCA RATIO:  1.8 ECA:  66 cm/sec LEFT  ICA:  110/6 cm/sec CCA:  62/10 cm/sec SYSTOLIC ICA/CCA RATIO:  1.8 DIASTOLIC ICA/CCA RATIO:  2.2 ECA:  65 cm/sec RIGHT CAROTID ARTERY: There is no grayscale evidence of significant intimal thickening or atherosclerotic plaque affecting interrogated portions of the right carotid system. There are no elevated peak systolic velocities within the interrogated course the right internal carotid artery to suggest a hemodynamically significant stenosis. RIGHT VERTEBRAL ARTERY:  Antegrade Flow LEFT CAROTID ARTERY: There is a minimal amount of atherosclerotic plaque within the left carotid bulb (images 38 and 39), morphologically similar to the 04/2014 examination and again not resulting in elevated peak systolic velocities within the interrogated course the left internal carotid artery to suggest a hemodynamically significant stenosis. LEFT VERTEBRAL ARTERY:  Antegrade Flow IMPRESSION: 1. Very minimal amount of left-sided atherosclerotic plaque, morphologically similar to the  04/2014 examination and again not resulting in a hemodynamically significant stenosis within the left internal carotid artery. 2. Normal sonographic evaluation of the right carotid system. Electronically Signed   By: Simonne Come M.D.   On: 02/21/2016 11:12   Mr Maxine Glenn Head/brain AV Cm  Result Date: 02/21/2016 CLINICAL DATA:  Code stroke yesterday. Slurred speech, dizziness, nausea, and near syncopal episode. Weakness. EXAM: MRI HEAD WITHOUT CONTRAST MRA HEAD WITHOUT CONTRAST TECHNIQUE: Multiplanar, multiecho pulse sequences of the brain and surrounding structures were obtained without intravenous contrast. Angiographic images of the head were obtained using MRA technique without contrast. COMPARISON:  CT head without contrast 02/20/2016. FINDINGS: MRI HEAD FINDINGS The diffusion-weighted images demonstrate no evidence for acute or subacute infarction. No acute hemorrhage or mass lesion is present. Moderate atrophy and periventricular white matter changes are similar to the prior exam. The ventricles are proportionate to the degree of atrophy. No significant extra-axial fluid collection is present. Dilated perivascular spaces are noted within the basal ganglia bilaterally. The internal auditory canals are within normal limits bilaterally. The brainstem and cerebellum are normal. Flow is present in the major intracranial arteries. Bilateral lens replacements are present. The paranasal sinuses are clear. There is some fluid in the mastoid air cells bilaterally. No obstructing nasopharyngeal lesion is present. MRA HEAD FINDINGS The study is mildly degraded by patient motion. This obscures signal. Atherosclerotic changes within the cavernous internal carotid arteries bilaterally is exaggerated by artifact. The ICA termini are intact. The A1 and M1 segments are normal. The anterior communicating artery is dominant on the right. Both A2 segments fill. The anterior communicating artery is patent. There is artifactual  signal loss in the distal M1 segments bilaterally. MCA branch vessels are within normal limits bilaterally. The right vertebral artery is the dominant vessel. The vertebrobasilar junction is normal. PICA origins are not discretely seen. The basilar artery is within normal limits. Both posterior cerebral arteries originate from basilar tip. There is artifactual signal loss in the proximal posterior cerebral arteries bilaterally. There is some attenuation of distal PCA branch vessels. IMPRESSION: 1. No acute or focal intracranial abnormality to explain slurred speech. 2. Moderate atrophy and white matter disease is similar the prior study, within normal limits for age. 3. MRA demonstrates mild distal small vessel disease without significant proximal stenosis, aneurysm, or branch vessel occlusion. 4. The MRA is somewhat limited by patient motion, altering signal within the proximal PCA branch vessels and distal M1 segments bilaterally. Electronically Signed   By: Marin Roberts M.D.   On: 02/21/2016 14:29   Ct Head Code Stroke W/o Cm  Result Date: 02/20/2016 CLINICAL DATA:  Code stroke. Slurred speech, dizziness, nausea,  and near syncopal episode, and weakness. EXAM: CT HEAD WITHOUT CONTRAST TECHNIQUE: Contiguous axial images were obtained from the base of the skull through the vertex without intravenous contrast. COMPARISON:  08/10/2015 FINDINGS: There is no evidence of acute cortical infarct, intracranial hemorrhage, mass, midline shift, or extra-axial fluid collection. Moderate cerebral atrophy is unchanged. Periventricular white matter hypodensities are unchanged and nonspecific but compatible with moderate chronic small vessel ischemic disease. A small chronic infarct is again seen involving the anterior left insula. Prior bilateral cataract extraction is noted. The visualized paranasal sinuses and mastoid air cells are clear. Mild calcified atherosclerosis is noted at the skullbase. No acute osseous  abnormality is seen. ASPECTS [facility] Stroke Program Early CT Score, [email or web address]) - Ganglionic level infarction (caudate, lentiform nuclei, internal capsule, insula, M1-M3 cortex): 7 - Supraganglionic infarction (M4-M6 cortex): 3 Total score (0-10 with 10 being normal): 10 IMPRESSION: 1. No evidence of acute intracranial abnormality. 2. ASPECTS score 10. 3. Moderate chronic small vessel ischemic disease and cerebral atrophy. These results were called by telephone at the time of interpretation on 02/20/2016 at 5:15 pm to Dr. Dorothea Glassman , who verbally acknowledged these results. Electronically Signed   By: Sebastian Ache M.D.   On: 02/20/2016 17:16      Management plans discussed with the patient, family and they are in agreement.  CODE STATUS:     Code Status Orders        Start     Ordered   02/20/16 2142  Full code  Continuous     02/20/16 2141    Code Status History    Date Active Date Inactive Code Status Order ID Comments User Context   02/20/2016  9:41 PM 02/21/2016  8:42 AM Full Code [id number]  Oralia Manis, MD Inpatient   08/11/2015  1:14 AM 08/12/2015  9:25 PM Full Code [id number]  Arnaldo Natal, MD ED   11/12/2014  2:35 AM 11/16/2014  9:54 PM Full Code [id number]  Vernell Barrier, MD Inpatient    Advance Directive Documentation   Flowsheet Row Most Recent Value  Type of Advance Directive  Healthcare Power of Attorney  Pre-existing out of facility DNR order (yellow form or pink MOST form)  No data  "MOST" Form in Place?  No data      TOTAL TIME TAKING CARE OF THIS PATIENT: 40 minutes.    Houston Siren M.D on 02/22/2016 at 12:28 PM  Between 7am to 6pm - Pager - [phone]  After 6pm go to [email or web address] - password [email or web address] [facility]  [place] [facility]  Office  [phone]  CC: Primary care physician; Leanna Sato, MD

## 2016-02-22 NOTE — Care Management Note (Signed)
Case Management Note  Patient Details  Name: YAMINA LENIS MRN: [id number] Date of Birth: 03/09/1933  Subjective/Objective:  A referral for home health PT and RN was faxed to [facility] (patient's choice of providers). Ms Outen has a walker at home and private 24/7 caregivers in her home.                   Action/Plan:   Expected Discharge Date:                  Expected Discharge Plan:     In-House Referral:     Discharge planning Services     Post Acute Care Choice:    Choice offered to:     DME Arranged:    DME Agency:     HH Arranged:    HH Agency:     Status of Service:     If discussed at [employer] of Stay Meetings, dates discussed:    Additional Comments:  Honestie Kulik A, RN 02/22/2016, 8:56 AM

## 2016-02-22 NOTE — Progress Notes (Signed)
Subjective: Patient remains at baseline.  No new neurological complaints.    Objective: Current vital signs: BP (!) 101/46 (BP Location: Left Arm)   Pulse (!) 59   Temp 98.7 F (37.1 C) (Oral)   Resp 18   Ht 5\' 9"  (1.753 m)   Wt 58.9 kg (129 lb 14.4 oz)   SpO2 98%   BMI 19.18 kg/m  Vital signs in last 24 hours: Temp:  [97.8 F (36.6 C)-98.7 F (37.1 C)] 98.7 F (37.1 C) (08/12 1150) Pulse Rate:  [59-61] 59 (08/12 1150) Resp:  [18-20] 18 (08/12 1150) BP: (101-132)/(46-55) 101/46 (08/12 1150) SpO2:  [96 %-100 %] 98 % (08/12 1150)  Intake/Output from previous day: No intake/output data recorded. Intake/Output this shift: Total I/O In: 240 [P.O.:240] Out: -  Nutritional status: Diet heart healthy/carb modified Room service appropriate? Yes; Fluid consistency: Thin Diet - low sodium heart healthy  Neurologic Exam: Mental Status: Alert, oriented, thought content appropriate.  Speech fluent without evidence of aphasia.  Able to follow 3 step commands without difficulty. Cranial Nerves: II: Discs flat bilaterally; Visual fields grossly normal, pupils equal, round, reactive to light and accommodation III,IV, VI: ptosis not present, extra-ocular motions intact bilaterally V,VII: smile symmetric, facial light touch sensation normal bilaterally VIII: hearing normal bilaterally IX,X: gag reflex present XI: bilateral shoulder shrug XII: midline tongue extension Motor: Right :            Upper extremity   5/5                                                                              Left:     Upper extremity   5/5                       Lower extremity   5/5                                                                                                    Lower extremity   5/5 Tone and bulk:normal tone throughout; no atrophy noted Sensory: Pinprick and light touch intact throughout, bilaterally Deep Tendon Reflexes: 2+ and symmetric with absent AJ's bilaterally    Lab  Results: Basic Metabolic Panel:  Recent Labs Lab 02/20/16 1707  NA 141  K 3.5  CL 104  CO2 29  GLUCOSE 152*  BUN 17  CREATININE 0.89  CALCIUM 9.3    Liver Function Tests:  Recent Labs Lab 02/20/16 1707  AST 21  ALT 12*  ALKPHOS 47  BILITOT 0.8  PROT 6.7  ALBUMIN 4.0   No results for input(s): LIPASE, AMYLASE in the last 168 hours. No results for input(s): AMMONIA in the last 168 hours.  CBC:  Recent Labs Lab 02/20/16 1707  WBC 5.4  NEUTROABS  3.8  HGB 13.5  HCT 39.4  MCV 99.0  PLT 151    Cardiac Enzymes:  Recent Labs Lab 02/20/16 1707  TROPONINI <0.03    Lipid Panel:  Recent Labs Lab 02/21/16 0630  CHOL 173  TRIG 57  HDL 69  CHOLHDL 2.5  VLDL 11  LDLCALC 93    CBG:  Recent Labs Lab 02/20/16 1709  GLUCAP 130*    Microbiology: Results for orders placed or performed during the hospital encounter of 08/10/15  Urine culture     Status: None   Collection Time: 08/10/15 10:34 PM  Result Value Ref Range Status   Specimen Description URINE, RANDOM  Final   Special Requests NONE  Final   Culture NO GROWTH 1 DAY  Final   Report Status 08/12/2015 FINAL  Final    Coagulation Studies:  Recent Labs  02/20/16 1707  LABPROT 14.0  INR 1.08    Imaging: Dg Chest 2 View  Result Date: 02/20/2016 CLINICAL DATA:  Slurred speech, weakness, dizziness, nausea vomiting. EXAM: CHEST  2 VIEW COMPARISON:  08/10/2015 FINDINGS: Cardiomediastinal silhouette is normal. Mediastinal contours appear intact. The lungs are hyperinflated.  No focal airspace consolidations seen. Osseous structures are without acute abnormality. Healed left-sided rib fractures. Soft tissues are grossly normal. IMPRESSION: No active cardiopulmonary disease. COPD. Electronically Signed   By: Ted Mcalpine M.D.   On: 02/20/2016 22:30   Mr Brain Wo Contrast  Result Date: 02/21/2016 CLINICAL DATA:  Code stroke yesterday. Slurred speech, dizziness, nausea, and near syncopal  episode. Weakness. EXAM: MRI HEAD WITHOUT CONTRAST MRA HEAD WITHOUT CONTRAST TECHNIQUE: Multiplanar, multiecho pulse sequences of the brain and surrounding structures were obtained without intravenous contrast. Angiographic images of the head were obtained using MRA technique without contrast. COMPARISON:  CT head without contrast 02/20/2016. FINDINGS: MRI HEAD FINDINGS The diffusion-weighted images demonstrate no evidence for acute or subacute infarction. No acute hemorrhage or mass lesion is present. Moderate atrophy and periventricular white matter changes are similar to the prior exam. The ventricles are proportionate to the degree of atrophy. No significant extra-axial fluid collection is present. Dilated perivascular spaces are noted within the basal ganglia bilaterally. The internal auditory canals are within normal limits bilaterally. The brainstem and cerebellum are normal. Flow is present in the major intracranial arteries. Bilateral lens replacements are present. The paranasal sinuses are clear. There is some fluid in the mastoid air cells bilaterally. No obstructing nasopharyngeal lesion is present. MRA HEAD FINDINGS The study is mildly degraded by patient motion. This obscures signal. Atherosclerotic changes within the cavernous internal carotid arteries bilaterally is exaggerated by artifact. The ICA termini are intact. The A1 and M1 segments are normal. The anterior communicating artery is dominant on the right. Both A2 segments fill. The anterior communicating artery is patent. There is artifactual signal loss in the distal M1 segments bilaterally. MCA branch vessels are within normal limits bilaterally. The right vertebral artery is the dominant vessel. The vertebrobasilar junction is normal. PICA origins are not discretely seen. The basilar artery is within normal limits. Both posterior cerebral arteries originate from basilar tip. There is artifactual signal loss in the proximal posterior cerebral  arteries bilaterally. There is some attenuation of distal PCA branch vessels. IMPRESSION: 1. No acute or focal intracranial abnormality to explain slurred speech. 2. Moderate atrophy and white matter disease is similar the prior study, within normal limits for age. 3. MRA demonstrates mild distal small vessel disease without significant proximal stenosis, aneurysm, or branch vessel occlusion. 4.  The MRA is somewhat limited by patient motion, altering signal within the proximal PCA branch vessels and distal M1 segments bilaterally. Electronically Signed   By: Marin Roberts M.D.   On: 02/21/2016 14:29   US Carotid Bilateral  Result Date: 02/21/2016 CLINICAL DATA:  Slurred speech. EXAM: BILATERAL CAROTID DUPLEX ULTRASOUND TECHNIQUE: Wallace Cullens scale imaging, color Doppler and duplex ultrasound were performed of bilateral carotid and vertebral arteries in the neck. COMPARISON:  Carotid Doppler ultrasound - 04/13/2014; 11/20/2011 FINDINGS: Criteria: Quantification of carotid stenosis is based on velocity parameters that correlate the residual internal carotid diameter with NASCET-based stenosis levels, using the diameter of the distal internal carotid lumen as the denominator for stenosis measurement. The following velocity measurements were obtained: RIGHT ICA:  77/16 cm/sec CCA:  46/9 cm/sec SYSTOLIC ICA/CCA RATIO:  1.7 DIASTOLIC ICA/CCA RATIO:  1.8 ECA:  66 cm/sec LEFT ICA:  110/6 cm/sec CCA:  62/10 cm/sec SYSTOLIC ICA/CCA RATIO:  1.8 DIASTOLIC ICA/CCA RATIO:  2.2 ECA:  65 cm/sec RIGHT CAROTID ARTERY: There is no grayscale evidence of significant intimal thickening or atherosclerotic plaque affecting interrogated portions of the right carotid system. There are no elevated peak systolic velocities within the interrogated course the right internal carotid artery to suggest a hemodynamically significant stenosis. RIGHT VERTEBRAL ARTERY:  Antegrade Flow LEFT CAROTID ARTERY: There is a minimal amount of  atherosclerotic plaque within the left carotid bulb (images 38 and 39), morphologically similar to the 04/2014 examination and again not resulting in elevated peak systolic velocities within the interrogated course the left internal carotid artery to suggest a hemodynamically significant stenosis. LEFT VERTEBRAL ARTERY:  Antegrade Flow IMPRESSION: 1. Very minimal amount of left-sided atherosclerotic plaque, morphologically similar to the 04/2014 examination and again not resulting in a hemodynamically significant stenosis within the left internal carotid artery. 2. Normal sonographic evaluation of the right carotid system. Electronically Signed   By: Simonne Come M.D.   On: 02/21/2016 11:12   Mr Maxine Glenn Head/brain UT Cm  Result Date: 02/21/2016 CLINICAL DATA:  Code stroke yesterday. Slurred speech, dizziness, nausea, and near syncopal episode. Weakness. EXAM: MRI HEAD WITHOUT CONTRAST MRA HEAD WITHOUT CONTRAST TECHNIQUE: Multiplanar, multiecho pulse sequences of the brain and surrounding structures were obtained without intravenous contrast. Angiographic images of the head were obtained using MRA technique without contrast. COMPARISON:  CT head without contrast 02/20/2016. FINDINGS: MRI HEAD FINDINGS The diffusion-weighted images demonstrate no evidence for acute or subacute infarction. No acute hemorrhage or mass lesion is present. Moderate atrophy and periventricular white matter changes are similar to the prior exam. The ventricles are proportionate to the degree of atrophy. No significant extra-axial fluid collection is present. Dilated perivascular spaces are noted within the basal ganglia bilaterally. The internal auditory canals are within normal limits bilaterally. The brainstem and cerebellum are normal. Flow is present in the major intracranial arteries. Bilateral lens replacements are present. The paranasal sinuses are clear. There is some fluid in the mastoid air cells bilaterally. No obstructing  nasopharyngeal lesion is present. MRA HEAD FINDINGS The study is mildly degraded by patient motion. This obscures signal. Atherosclerotic changes within the cavernous internal carotid arteries bilaterally is exaggerated by artifact. The ICA termini are intact. The A1 and M1 segments are normal. The anterior communicating artery is dominant on the right. Both A2 segments fill. The anterior communicating artery is patent. There is artifactual signal loss in the distal M1 segments bilaterally. MCA branch vessels are within normal limits bilaterally. The right vertebral artery is the dominant vessel. The  vertebrobasilar junction is normal. PICA origins are not discretely seen. The basilar artery is within normal limits. Both posterior cerebral arteries originate from basilar tip. There is artifactual signal loss in the proximal posterior cerebral arteries bilaterally. There is some attenuation of distal PCA branch vessels. IMPRESSION: 1. No acute or focal intracranial abnormality to explain slurred speech. 2. Moderate atrophy and white matter disease is similar the prior study, within normal limits for age. 3. MRA demonstrates mild distal small vessel disease without significant proximal stenosis, aneurysm, or branch vessel occlusion. 4. The MRA is somewhat limited by patient motion, altering signal within the proximal PCA branch vessels and distal M1 segments bilaterally. Electronically Signed   By: Marin Roberts M.D.   On: 02/21/2016 14:29   Ct Head Code Stroke W/o Cm  Result Date: 02/20/2016 CLINICAL DATA:  Code stroke. Slurred speech, dizziness, nausea, and near syncopal episode, and weakness. EXAM: CT HEAD WITHOUT CONTRAST TECHNIQUE: Contiguous axial images were obtained from the base of the skull through the vertex without intravenous contrast. COMPARISON:  08/10/2015 FINDINGS: There is no evidence of acute cortical infarct, intracranial hemorrhage, mass, midline shift, or extra-axial fluid collection.  Moderate cerebral atrophy is unchanged. Periventricular white matter hypodensities are unchanged and nonspecific but compatible with moderate chronic small vessel ischemic disease. A small chronic infarct is again seen involving the anterior left insula. Prior bilateral cataract extraction is noted. The visualized paranasal sinuses and mastoid air cells are clear. Mild calcified atherosclerosis is noted at the skullbase. No acute osseous abnormality is seen. ASPECTS [facility] Stroke Program Early CT Score, [email or web address]) - Ganglionic level infarction (caudate, lentiform nuclei, internal capsule, insula, M1-M3 cortex): 7 - Supraganglionic infarction (M4-M6 cortex): 3 Total score (0-10 with 10 being normal): 10 IMPRESSION: 1. No evidence of acute intracranial abnormality. 2. ASPECTS score 10. 3. Moderate chronic small vessel ischemic disease and cerebral atrophy. These results were called by telephone at the time of interpretation on 02/20/2016 at 5:15 pm to Dr. Dorothea Glassman , who verbally acknowledged these results. Electronically Signed   By: Sebastian Ache M.D.   On: 02/20/2016 17:16    Medications:  I have reviewed the patient's current medications. Scheduled: . cephALEXin  500 mg Oral Q12H  . enoxaparin (LOVENOX) injection  40 mg Subcutaneous Q24H    Assessment/Plan: Patient remains stable.  Echocardiogram shows no cardiac source of emboli.  EEG was unremarkable.    Recommendations: 1.  Would continue off Namenda and Aricept to be further evaluated by her outpatient physician.   2.  Patient to follow up with neurology as an outpatient   LOS: 0 days   Thana Farr, MD Neurology [phone] 02/22/2016  1:11 PM

## 2016-02-22 NOTE — Progress Notes (Signed)
Patient discharged home per MD orders. Prescriptions given to patient. All discharge instructions given and all questions answered. 

## 2016-08-09 ENCOUNTER — Emergency Department: Payer: Medicare Other

## 2016-08-09 ENCOUNTER — Encounter: Payer: Self-pay | Admitting: Emergency Medicine

## 2016-08-09 ENCOUNTER — Emergency Department
Admission: EM | Admit: 2016-08-09 | Discharge: 2016-08-09 | Disposition: A | Discharge status: Home / Self Care | Payer: Medicare Other | Attending: Student in an Organized Health Care Education/Training Program | Admitting: Student in an Organized Health Care Education/Training Program

## 2016-08-09 DIAGNOSIS — Z79899 Other long term (current) drug therapy: Secondary | ICD-10-CM | POA: Diagnosis not present

## 2016-08-09 DIAGNOSIS — E860 Dehydration: Secondary | ICD-10-CM | POA: Insufficient documentation

## 2016-08-09 DIAGNOSIS — R531 Weakness: Secondary | ICD-10-CM | POA: Diagnosis present

## 2016-08-09 DIAGNOSIS — Z7982 Long term (current) use of aspirin: Secondary | ICD-10-CM | POA: Diagnosis not present

## 2016-08-09 DIAGNOSIS — F0390 Unspecified dementia without behavioral disturbance: Secondary | ICD-10-CM | POA: Diagnosis not present

## 2016-08-09 LAB — CBC
HEMATOCRIT: 34.6 % — AB (ref 35.0–47.0)
HEMOGLOBIN: 12 g/dL (ref 12.0–16.0)
MCH: 34 pg (ref 26.0–34.0)
MCHC: 34.8 g/dL (ref 32.0–36.0)
MCV: 97.7 fL (ref 80.0–100.0)
Platelets: 149 10*3/uL — ABNORMAL LOW (ref 150–440)
RBC: 3.54 MIL/uL — AB (ref 3.80–5.20)
RDW: 15.2 % — ABNORMAL HIGH (ref 11.5–14.5)
WBC: 5.2 10*3/uL (ref 3.6–11.0)

## 2016-08-09 LAB — BASIC METABOLIC PANEL
ANION GAP: 4 — AB (ref 5–15)
BUN: 18 mg/dL (ref 6–20)
CO2: 31 mmol/L (ref 22–32)
Calcium: 8.6 mg/dL — ABNORMAL LOW (ref 8.9–10.3)
Chloride: 103 mmol/L (ref 101–111)
Creatinine, Ser: 0.77 mg/dL (ref 0.44–1.00)
GFR calc Af Amer: >60 mL/min (ref >60)
GLUCOSE: 123 mg/dL — AB (ref 65–99)
POTASSIUM: 3.5 mmol/L (ref 3.5–5.1)
Sodium: 138 mmol/L (ref 135–145)

## 2016-08-09 LAB — URINALYSIS, COMPLETE (UACMP) WITH MICROSCOPIC
BACTERIA UA: NONE SEEN
BILIRUBIN URINE: NEGATIVE
Glucose, UA: NEGATIVE mg/dL
HGB URINE DIPSTICK: NEGATIVE
KETONES UR: NEGATIVE mg/dL
LEUKOCYTES UA: NEGATIVE
NITRITE: NEGATIVE
PROTEIN: NEGATIVE mg/dL
Specific Gravity, Urine: 1.023 (ref 1.005–1.030)
pH: 5 (ref 5.0–8.0)

## 2016-08-09 MED ORDER — SODIUM CHLORIDE 0.9 % IV BOLUS (SEPSIS)
1000.0000 mL | Freq: Once | INTRAVENOUS | Status: AC
  Administered 2016-08-09: 1000 mL via INTRAVENOUS

## 2016-08-09 NOTE — ED Provider Notes (Signed)
[facility] Emergency Department Provider Note    First MD Initiated Contact with Patient 08/09/16 1624     (approximate)  I have reviewed the triage vital signs and the nursing notes.   HISTORY  Chief Complaint Weakness and Dizziness    HPI Kelly Wright is a 83 y.o. female with a history of severe dementia presents with chief complaint of weakness dizziness and lightheadedness with standing over the past several days. States that on Wednesday she started having watery diarrhea. No blood in it. No measured fevers. No nausea or vomiting. No chills. Patient otherwise acting at baseline. No recent changes in medications. Due to the lightheadedness and dizziness with decreased oral intake patient has had several falls from standing there were witnessed by caregivers. Caregiver at bedside states that she thinks the patient might of hit her head yesterday.   Past Medical History:  Diagnosis Date  . Arthritis   . Atrial fibrillation (HCC)   . Cerebral vascular accident (HCC)   . DVT (deep venous thrombosis) (HCC)   . Dysrhythmia    Family History  Problem Relation Age of Onset  . Hodgkin's lymphoma Mother   . Cancer - Lung Father   . CVA Other    Past Surgical History:  Procedure Laterality Date  . ABDOMINAL HYSTERECTOMY     Patient Active Problem List   Diagnosis Date Noted  . Infection of urinary tract 02/21/2016  . Dementia 02/21/2016  . Generalized weakness 02/21/2016  . Slurred speech 02/20/2016  . Depression 02/20/2016  . Broken ribs 08/11/2015  . UTI (lower urinary tract infection) 11/12/2014  . Encephalopathy, metabolic 11/12/2014  . Rheumatoid arthritis (HCC) 11/12/2014  . Chronic anticoagulation 11/12/2014  . Atrial fibrillation (HCC) 11/12/2014  . H/O: CVA (cerebrovascular accident) 11/12/2014      Prior to Admission medications   Medication Sig Start Date End Date Taking? Authorizing Provider  aspirin EC 81 MG tablet Take 1  tablet (81 mg total) by mouth daily. 02/22/16   Houston Siren, MD  cephALEXin (KEFLEX) 500 MG capsule Take 1 capsule (500 mg total) by mouth every 12 (twelve) hours. 02/22/16   Houston Siren, MD  citalopram (CELEXA) 20 MG tablet Take 30 mg by mouth at bedtime.     Historical Provider, MD  donepezil (ARICEPT) 10 MG tablet Take 10 mg by mouth at bedtime.    Historical Provider, MD  folic acid (FOLVITE) 400 MCG tablet Take 400 mcg by mouth at bedtime.    Historical Provider, MD  memantine (NAMENDA) 10 MG tablet Take 10 mg by mouth 2 (two) times daily.    Historical Provider, MD  methotrexate (RHEUMATREX) 2.5 MG tablet Take 12.5 mg by mouth every Wednesday.     Historical Provider, MD    Allergies Patient has no known allergies.    Social History Social History  Substance Use Topics  . Smoking status: Never Smoker  . Smokeless tobacco: Never Used  . Alcohol use No    Review of Systems Patient denies headaches, rhinorrhea, blurry vision, numbness, shortness of breath, chest pain, edema, cough, abdominal pain, nausea, vomiting, diarrhea, dysuria, fevers, rashes or hallucinations unless otherwise stated above in HPI. ____________________________________________   PHYSICAL EXAM:  VITAL SIGNS: Vitals:   08/09/16 1730 08/09/16 1830  BP: 132/76 (!) 150/64  Pulse: (!) 49   Resp: 20 (!) 23  Temp:      Constitutional: Alert, elderly appearing but in no acute distress. Eyes: Conjunctivae are normal. PERRL. EOMI.  Head: Atraumatic. Nose: No congestion/rhinnorhea. Mouth/Throat: Mucous membranes are moist.  Oropharynx non-erythematous. Neck: No stridor. Painless ROM. No cervical spine tenderness to palpation Hematological/Lymphatic/Immunilogical: No cervical lymphadenopathy. Cardiovascular: mildly bradycardic rate, regular rhythm. Grossly normal heart sounds.  Skin tenting, but well perfused Respiratory: Normal respiratory effort.  No retractions. Lungs CTAB. Gastrointestinal: Soft and  nontender. No distention. No abdominal bruits. No CVA tenderness. Genitourinary:  Musculoskeletal: No lower extremity tenderness nor edema.  No joint effusions. Neurologic:  Normal speech and language. No gross focal neurologic deficits are appreciated. No gait instability. Skin:  Skin is warm, dry and intact. No rash noted. Psychiatric: Mood and affect are normal. Speech and behavior are normal.  ____________________________________________   LABS (all labs ordered are listed, but only abnormal results are displayed)  Results for orders placed or performed during the hospital encounter of 08/09/16 (from the past 24 hour(s))  Basic metabolic panel     Status: Abnormal   Collection Time: 08/09/16  2:44 PM  Result Value Ref Range   Sodium 138 135 - 145 mmol/L   Potassium 3.5 3.5 - 5.1 mmol/L   Chloride 103 101 - 111 mmol/L   CO2 31 22 - 32 mmol/L   Glucose, Bld 123 (H) 65 - 99 mg/dL   BUN 18 6 - 20 mg/dL   Creatinine, Ser 5.95 0.44 - 1.00 mg/dL   Calcium 8.6 (L) 8.9 - 10.3 mg/dL   GFR calc non Af Amer >60 >60 mL/min   GFR calc Af Amer >60 >60 mL/min   Anion gap 4 (L) 5 - 15  CBC     Status: Abnormal   Collection Time: 08/09/16  2:44 PM  Result Value Ref Range   WBC 5.2 3.6 - 11.0 K/uL   RBC 3.54 (L) 3.80 - 5.20 MIL/uL   Hemoglobin 12.0 12.0 - 16.0 g/dL   HCT 63.8 (L) 75.6 - 43.3 %   MCV 97.7 80.0 - 100.0 fL   MCH 34.0 26.0 - 34.0 pg   MCHC 34.8 32.0 - 36.0 g/dL   RDW 29.5 (H) 18.8 - 41.6 %   Platelets 149 (L) 150 - 440 K/uL  Urinalysis, Complete w Microscopic     Status: Abnormal   Collection Time: 08/09/16  2:44 PM  Result Value Ref Range   Color, Urine YELLOW (A) YELLOW   APPearance CLEAR (A) CLEAR   Specific Gravity, Urine 1.023 1.005 - 1.030   pH 5.0 5.0 - 8.0   Glucose, UA NEGATIVE NEGATIVE mg/dL   Hgb urine dipstick NEGATIVE NEGATIVE   Bilirubin Urine NEGATIVE NEGATIVE   Ketones, ur NEGATIVE NEGATIVE mg/dL   Protein, ur NEGATIVE NEGATIVE mg/dL   Nitrite NEGATIVE  NEGATIVE   Leukocytes, UA NEGATIVE NEGATIVE   RBC / HPF 0-5 0 - 5 RBC/hpf   WBC, UA 0-5 0 - 5 WBC/hpf   Bacteria, UA NONE SEEN NONE SEEN   Squamous Epithelial / LPF 0-5 (A) NONE SEEN   Mucous PRESENT    ____________________________________________  EKG My review and personal interpretation at Time: 14:48   Indication: weakness  Rate: 50  Rhythm: sinus Axis: normal Other: normal, no acute ischemic changes, no STEMI ____________________________________________  RADIOLOGY  I personally reviewed all radiographic images ordered to evaluate for the above acute complaints and reviewed radiology reports and findings.  These findings were personally discussed with the patient.  Please see medical record for radiology report. ____________________________________________   PROCEDURES  Procedure(s) performed:  Procedures    Critical Care performed: no ____________________________________________  INITIAL IMPRESSION / ASSESSMENT AND PLAN / ED COURSE  Pertinent labs & imaging results that were available during my care of the patient were reviewed by me and considered in my medical decision making (see chart for details).  DDX: Dehydration, sepsis, pna, uti, hypoglycemia, cva, drug effect, withdrawal, encephalitis  Kelly Wright is a 83 y.o. who presents to the ED with above complaints.Patient is AFVSS in ED. Exam as above. Given current presentation have considered the above differential.  Patient relatively well appearing and nontoxic but does appear dehydrated. Her abdominal exam is soft and benign. Blood work is reassuring. A sinus crit and he suspect that her symptoms and lightheadedness this is related to dehydration. Will order CT imaging of her head to evaluate for traumatic injury.   Clinical Course as of Aug 09 1938  Wynelle Link Aug 09, 2016  1478 Patient with negative orthostatic vital signs. She is able stand with no unsteadiness. Does have a mildly unsteady gait the patient  typically walks with a cane. I do believe the patient would benefit from using a walker assistive device. Discussing with agents family and caregiver will also order home health physical therapy.  [PR]    Clinical Course User Index [PR] Willy Eddy, MD   ____________________________________________   FINAL CLINICAL IMPRESSION(S) / ED DIAGNOSES  Final diagnoses:  Dehydration  Weakness      NEW MEDICATIONS STARTED DURING THIS VISIT:  New Prescriptions   No medications on file     Note:  This document was prepared using Dragon voice recognition software and may include unintentional dictation errors.    Willy Eddy, MD 08/09/16 [phone]

## 2016-08-09 NOTE — ED Triage Notes (Signed)
Pt comes into the ED via POV c/o increased weakness and dizziness in the past couple of days.  Patient's caregivers states that she has had diarrhea that past couple of days.  Patient denies any shortness of breath, fevers, chest pain, N/V.  Patient in NAD at this time with even and unlabored respirations at this time.

## 2016-11-17 ENCOUNTER — Ambulatory Visit: Payer: Medicare Other | Attending: Family Medicine

## 2016-11-17 VITALS — BP 118/52 | HR 58

## 2016-11-17 DIAGNOSIS — R2681 Unsteadiness on feet: Secondary | ICD-10-CM | POA: Insufficient documentation

## 2016-11-17 DIAGNOSIS — Z9181 History of falling: Secondary | ICD-10-CM | POA: Diagnosis present

## 2016-11-17 DIAGNOSIS — R262 Difficulty in walking, not elsewhere classified: Secondary | ICD-10-CM | POA: Insufficient documentation

## 2016-11-17 NOTE — Therapy (Signed)
[facility]ICINE [address] [place], [address] Phone: [phone]   Fax:  [phone]  Physical Therapy Evaluation  Patient Details  Name: Kelly Wright MRN: [id number] Date of Birth: 1933/06/01 Referring Provider: Darreld Mclean, MD  Encounter Date: 11/17/2016      PT End of Session - 11/17/16 1303    Visit Number 1   Number of Visits 13   Date for PT Re-Evaluation 01/01/17   Authorization Type 1   Authorization Time Period of 10 G-code   PT Start Time 1303   PT Stop Time 1403   PT Time Calculation (min) 60 min   Equipment Utilized During Treatment Gait belt  SPC and rw   Activity Tolerance Patient tolerated treatment well   Behavior During Therapy [facility] for tasks assessed/performed      Past Medical History:  Diagnosis Date  . Arthritis   . Atrial fibrillation (HCC)   . Cerebral vascular accident (HCC)   . DVT (deep venous thrombosis) (HCC)   . Dysrhythmia   . GERD (gastroesophageal reflux disease)   . Interstitial lung disease (HCC)   . Osteoporosis     Past Surgical History:  Procedure Laterality Date  . ABDOMINAL HYSTERECTOMY    . BILATERAL CARPAL TUNNEL RELEASE    . EYE SURGERY      Vitals:   11/17/16 1306 11/17/16 1325  BP: 122/61 (!) 118/52  Pulse: (!) 58          Subjective Assessment - 11/17/16 1306    Pertinent History Gait and strengthening. Pt caregiver Boneta Lucks) states that pt fell yesterday onto her hands and hurt her L wrist. Her children looked at it last night and felt like she did not need to see a doctor about it because pt was able to move it.   Pt has dementia and used to walk with a SPC but is getting worse.  Pt tends to shuffle her feet and tends to stumble on them. Had a few falls for the past year. Last year, pt fell and broke her collar bone and a few ribs. Nothing major for the past 6 months. Pt has a 24 hour caregiver. Pt is never left alone.  Pt currently uses a rw.  Pt walks short distances with the walker or SPC. Only uses her wc when out of the house. Pt able to walk about 30 ft without her legs starting to buckle using a SPC.  Able to walk 50 ft with the rw.    Patient Stated Goals Get around better.    Currently in Pain? Other (Comment)  L wrist pain from fall, caregiver and family do not think she broke it.    Pain Score --  9/10 L wrist pain per caregiver   Pain Type Acute pain  L wrist   Pain Onset Yesterday   Pain Frequency Constant   Aggravating Factors  L wrist pain from fall yesterday. Otherwise, no complain of pain. Movement, pushing with L hand   Pain Relieving Factors rest             [facility] PT Assessment - 11/17/16 1316      Assessment   Medical Diagnosis Malaise and fatigue, osteoporosis, rheumatoid arthritis. Reason for referral: strengthening and gait   Referring Provider Darreld Mclean, MD   Onset Date/Surgical Date 10/22/16  Date MD order signed, chronic condition   Prior Therapy No known PT for current condition     Precautions  Precaution Comments fall risk, osteoporosis     Restrictions   Other Position/Activity Restrictions supervision due to fall risk  and  dementia     Balance Screen   Has the patient fallen in the past 6 months Yes   How many times? 1  yesterday, please see pertinent history section   Has the patient had a decrease in activity level because of a fear of falling?  Yes   Is the patient reluctant to leave their home because of a fear of falling?  Yes     Home Environment   Additional Comments Pt has a 24 hour caregiver, and is never left alone.      Prior Function   Vocation Requirements PLOF: better able to ambulate longer distances with her SPC or walker     Observation/Other Assessments   Observations Bruising L distal radius at ventral surface. Pt able to perform wrist movemnts but with discomfort. Pt and caregiver were recommended to contact her MD if it gets worse. Difficulty following  directions and motor planning.  Difficulty with sit <> stand, stand pivot transfers. Tendency to lean back when performing transfers or walking   Lower Extremity Functional Scale  12/80     Posture/Postural Control   Posture Comments kyphosis, forward flexed, R foot in front, slight R knee flexion.      Strength   Right Hip Flexion 4/5   Right Hip Extension 4+/5  seated manually resisted leg press   Right Hip ABduction 4+/5  seated clamshell position, hips less than 90 degrees flexion   Left Hip Flexion 4-/5   Left Hip Extension 4/5  seated manually resisted leg press   Left Hip ABduction 4+/5  seated clamshell position, hips less than 90 degrees flexion   Right Knee Flexion 4/5   Right Knee Extension 5/5   Left Knee Flexion 4-/5   Left Knee Extension 5/5     Ambulation/Gait   Gait Comments rw: unsteady, backwards lean, decreased step length, decreased hip flexion, cues needed to keep rw close, unsafe.  Gait with SPC on R secondary to L wrist pain:  unsteady, bilateral LE buckling after about 20 ft.     Objectives  There-ex  Stand pivot transfer from wc to mat table using RW, mod A with max cues for technique (hand placement, staying close to rw, stepping to turn, proper hand placement, not using her L UE secondary to wrist discomfort).   Mod assist sit <> stand, max cues for proper technique (hand placement, scooting forward, bringing her center of gravity over her base of support, not using her L UE secondary to wrist discomfort)  Mod assist Seated manually resisted hip extension 10x each LE. Max cues for technique.   Pt was recommended to only stand up if there is someone such as her caregiver is there to help her for safety.     Improved exercise and activity technique, movement at target joints, use of target muscles after max verbal, visual, tactile cues.      Pt tendency for backwards lean with gait, and sit <> stand transfers. Difficulty with motor planing with  transfers (sit <> stand and stand pivot) in which dementia may play a factor.     Pt was recommended to continue PT at the Main clinic at the hospital secondary to the clinic there being better equipped (parallel bars, platform walker for L wrist, tech to help with wc for safety, etc) to help her based on her current condition.  Pt and caregiver verbalized understanding.                 PT Education - 2016/11/28 2003    Education provided Yes   Education Details ther-ex, plan of care   Person(s) Educated Patient;Caregiver(s)   Methods Explanation;Demonstration;Tactile cues;Verbal cues   Comprehension Verbalized understanding;Returned demonstration;Verbal cues required;Tactile cues required;Need further instruction             PT Long Term Goals - Nov 28, 2016 1437      PT LONG TERM GOAL #1   Title Patient will be able to ambulate up to 150 ft with SPC with SBA and no LOB or knee buckling to promote mobility.   Baseline Able to ambulate with SPC on R with min A +1 (second person pushing a wc) x 20 ft (11/28/2016)   Time 6   Period Weeks   Status New     PT LONG TERM GOAL #2   Title Patient will be able to perform sit <> stand from a regular chair with arms or her wc of the same height with bilateral UE assist with SBA to promote ability to perform transfers at home.    Baseline Mod A (11-28-2016)   Time 6   Period Weeks   Status New     PT LONG TERM GOAL #3   Title Pt will be able to perform stand pivot transfers from a surface of a regular chair height with bilateral UE assist SBA and no AD to promote ability to perform transfers at home.    Baseline Mod A with RW (2016-11-28)   Time 6   Period Weeks   Status New     PT LONG TERM GOAL #4   Title Patient will improve bilateral LE strength by at least 1/2 MMT grade to promote ability to perform transfers at home.    Time 6   Period Weeks   Status New               Plan - 11-28-2016 1417    Clinical Impression  Statement Patient is an 83 year old female who came to physical therapy secondary to weakness, and difficulty walking. She presents with bilateral LE weakness, altered gait pattern (unsteady and unsafe), difficulty with motor planning, fall risk, and difficulty performing functional tasks such as transfers, gait, and standing activities. Patient will benefit from skilled physical therapy services to address the aforementioned deficits.    Rehab Potential Fair   Clinical Impairments Affecting Rehab Potential dementia   PT Frequency 2x / week   PT Duration 6 weeks   PT Treatment/Interventions Patient/family education;Therapeutic activities;Therapeutic exercise;Balance training;Neuromuscular re-education;Functional mobility training;Stair training;Gait training;DME Instruction;Electrical Stimulation;Manual techniques   PT Next Visit Plan LE strengthening, transfer training, gait, balance   Consulted and Agree with Plan of Care Family member/caregiver;Patient   Family Member Consulted Boneta Lucks, one of her 24 hour caregivers      Patient will benefit from skilled therapeutic intervention in order to improve the following deficits and impairments:  Abnormal gait, Decreased balance, Decreased cognition, Decreased knowledge of precautions, Decreased knowledge of use of DME, Decreased safety awareness, Decreased strength, Difficulty walking, Improper body mechanics  Visit Diagnosis: Unsteadiness on feet - Plan: PT plan of care cert/re-cert  Difficulty in walking, not elsewhere classified - Plan: PT plan of care cert/re-cert  History of falling - Plan: PT plan of care cert/re-cert      G-Codes - 11/28/2016 1453    Functional Assessment Tool Used (Outpatient Only)  LEFS, clinical presentation, patient interview   Functional Limitation Mobility: Walking and moving around   Mobility: Walking and Moving Around Current Status [phone]) At least 80 percent but less than 100 percent impaired, limited or restricted    Mobility: Walking and Moving Around Goal Status [phone]) At least 60 percent but less than 80 percent impaired, limited or restricted       Problem List Patient Active Problem List   Diagnosis Date Noted  . Infection of urinary tract 02/21/2016  . Dementia 02/21/2016  . Generalized weakness 02/21/2016  . Slurred speech 02/20/2016  . Depression 02/20/2016  . Broken ribs 08/11/2015  . UTI (lower urinary tract infection) 11/12/2014  . Encephalopathy, metabolic 11/12/2014  . Rheumatoid arthritis (HCC) 11/12/2014  . Chronic anticoagulation 11/12/2014  . Atrial fibrillation (HCC) 11/12/2014  . H/O: CVA (cerebrovascular accident) 11/12/2014    Loralyn Freshwater PT, DPT   11/17/2016, 8:20 PM  [facility] PHYSICAL AND SPORTS MEDICINE [address] [place], [address] Phone: [phone]   Fax:  [phone]  Name: Kelly Wright MRN: [id number] Date of Birth: Oct 27, 1932

## 2016-11-24 ENCOUNTER — Ambulatory Visit: Payer: Medicare Other | Admitting: Physical Therapy

## 2016-11-26 ENCOUNTER — Ambulatory Visit: Payer: Medicare Other | Admitting: Physical Therapy

## 2016-11-26 ENCOUNTER — Encounter: Payer: Self-pay | Admitting: Physical Therapy

## 2016-11-26 DIAGNOSIS — R2681 Unsteadiness on feet: Secondary | ICD-10-CM | POA: Diagnosis not present

## 2016-11-26 DIAGNOSIS — R262 Difficulty in walking, not elsewhere classified: Secondary | ICD-10-CM

## 2016-11-26 DIAGNOSIS — Z9181 History of falling: Secondary | ICD-10-CM

## 2016-11-26 NOTE — Therapy (Signed)
[facility] MAIN [facility] SERVICES [address] [place], [place], [address] Phone: [phone]   Fax:  [phone]  Physical Therapy Treatment  Patient Details  Name: Kelly Wright MRN: [id number] Date of Birth: 08/03/1932 Referring Provider: Darreld Mclean, MD  Encounter Date: 11/26/2016      PT End of Session - 11/26/16 1133    Visit Number 2   Number of Visits 13   Date for PT Re-Evaluation 01/01/17   Authorization Type 2/10   Authorization Time Period of 10 G-code   PT Start Time 1122   PT Stop Time 1200   PT Time Calculation (min) 38 min   Equipment Utilized During Treatment Gait belt  SPC and rw   Activity Tolerance Patient tolerated treatment well   Behavior During Therapy WFL for tasks assessed/performed      Past Medical History:  Diagnosis Date  . Arthritis   . Atrial fibrillation (HCC)   . Cerebral vascular accident (HCC)   . DVT (deep venous thrombosis) (HCC)   . Dysrhythmia   . GERD (gastroesophageal reflux disease)   . Interstitial lung disease (HCC)   . Osteoporosis     Past Surgical History:  Procedure Laterality Date  . ABDOMINAL HYSTERECTOMY    . BILATERAL CARPAL TUNNEL RELEASE    . EYE SURGERY      There were no vitals filed for this visit.      Subjective Assessment - 11/26/16 1131    Subjective Patient reports that she is doing fine today and nothing is hurting her.    Pertinent History Gait and strengthening. Pt caregiver Boneta Lucks) states that pt fell yesterday onto her hands and hurt her L wrist. Her children looked at it last night and felt like she did not need to see a doctor about it because pt was able to move it.   Pt has dementia and used to walk with a SPC but is getting worse.  Pt tends to shuffle her feet and tends to stumble on them. Had a few falls for the past year. Last year, pt fell and broke her collar bone and a few ribs. Nothing major for the past 6 months. Pt has a 24 hour caregiver. Pt is  never left alone.  Pt currently uses a rw. Pt walks short distances with the walker or SPC. Only uses her wc when out of the house. Pt able to walk about 30 ft without her legs starting to buckle using a SPC.  Able to walk 50 ft with the rw.    Patient Stated Goals Get around better.    Currently in Pain? No/denies   Pain Score 0-No pain   Multiple Pain Sites No      THER-EX BLE Standing exercises:: Marching  x 15;cues for posture and correct technique, and to reach LE higher Hip flex x 15, cues to stand errect and keep LE straight Abduction x 15; cues not to swing her leg but control it. Extension x 15; cues to stand straight  Resisted side-steeping YTB 4 lengths x 2;, cues for posture correction Standing mini squats 2 x 10 , cues for technique Sit to stand with UE support 2 x 10; and cues to stay on task Step-ups to 6" step x 10 bilateral; with cues for staying on task  Leg press 45 lbs x 20 x 2 Gait training with rollator and 150 feet x 4 with CGA ;  NEUROMUSCULAR RE-EDUCATION Floor NBOS eyes open x 30  seconds x 5 Feet together  X 30 sec x 5    Patient needs constant cues to stay on task and complete the exercises. She does not report any discomfort or pain. She takes one rest period                           PT Education - 11/26/16 1133    Education provided Yes   Education Details plan of care   Person(s) Educated Patient   Methods Explanation   Comprehension Verbalized understanding             PT Long Term Goals - 11/17/16 1437      PT LONG TERM GOAL #1   Title Patient will be able to ambulate up to 150 ft with SPC with SBA and no LOB or knee buckling to promote mobility.   Baseline Able to ambulate with SPC on R with min A +1 (second person pushing a wc) x 20 ft (11/17/2016)   Time 6   Period Weeks   Status New     PT LONG TERM GOAL #2   Title Patient will be able to perform sit <> stand from a regular chair with arms or her wc of the  same height with bilateral UE assist with SBA to promote ability to perform transfers at home.    Baseline Mod A (11/17/2016)   Time 6   Period Weeks   Status New     PT LONG TERM GOAL #3   Title Pt will be able to perform stand pivot transfers from a surface of a regular chair height with bilateral UE assist SBA and no AD to promote ability to perform transfers at home.    Baseline Mod A with RW (11/17/2016)   Time 6   Period Weeks   Status New     PT LONG TERM GOAL #4   Title Patient will improve bilateral LE strength by at least 1/2 MMT grade to promote ability to perform transfers at home.    Time 6   Period Weeks   Status New               Plan - 11/26/16 1134    Clinical Impression Statement Patient is  instructed in gait training on level surfaces for 150 feet x 4 with rollator and 1  rest period standing. She is able to perform beginning LE exercises without pain behaviors and  throughout session. Patient will continue to benefit from skilled PT to improve strength, balance and gait. She continues to have LE weakness, and decreased static and dynamic standing balance.   Rehab Potential Fair   Clinical Impairments Affecting Rehab Potential dementia   PT Frequency 2x / week   PT Duration 6 weeks   PT Treatment/Interventions Patient/family education;Therapeutic activities;Therapeutic exercise;Balance training;Neuromuscular re-education;Functional mobility training;Stair training;Gait training;DME Instruction;Electrical Stimulation;Manual techniques   PT Next Visit Plan LE strengthening, transfer training, gait, balance   Consulted and Agree with Plan of Care Family member/caregiver;Patient   Family Member Consulted Boneta Lucks, one of her 24 hour caregivers      Patient will benefit from skilled therapeutic intervention in order to improve the following deficits and impairments:  Abnormal gait, Decreased balance, Decreased cognition, Decreased knowledge of precautions, Decreased  knowledge of use of DME, Decreased safety awareness, Decreased strength, Difficulty walking, Improper body mechanics  Visit Diagnosis: Difficulty in walking, not elsewhere classified  Unsteadiness on feet  History of falling  Problem List Patient Active Problem List   Diagnosis Date Noted  . Infection of urinary tract 02/21/2016  . Dementia 02/21/2016  . Generalized weakness 02/21/2016  . Slurred speech 02/20/2016  . Depression 02/20/2016  . Broken ribs 08/11/2015  . UTI (lower urinary tract infection) 11/12/2014  . Encephalopathy, metabolic 11/12/2014  . Rheumatoid arthritis (HCC) 11/12/2014  . Chronic anticoagulation 11/12/2014  . Atrial fibrillation (HCC) 11/12/2014  . H/O: CVA (cerebrovascular accident) 11/12/2014   Ezekiel Ina, PT, DPT Jones Broom S 11/26/2016, 12:04 PM  [facility] MAIN [facility] SERVICES [address] [place], [place], [address] Phone: [phone]   Fax:  [phone]  Name: ZEINA TALAMANTES MRN: [id number] Date of Birth: 11/27/1932

## 2016-12-01 ENCOUNTER — Ambulatory Visit: Payer: Medicare Other | Admitting: Physical Therapy

## 2016-12-03 ENCOUNTER — Ambulatory Visit: Payer: Medicare Other

## 2016-12-03 VITALS — BP 102/48 | HR 65

## 2016-12-03 DIAGNOSIS — R262 Difficulty in walking, not elsewhere classified: Secondary | ICD-10-CM

## 2016-12-03 DIAGNOSIS — R2681 Unsteadiness on feet: Secondary | ICD-10-CM

## 2016-12-03 DIAGNOSIS — Z9181 History of falling: Secondary | ICD-10-CM

## 2016-12-03 NOTE — Therapy (Signed)
[facility] MAIN [facility] SERVICES [address] [place], [place], [address] Phone: [phone]   Fax:  [phone]  Physical Therapy Treatment  Patient Details  Name: Kelly Wright MRN: [id number] Date of Birth: Sep 17, 1932 Referring Provider: Darreld Mclean, MD  Encounter Date: 12/03/2016      PT End of Session - 12/03/16 1524    Visit Number 3   Number of Visits 13   Date for PT Re-Evaluation 01/01/17   Authorization Type 3/10   Authorization Time Period of 10 G-code   PT Start Time 1526   PT Stop Time 1605   PT Time Calculation (min) 39 min   Equipment Utilized During Treatment Gait belt  SPC and rw   Activity Tolerance Patient tolerated treatment well;Patient limited by pain;Patient limited by fatigue   Behavior During Therapy [facility] for tasks assessed/performed;Restless      Past Medical History:  Diagnosis Date  . Arthritis   . Atrial fibrillation (HCC)   . Cerebral vascular accident (HCC)   . DVT (deep venous thrombosis) (HCC)   . Dysrhythmia   . GERD (gastroesophageal reflux disease)   . Interstitial lung disease (HCC)   . Osteoporosis     Past Surgical History:  Procedure Laterality Date  . ABDOMINAL HYSTERECTOMY    . BILATERAL CARPAL TUNNEL RELEASE    . EYE SURGERY      Vitals:   12/03/16 1530  BP: (!) 102/48  Pulse: 65  SpO2: 99%        Subjective Assessment - 12/03/16 1524    Subjective Patient caregiver reports that pt. had one near fall and had to be caught prior to hitting floor since last session. Pt. caregiver reports frequent LOB daily especially with transfers. Constant pain noted in L arm.    Pertinent History Gait and strengthening. Pt caregiver Boneta Lucks) states that pt fell yesterday onto her hands and hurt her L wrist. Her children looked at it last night and felt like she did not need to see a doctor about it because pt was able to move it.   Pt has dementia and used to walk with a SPC but is getting worse.   Pt tends to shuffle her feet and tends to stumble on them. Had a few falls for the past year. Last year, pt fell and broke her collar bone and a few ribs. Nothing major for the past 6 months. Pt has a 24 hour caregiver. Pt is never left alone.  Pt currently uses a rw. Pt walks short distances with the walker or SPC. Only uses her wc when out of the house. Pt able to walk about 30 ft without her legs starting to buckle using a SPC.  Able to walk 50 ft with the rw.    Patient Stated Goals Get around better.    Currently in Pain? Yes   Pain Score 5    Pain Location Arm   Pain Orientation Left   Pain Descriptors / Indicators Aching;Constant   Pain Onset More than a month ago   Pain Frequency Constant    Neuro In // bars: kick rainbow ball x10 with more difficulty L>R.[name] Patient required frequent passes before being able to lift leg to kick ball. In // bars: balloon pass x2 minutes: CGA. Frequent cues not to utilize UE support In // bars: ball pass with PT within and outside BOS. 1 min normal stance, 2 min NBOS Standing marches, L SLS > R SLS x 20  TherEx: Sit to stand with platform walker from plinth table while playing tic tac toe (one mark per stand). X 5 Frequent cues for body mechanics, hand placement, task orientation Sit to stand from plinth table (PT holding L arm to prevent WB) to play tic tac toe (one mark per stand) x 5 Green TB: hip abduction 2x12 "high five my hand with your knee"                   Knee flexion 1x12 Ambulating 40 ft with platform walker and cues for gait mechanics, required visual cues for foot placement. Able to alter speeds.   Pt. response to medical necessity:. Patient will benefit from skilled physical therapy to improve balance, LE strength, improve gait mechanics and safety in ambulation and transfers to allow for safe interaction with environment.       PT Long Term Goals - 11/17/16 1437      PT LONG TERM GOAL #1   Title Patient will be able to ambulate up  to 150 ft with SPC with SBA and no LOB or knee buckling to promote mobility.   Baseline Able to ambulate with SPC on R with min A +1 (second person pushing a wc) x 20 ft (11/17/2016)   Time 6   Period Weeks   Status New     PT LONG TERM GOAL #2   Title Patient will be able to perform sit <> stand from a regular chair with arms or her wc of the same height with bilateral UE assist with SBA to promote ability to perform transfers at home.    Baseline Mod A (11/17/2016)   Time 6   Period Weeks   Status New     PT LONG TERM GOAL #3   Title Pt will be able to perform stand pivot transfers from a surface of a regular chair height with bilateral UE assist SBA and no AD to promote ability to perform transfers at home.    Baseline Mod A with RW (11/17/2016)   Time 6   Period Weeks   Status New     PT LONG TERM GOAL #4   Title Patient will improve bilateral LE strength by at least 1/2 MMT grade to promote ability to perform transfers at home.    Time 6   Period Weeks   Status New               Plan - 12/03/16 1628    Clinical Impression Statement Patient required frequent cuing for task orientation due to current mental status. Weight bearing through L UE was avoided due to pain/fx of L radius. Utilization of platform walker was implemented for standing and walking tasks. Ambulating with platform walker had the above noticed deficits: excessive forward lean, decreased step length and speed, decreased hip, knee and ankle mobility. Sit to stands required constant verbal cueing for proper body mechanics, hand placement, and sequencing. Balance was challenged in standing with multitasking and patient demonstrated poor reaction capabilities for return to COM and lifting of LE's. Patient will benefit from skilled physical therapy to improve balance, LE strength, improve gait mechanics and safety in ambulation and transfers to allow for safe interaction with environment.    Rehab Potential Fair    Clinical Impairments Affecting Rehab Potential dementia   PT Frequency 2x / week   PT Duration 6 weeks   PT Treatment/Interventions Patient/family education;Therapeutic activities;Therapeutic exercise;Balance training;Neuromuscular re-education;Functional mobility training;Stair training;Gait training;DME Instruction;Electrical Stimulation;Manual techniques  PT Next Visit Plan LE strengthening, transfer training, gait, balance   Consulted and Agree with Plan of Care Family member/caregiver;Patient   Family Member Consulted Boneta Lucks, one of her 24 hour caregivers      Patient will benefit from skilled therapeutic intervention in order to improve the following deficits and impairments:  Abnormal gait, Decreased balance, Decreased cognition, Decreased knowledge of precautions, Decreased knowledge of use of DME, Decreased safety awareness, Decreased strength, Difficulty walking, Improper body mechanics  Visit Diagnosis: Difficulty in walking, not elsewhere classified  Unsteadiness on feet  History of falling     Problem List Patient Active Problem List   Diagnosis Date Noted  . Infection of urinary tract 02/21/2016  . Dementia 02/21/2016  . Generalized weakness 02/21/2016  . Slurred speech 02/20/2016  . Depression 02/20/2016  . Broken ribs 08/11/2015  . UTI (lower urinary tract infection) 11/12/2014  . Encephalopathy, metabolic 11/12/2014  . Rheumatoid arthritis (HCC) 11/12/2014  . Chronic anticoagulation 11/12/2014  . Atrial fibrillation (HCC) 11/12/2014  . H/O: CVA (cerebrovascular accident) 11/12/2014  Precious Bard, PT, DPT  12/03/2016, 4:42 PM  [facility] MAIN [facility] SERVICES [address] [place], [place], [address] Phone: [phone]   Fax:  [phone]  Name: Kelly Wright MRN: [id number] Date of Birth: Dec 05, 1932

## 2016-12-09 ENCOUNTER — Ambulatory Visit: Payer: Medicare Other | Admitting: Physical Therapy

## 2016-12-14 ENCOUNTER — Ambulatory Visit: Payer: Medicare Other | Admitting: Physical Therapy

## 2016-12-16 ENCOUNTER — Ambulatory Visit: Payer: Medicare Other | Attending: Family Medicine | Admitting: Physical Therapy

## 2016-12-21 ENCOUNTER — Encounter: Payer: Medicare Other | Admitting: Physical Therapy

## 2016-12-23 ENCOUNTER — Encounter: Payer: Medicare Other | Admitting: Physical Therapy

## 2016-12-28 ENCOUNTER — Encounter: Payer: Medicare Other | Admitting: Physical Therapy

## 2016-12-30 ENCOUNTER — Encounter: Payer: Medicare Other | Admitting: Physical Therapy

## 2017-03-04 ENCOUNTER — Ambulatory Visit: Payer: Medicare Other | Attending: Family Medicine

## 2017-03-04 DIAGNOSIS — R2681 Unsteadiness on feet: Secondary | ICD-10-CM

## 2017-03-04 DIAGNOSIS — Z9181 History of falling: Secondary | ICD-10-CM | POA: Insufficient documentation

## 2017-03-04 DIAGNOSIS — R262 Difficulty in walking, not elsewhere classified: Secondary | ICD-10-CM | POA: Diagnosis present

## 2017-03-04 NOTE — Patient Instructions (Signed)
1. Sit to stand 10x 2. Seated marches 20x 3. Standing marches 20x 4. Leg extensions with 5 second holds 10x 5. Static standing balance 2x30 seconds All to be done with CGA of caregiver

## 2017-03-04 NOTE — Therapy (Signed)
[facility] MAIN [facility] SERVICES [address] [place], [place], [address] Phone: [phone]   Fax:  [phone]  Physical Therapy Evaluation  Patient Details  Name: Kelly Wright MRN: [id number] Date of Birth: November 08, 1932 Referring Provider: Dr. Malvin Johns  Encounter Date: 03/04/2017      PT End of Session - 03/04/17 2147    Visit Number 1   Number of Visits 12   Date for PT Re-Evaluation 04/15/17   Authorization - Visit Number 1   Authorization - Number of Visits 10   PT Start Time 1300   PT Stop Time 1358   PT Time Calculation (min) 58 min   Equipment Utilized During Treatment Gait belt   Activity Tolerance Patient tolerated treatment well;Treatment limited secondary to medical complications (Comment);Other (comment)   Behavior During Therapy [facility] for tasks assessed/performed;Impulsive;Restless      Past Medical History:  Diagnosis Date  . Arthritis   . Atrial fibrillation (HCC)   . Cerebral vascular accident (HCC)   . DVT (deep venous thrombosis) (HCC)   . Dysrhythmia   . GERD (gastroesophageal reflux disease)   . Interstitial lung disease (HCC)   . Osteoporosis     Past Surgical History:  Procedure Laterality Date  . ABDOMINAL HYSTERECTOMY    . BILATERAL CARPAL TUNNEL RELEASE    . EYE SURGERY      There were no vitals filed for this visit.       Subjective Assessment - 03/04/17 1306    Subjective Patient is an 83 year old female who presents for imbalance. She is accomanied by her cargiver.    Pertinent History Patient is a pleasant 83 year old female with advancing dementia accompanied by caregiver. Caregiver reports that the daughter took the patient off medications. Medications were quit cold [place] according to caregiver.  Has medication for anxiety, folic acid, aspirin, and another pill. Daughter in law does her medication every week. Was at [facility] two weeks ago. Has good days and bad days. Doesn't walk with walker or  cane. Occasionally 2x/ a week will use cane at night. Fell 3x last week, 1x this week with current caregiver. Patient has a 24/7 caregiver and never left a long. Walks short distances with hand hold assist.    Limitations Lifting;Standing;Walking;House hold activities   How long can you sit comfortably? n/a   How long can you stand comfortably? short durations   How long can you walk comfortably? 50 ft   Patient Stated Goals to be more independent, do what I want to.    Currently in Pain? No/denies            [facility] PT Assessment - 03/04/17 0001      Assessment   Medical Diagnosis imbalance and gait   Referring Provider Dr. Malvin Johns   Onset Date/Surgical Date 10/22/16   Hand Dominance Right   Next MD Visit [facility]th, [facility]   Prior Therapy yes     Precautions   Precautions Fall   Precaution Comments fall risk, osteoporosis     Restrictions   Weight Bearing Restrictions No   Other Position/Activity Restrictions supervision due to fall risk  and  dementia     Balance Screen   Has the patient fallen in the past 6 months Yes   How many times? 4+   Has the patient had a decrease in activity level because of a fear of falling?  Yes   Is the patient reluctant to leave their  home because of a fear of falling?  Yes     Home Environment   Living Environment Private residence   Living Arrangements Other (Comment)  caregiver, two girls that switch out every other week every    Available Help at Discharge Personal care attendant;Available 24 hours/day   Type of Home House   Home Access Ramped entrance   Home Layout One level  2 steps inside    Home Equipment Walker - 4 wheels;Walker - standard;Wheelchair - manual;Grab bars - tub/shower;Shower seat   Additional Comments Pt has a 24 hour caregiver, and is never left alone.      Prior Function   Level of Independence Needs assistance with homemaking;Needs assistance with ADLs;Needs assistance with gait;Needs assistance with  transfers   Vocation Retired   [employer] PLOF: better able to ambulate longer distances with her SPC or walker     Cognition   Overall Cognitive Status History of cognitive impairments - at baseline   Memory Impaired   Memory Impairment Decreased short term memory;Decreased long term memory;Decreased recall of new information;Retrieval deficit;Storage deficit   Awareness Impaired   Awareness Impairment --  dementia   Problem Solving Impaired     Sensation   Light Touch Appears Intact     Posture/Postural Control   Posture/Postural Control Postural limitations   Postural Limitations Rounded Shoulders;Forward head;Flexed trunk;Anterior pelvic tilt   Posture Comments kyphosis, forward flexed, R foot in front, slight R knee flexion.      Transfers   Transfers Sit to Stand;Stand to [employer] Transfers   Sit to Stand 4: Min guard;Uncontrolled descent   Stand to Sit Uncontrolled descent;4: Min guard   Stand Pivot Transfers 4: Min assist   Stand Pivot Transfer Details (indicate cue type and reason) LOB, requires constant verbal cueing      Ambulation/Gait   Ambulation/Gait Yes   Ambulation/Gait Assistance 4: Min assist   Assistive device 1 person hand held assist   Gait Pattern Decreased arm swing - left;Decreased arm swing - right;Decreased stride length;Decreased dorsiflexion - right;Decreased dorsiflexion - left;Shuffle;Trunk flexed;Narrow base of support;Poor foot clearance - left;Poor foot clearance - right     Balance   Balance Assessed Yes     Standardized Balance Assessment   Standardized Balance Assessment Berg Balance Test     Berg Balance Test   Sit to Stand Able to stand using hands after several tries   Standing Unsupported Able to stand 2 minutes with supervision   Sitting with Back Unsupported but Feet Supported on Floor or Stool Able to sit safely and securely 2 minutes   Stand to Sit Sits independently, has uncontrolled descent   Transfers Needs  one person to assist   Standing Unsupported with Eyes Closed Needs help to keep from falling   Standing Ubsupported with Feet Together Needs help to attain position and unable to hold for 15 seconds   From Standing, Reach Forward with Outstretched Arm Reaches forward but needs supervision   From Standing Position, Pick up Object from Floor Unable to pick up and needs supervision   From Standing Position, Turn to Look Behind Over each Shoulder Looks behind from both sides and weight shifts well   Turn 360 Degrees Needs assistance while turning   Standing Unsupported, Alternately Place Feet on Step/Stool Needs assistance to keep from falling or unable to try   Standing Unsupported, One Foot in [employer] balance while stepping or standing   Standing on One Leg Unable to try  or needs assist to prevent fall   Total Score 17         PAIN:  Worse 4/10  PROM/AROM: Hip flexors tight due to positioning  STRENGTH:  Graded on a 0-5 scale Muscle Group Left Right  Shoulder flex    Shoulder Abd    Shoulder Ext    Shoulder IR/ER    Elbow    Wrist/hand    Hip Flex 4/5 4-/5  Hip Abd 4/5 4-/5  Hip Add 4-/5 4-5  Hip Ext 4/5 4-5  Hip IR/ER 4/5 4-/5  Knee Flex 4/5 4/5  Knee Ext 4-/5 4-/5  Ankle DF 4-/5 4-/5  Ankle PF 4-/5 4-/5   SENSATION:  Seems intact-difficult to assess due to patient metnal   SPECIAL TESTS:   FUNCTIONAL MOBILITY:   BALANCE: Dynamic Sitting Balance  Normal Able to sit unsupported and weight shift across midline maximally   Good Able to sit unsupported and weight shift across midline moderately   Good-/Fair+ Able to sit unsupported and weight shift across midline minimally x  Fair Minimal weight shifting ipsilateral/front, difficulty crossing midline   Fair- Reach to ipsilateral side and unable to weight shift   Poor + Able to sit unsupported with min A and reach to ipsilateral side, unable to weight shift   Poor Able to sit unsupported with mod A and reach  ipsilateral/front-can't cross midline     Standing Dynamic Balance  Normal Stand independently unsupported, able to weight shift and cross midline maximally   Good Stand independently unsupported, able to weight shift and cross midline moderately   Good-/Fair+ Stand independently unsupported, able to weight shift across midline minimally   Fair Stand independently unsupported, weight shift, and reach ipsilaterally, loss of balance when crossing midline   Poor+ Able to stand with Min A and reach ipsilaterally, unable to weight shift x  Poor Able to stand with Mod A and minimally reach ipsilaterally, unable to cross midline.     Static Sitting Balance  Normal Able to maintain balance against maximal resistance   Good Able to maintain balance against moderate resistance   Good-/Fair+ Accepts minimal resistance   Fair Able to sit unsupported without balance loss and without UE support x  Poor+ Able to maintain with Minimal assistance from individual or chair   Poor Unable to maintain balance-requires mod/max support from individual or chair     Static Standing Balance  Normal Able to maintain standing balance against maximal resistance   Good Able to maintain standing balance against moderate resistance   Good-/Fair+ Able to maintain standing balance against minimal resistance   Fair Able to stand unsupported without UE support and without LOB for 1-2 min x  Fair- Requires Min A and UE support to maintain standing without loss of balance   Poor+ Requires mod A and UE support to maintain standing without loss of balance   Poor Requires max A and UE support to maintain standing balance without loss       GAIT: Caregiver reports patient has a walker and cane but does not utilize them. Ambulated with hand hold assist as that is how patient ambulates at home.  OUTCOME MEASURES: TEST Outcome Interpretation  5 times sit<>stand 26sec >81 yo, >15 sec indicates increased risk for falls  10  meter walk test             57 second   =.17 m/s <1.0 m/s indicates increased risk for falls; limited community ambulator  ABC=17.5% 17.5% Low  level of physical functioning  DHI= 72% Sever perceived handicap  Berg Balance Assessment 17/56  <36/56 (100% risk for falls), 37-45 (80% risk for falls); 46-51 (>50% risk for falls); 52-55 (lower risk <25% of falls)       1. Sit to stand 10x 2. Seated marches 20x 3. Standing marches 20x 4. Leg extensions with 5 second holds 10x 5. Static standing balance 2x30 seconds          Objective measurements completed on examination: See above findings.                  PT Education - 03/04/17 2147    Education provided Yes   Education Details HEP   Person(s) Educated Patient;Caregiver(s)   Methods Explanation;Demonstration;Handout;Verbal cues;Tactile cues   Comprehension Verbal cues required;Tactile cues required;Need further instruction          PT Short Term Goals - 03/04/17 2154      PT SHORT TERM GOAL #1   Title Patient will static stand for 2 minutes without LOB to improve ability to assist/perform ADLs   Baseline 8/23: unable to static stand for >1 minute   Time 2   Period Weeks   Status New   Target Date 03/18/17     PT SHORT TERM GOAL #2   Title Patient will ambulate with CGA 20 ft without LOB or stopping to improve mobility within home.    Baseline ambulate 10 ft with handhold assist and frequent misteppings and stops   Time 2   Period Weeks   Status New           PT Long Term Goals - 03/04/17 2156      PT LONG TERM GOAL #1   Title Patient will be able to ambulate up to 150 ft with SPC/ least assistive device with SBA and no LOB or knee buckling to promote mobility.   Baseline hand hold assist 10 ft   Time 6   Period Weeks   Status New     PT LONG TERM GOAL #2   Title Patient will increase Berg Balance score by > 6 points (23/56) to demonstrate decreased fall risk during functional activities.    Baseline 8/23: 17/56   Time 6   Period Weeks   Status New     PT LONG TERM GOAL #3   Title Patient will increase 10 meter walk test to >.71m/s as to improve gait speed for better community ambulation and to reduce fall risk.   Baseline 8/23: .30m/s   Time 6   Period Weeks   Status New     PT LONG TERM GOAL #4   Title Patient (> 33 years old) will complete five times sit to stand test in < 15 seconds indicating an increased LE strength and improved balance.   Baseline 8/23: 26 seconds   Time 6   Period Weeks   Status New     PT LONG TERM GOAL #5   Title Patient will deny any falls over past 4 weeks to demonstrate improved safety awareness at home and work.    Baseline Frequent falls   Time 6   Period Weeks   Status New                Plan - 03/04/17 2149    Clinical Impression Statement Patient is a pleasant 83 year old female who presents to physical therapy for imbalance. She has seen physical therapy earlier this year but stopped attending due  to personal matters. Patient has dementia and has good days and bad days, requiring frequent cueing and constant CGA due to lack of awareness of deficits. 5x STS=26 seconds, 10MWT=/14m/s with hand hold assist, ABC=17.5%, DHI=72%, BERG=17/56. Test results affected by patient's limited mental capacity. Patient is a high fall risk and would benefit from skilled physical therapy to decrease fall risk and improve quality of life.    History and Personal Factors relevant to plan of care: This patient presents with 3, personal factors/ comorbidities and, 4  body elements including body structures and functions, activity limitations and or participation restrictions. Patient's condition is unstable   Clinical Presentation Unstable   Clinical Presentation due to: dementia : good days and bad days, unaware of deficits,    Clinical Decision Making High   Rehab Potential Fair   Clinical Impairments Affecting Rehab Potential dementia, hx of  stroke, osteoporosis, altered mental status, hx of falls   PT Frequency 2x / week   PT Duration 6 weeks   PT Treatment/Interventions Patient/family education;Therapeutic activities;Therapeutic exercise;Balance training;Neuromuscular re-education;Functional mobility training;Stair training;Gait training;DME Instruction;Electrical Stimulation;Manual techniques;ADLs/Self Care Home Management;Cryotherapy;Iontophoresis 4mg /ml Dexamethasone;Moist Heat;Orthotic Fit/Training;Passive range of motion;Energy conservation;Vestibular;Visual/perceptual remediation/compensation   PT Next Visit Plan LE strengthening, transfer training, gait, balance   PT Home Exercise Plan see sheet   Consulted and Agree with Plan of Care Patient;Family member/caregiver   Family Member Consulted caregiver      Patient will benefit from skilled therapeutic intervention in order to improve the following deficits and impairments:  Abnormal gait, Decreased balance, Decreased cognition, Decreased knowledge of precautions, Decreased knowledge of use of DME, Decreased safety awareness, Decreased strength, Difficulty walking, Improper body mechanics, Decreased activity tolerance, Decreased endurance, Decreased range of motion, Dizziness, Impaired perceived functional ability, Impaired flexibility, Postural dysfunction  Visit Diagnosis: Unsteadiness on feet  Difficulty in walking, not elsewhere classified  History of falling      G-Codes - 03/30/17 2200    Functional Assessment Tool Used (Outpatient Only) 5xSTS, , ABC,DHI,BERG, clinical presentation   Functional Limitation Mobility: Walking and moving around   Mobility: Walking and Moving Around Current Status [phone]) At least 80 percent but less than 100 percent impaired, limited or restricted   Mobility: Walking and Moving Around Goal Status [phone]) At least 60 percent but less than 80 percent impaired, limited or restricted       Problem List Patient Active Problem  List   Diagnosis Date Noted  . Infection of urinary tract 02/21/2016  . Dementia 02/21/2016  . Generalized weakness 02/21/2016  . Slurred speech 02/20/2016  . Depression 02/20/2016  . Broken ribs 08/11/2015  . UTI (lower urinary tract infection) 11/12/2014  . Encephalopathy, metabolic 11/12/2014  . Rheumatoid arthritis (HCC) 11/12/2014  . Chronic anticoagulation 11/12/2014  . Atrial fibrillation (HCC) 11/12/2014  . H/O: CVA (cerebrovascular accident) 11/12/2014   Precious Bard, PT, DPT   Precious Bard March 30, 2017, 10:01 PM  [facility] MAIN [facility] SERVICES [address] [place], [place], [address] Phone: [phone]   Fax:  [phone]  Name: Kelly Wright MRN: [id number] Date of Birth: 06-02-1933

## 2017-03-08 ENCOUNTER — Ambulatory Visit: Payer: Medicare Other

## 2017-03-09 ENCOUNTER — Ambulatory Visit: Payer: Medicare Other

## 2017-03-09 DIAGNOSIS — R2681 Unsteadiness on feet: Secondary | ICD-10-CM | POA: Diagnosis not present

## 2017-03-09 DIAGNOSIS — Z9181 History of falling: Secondary | ICD-10-CM

## 2017-03-09 DIAGNOSIS — R262 Difficulty in walking, not elsewhere classified: Secondary | ICD-10-CM

## 2017-03-09 NOTE — Therapy (Signed)
[facility] MAIN [facility] SERVICES [address] [place], [place], [address] Phone: [phone]   Fax:  [phone]  Physical Therapy Treatment  Patient Details  Name: Kelly Wright MRN: [id number] Date of Birth: 1933-07-07 Referring Provider: Dr. Malvin Johns  Encounter Date: 03/09/2017      PT End of Session - 03/09/17 1740    Visit Number 2   Number of Visits 12   Date for PT Re-Evaluation 04/15/17   Authorization - Visit Number 2   Authorization - Number of Visits 10   PT Start Time 1443   PT Stop Time 1515   PT Time Calculation (min) 32 min   Equipment Utilized During Treatment Gait belt   Activity Tolerance Patient tolerated treatment well;Other (comment)   Behavior During Therapy [facility] for tasks assessed/performed;Impulsive;Restless      Past Medical History:  Diagnosis Date  . Arthritis   . Atrial fibrillation (HCC)   . Cerebral vascular accident (HCC)   . DVT (deep venous thrombosis) (HCC)   . Dysrhythmia   . GERD (gastroesophageal reflux disease)   . Interstitial lung disease (HCC)   . Osteoporosis     Past Surgical History:  Procedure Laterality Date  . ABDOMINAL HYSTERECTOMY    . BILATERAL CARPAL TUNNEL RELEASE    . EYE SURGERY      There were no vitals filed for this visit.      Subjective Assessment - 03/09/17 1448    Subjective Patient is accompanied by caregiver Judeth Cornfield. Been compliant with HEP per caregiver report   Pertinent History Patient is a pleasant 83 year old female with advancing dementia accompanied by caregiver. Caregiver reports that the daughter took the patient off medications. Medications were quit cold [place] according to caregiver.  Has medication for anxiety, folic acid, aspirin, and another pill. Daughter in law does her medication every week. Was at [facility] two weeks ago. Has good days and bad days. Doesn't walk with walker or cane. Occasionally 2x/ a week will use cane at night. Fell 3x last week,  1x this week with current caregiver. Patient has a 24/7 caregiver and never left a long. Walks short distances with hand hold assist.    Limitations Lifting;Standing;Walking;House hold activities   How long can you sit comfortably? n/a   How long can you stand comfortably? short durations   How long can you walk comfortably? 50 ft   Patient Stated Goals to be more independent, do what I want to.    Currently in Pain? No/denies      Neuro Re-ed  Standing balance without UE support scanning room x 2 min Airex pad with UE waves. Max cueing for sequencing and not utilizing UE support Standing balance with UE waves not on Airex pad x 10 each arm  Standing balance horizontal head turns x 10 each direction, vertical x 5 each direction  TherEx Standing marches x 10 Side step in // bars 10x each side  Stepping over 2 consecutive hurdles x10 Side stepping over hurdle 10x total, BUE supported Toe taps 6" step 15x to fatigue.   Required seated rest breaks  Patient requires max verbal cueing for task orientation and sequencing.   Walking in // bars   Pt. response to medical necessity:  Patient will benefit from skilled physical therapy to decrease fall risk and improve quality of life.  PT Education - 03/09/17 1506    Education provided Yes   Education Details static balance   Person(s) Educated Patient;Caregiver(s)   Methods Explanation;Demonstration;Verbal cues   Comprehension Need further instruction          PT Short Term Goals - 03/04/17 2154      PT SHORT TERM GOAL #1   Title Patient will static stand for 2 minutes without LOB to improve ability to assist/perform ADLs   Baseline 8/23: unable to static stand for >1 minute   Time 2   Period Weeks   Status New   Target Date 03/18/17     PT SHORT TERM GOAL #2   Title Patient will ambulate with CGA 20 ft without LOB or stopping to improve mobility within home.    Baseline  ambulate 10 ft with handhold assist and frequent misteppings and stops   Time 2   Period Weeks   Status New           PT Long Term Goals - 03/04/17 2156      PT LONG TERM GOAL #1   Title Patient will be able to ambulate up to 150 ft with SPC/ least assistive device with SBA and no LOB or Wright buckling to promote mobility.   Baseline hand hold assist 10 ft   Time 6   Period Weeks   Status New     PT LONG TERM GOAL #2   Title Patient will increase Berg Balance score by > 6 points (23/56) to demonstrate decreased fall risk during functional activities.   Baseline 8/23: 17/56   Time 6   Period Weeks   Status New     PT LONG TERM GOAL #3   Title Patient will increase 10 meter walk test to >.41m/s as to improve gait speed for better community ambulation and to reduce fall risk.   Baseline 8/23: .99m/s   Time 6   Period Weeks   Status New     PT LONG TERM GOAL #4   Title Patient (> 50 years old) will complete five times sit to stand test in < 15 seconds indicating an increased LE strength and improved balance.   Baseline 8/23: 26 seconds   Time 6   Period Weeks   Status New     PT LONG TERM GOAL #5   Title Patient will deny any falls over past 4 weeks to demonstrate improved safety awareness at home and work.    Baseline Frequent falls   Time 6   Period Weeks   Status New               Plan - 03/09/17 1742    Clinical Impression Statement Patient was late to session, limiting treatment duration. Patient requires max verbal cueing for task orientation, frequently forgetting mid task what she was performing. LE weakness limits standing duration and required patient to rest in seated position when fatigued. Posterior sway noted when performing standing balance. Patient will benefit from skilled physical therapy to decrease fall risk and improve quality of life.   Rehab Potential Fair   Clinical Impairments Affecting Rehab Potential dementia, hx of stroke,  osteoporosis, altered mental status, hx of falls   PT Frequency 2x / week   PT Duration 6 weeks   PT Treatment/Interventions Patient/family education;Therapeutic activities;Therapeutic exercise;Balance training;Neuromuscular re-education;Functional mobility training;Stair training;Gait training;DME Instruction;Electrical Stimulation;Manual techniques;ADLs/Self Care Home Management;Cryotherapy;Iontophoresis 4mg /ml Dexamethasone;Moist Heat;Orthotic Fit/Training;Passive range of motion;Energy conservation;Vestibular;Visual/perceptual remediation/compensation   PT Next Visit Plan LE strengthening,  transfer training, gait, balance   PT Home Exercise Plan see sheet   Consulted and Agree with Plan of Care Patient;Family member/caregiver   Family Member Consulted caregiver      Patient will benefit from skilled therapeutic intervention in order to improve the following deficits and impairments:  Abnormal gait, Decreased balance, Decreased cognition, Decreased knowledge of precautions, Decreased knowledge of use of DME, Decreased safety awareness, Decreased strength, Difficulty walking, Improper body mechanics, Decreased activity tolerance, Decreased endurance, Decreased range of motion, Dizziness, Impaired perceived functional ability, Impaired flexibility, Postural dysfunction  Visit Diagnosis: Unsteadiness on feet  Difficulty in walking, not elsewhere classified  History of falling     Problem List Patient Active Problem List   Diagnosis Date Noted  . Infection of urinary tract 02/21/2016  . Dementia 02/21/2016  . Generalized weakness 02/21/2016  . Slurred speech 02/20/2016  . Depression 02/20/2016  . Broken ribs 08/11/2015  . UTI (lower urinary tract infection) 11/12/2014  . Encephalopathy, metabolic 11/12/2014  . Rheumatoid arthritis (HCC) 11/12/2014  . Chronic anticoagulation 11/12/2014  . Atrial fibrillation (HCC) 11/12/2014  . H/O: CVA (cerebrovascular accident) 11/12/2014    Precious Bard, PT, DPT   Precious Bard 03/09/2017, 5:43 PM  [facility] MAIN [facility] SERVICES [address] [place], [place], [address] Phone: [phone]   Fax:  [phone]  Name: Kelly Wright MRN: [id number] Date of Birth: 1933-04-10

## 2017-03-11 ENCOUNTER — Ambulatory Visit: Payer: Medicare Other

## 2017-03-11 DIAGNOSIS — R2681 Unsteadiness on feet: Secondary | ICD-10-CM

## 2017-03-11 DIAGNOSIS — R262 Difficulty in walking, not elsewhere classified: Secondary | ICD-10-CM

## 2017-03-11 DIAGNOSIS — Z9181 History of falling: Secondary | ICD-10-CM

## 2017-03-11 NOTE — Therapy (Signed)
[facility] MAIN [facility] SERVICES [address] [place], [place], [address] Phone: [phone]   Fax:  [phone]  Physical Therapy Treatment  Patient Details  Name: Kelly Wright MRN: [id number] Date of Birth: [birth year] Referring Provider: Dr. Malvin Johns  Encounter Date: 03/11/2017      PT End of Session - 03/11/17 1447    Visit Number 3   Number of Visits 12   Date for PT Re-Evaluation 04/15/17   Authorization - Visit Number 3   Authorization - Number of Visits 10   PT Start Time 1430   PT Stop Time 1515   PT Time Calculation (min) 45 min   Equipment Utilized During Treatment Gait belt   Activity Tolerance Patient tolerated treatment well;Other (comment)   Behavior During Therapy [facility] for tasks assessed/performed;Impulsive;Restless      Past Medical History:  Diagnosis Date  . Arthritis   . Atrial fibrillation (HCC)   . Cerebral vascular accident (HCC)   . DVT (deep venous thrombosis) (HCC)   . Dysrhythmia   . GERD (gastroesophageal reflux disease)   . Interstitial lung disease (HCC)   . Osteoporosis     Past Surgical History:  Procedure Laterality Date  . ABDOMINAL HYSTERECTOMY    . BILATERAL CARPAL TUNNEL RELEASE    . EYE SURGERY      There were no vitals filed for this visit.      Subjective Assessment - 03/11/17 1435    Subjective caregiver reports no falls since last visit. Pt. reports feeling alright and going to doctor.    Pertinent History Patient is a pleasant 83 year old female with advancing dementia accompanied by caregiver. Caregiver reports that the daughter took the patient off medications. Medications were quit cold [place] according to caregiver.  Has medication for anxiety, folic acid, aspirin, and another pill. Daughter in law does her medication every week. Was at [facility] two weeks ago. Has good days and bad days. Doesn't walk with walker or cane. Occasionally 2x/ a week will use cane at night. Fell 3x last  week, 1x this week with current caregiver. Patient has a 24/7 caregiver and never left a long. Walks short distances with hand hold assist.    Limitations Lifting;Standing;Walking;House hold activities   How long can you sit comfortably? n/a   How long can you stand comfortably? short durations   How long can you walk comfortably? 50 ft   Patient Stated Goals to be more independent, do what I want to.    Currently in Pain? No/denies    ambulate 2x 50 ft with RW and CGA. Constant verbal cueing for lifting feet, maintaining feet within walker, and PT directing walker direction.  Sit to stand from chair  standing marches with walker Seated abduction RTB x 20 Sit to stand transfers wheelchair to walker  Seated marches 20x RTB   Neuro re-ed Standing in // bars balance Large step forward and back with BUE support and max verbal cueing  went to bathroom with caregiver                          PT Education - 03/11/17 1447    Education provided Yes   Education Details ambulating with walker   Person(s) Educated Patient   Methods Explanation;Demonstration;Verbal cues;Tactile cues   Comprehension Verbal cues required;Tactile cues required;Need further instruction          PT Short Term Goals - 03/04/17 2154  PT SHORT TERM GOAL #1   Title Patient will static stand for 2 minutes without LOB to improve ability to assist/perform ADLs   Baseline 8/23: unable to static stand for >1 minute   Time 2   Period Weeks   Status New   Target Date 03/18/17     PT SHORT TERM GOAL #2   Title Patient will ambulate with CGA 20 ft without LOB or stopping to improve mobility within home.    Baseline ambulate 10 ft with handhold assist and frequent misteppings and stops   Time 2   Period Weeks   Status New           PT Long Term Goals - 03/04/17 2156      PT LONG TERM GOAL #1   Title Patient will be able to ambulate up to 150 ft with SPC/ least assistive device with  SBA and no LOB or knee buckling to promote mobility.   Baseline hand hold assist 10 ft   Time 6   Period Weeks   Status New     PT LONG TERM GOAL #2   Title Patient will increase Berg Balance score by > 6 points (23/56) to demonstrate decreased fall risk during functional activities.   Baseline 8/23: 17/56   Time 6   Period Weeks   Status New     PT LONG TERM GOAL #3   Title Patient will increase 10 meter walk test to >.57m/s as to improve gait speed for better community ambulation and to reduce fall risk.   Baseline 8/23: .17m/s   Time 6   Period Weeks   Status New     PT LONG TERM GOAL #4   Title Patient (> 8 years old) will complete five times sit to stand test in < 15 seconds indicating an increased LE strength and improved balance.   Baseline 8/23: 26 seconds   Time 6   Period Weeks   Status New     PT LONG TERM GOAL #5   Title Patient will deny any falls over past 4 weeks to demonstrate improved safety awareness at home and work.    Baseline Frequent falls   Time 6   Period Weeks   Status New               Plan - 03/12/17 6767    Clinical Impression Statement Patient requires constant cueing, both verbal and tactile, when ambulating with RW. Patient demonstrated prolonged standing tolerance this session with decreased need for breaks when fatigued. Posterior sway in upright position noted when performing standing interventions. Patient will continue to benefit from skilled physical therapy to decrease fall risk and improve quality of life.    Rehab Potential Fair   Clinical Impairments Affecting Rehab Potential dementia, hx of stroke, osteoporosis, altered mental status, hx of falls   PT Frequency 2x / week   PT Duration 6 weeks   PT Treatment/Interventions Patient/family education;Therapeutic activities;Therapeutic exercise;Balance training;Neuromuscular re-education;Functional mobility training;Stair training;Gait training;DME Instruction;Electrical  Stimulation;Manual techniques;ADLs/Self Care Home Management;Cryotherapy;Iontophoresis 4mg /ml Dexamethasone;Moist Heat;Orthotic Fit/Training;Passive range of motion;Energy conservation;Vestibular;Visual/perceptual remediation/compensation   PT Next Visit Plan LE strengthening, transfer training, gait, balance   PT Home Exercise Plan see sheet   Consulted and Agree with Plan of Care Patient;Family member/caregiver   Family Member Consulted caregiver      Patient will benefit from skilled therapeutic intervention in order to improve the following deficits and impairments:  Abnormal gait, Decreased balance, Decreased cognition, Decreased knowledge of precautions,  Decreased knowledge of use of DME, Decreased safety awareness, Decreased strength, Difficulty walking, Improper body mechanics, Decreased activity tolerance, Decreased endurance, Decreased range of motion, Dizziness, Impaired perceived functional ability, Impaired flexibility, Postural dysfunction  Visit Diagnosis: Unsteadiness on feet  Difficulty in walking, not elsewhere classified  History of falling     Problem List Patient Active Problem List   Diagnosis Date Noted  . Infection of urinary tract 02/21/2016  . Dementia 02/21/2016  . Generalized weakness 02/21/2016  . Slurred speech 02/20/2016  . Depression 02/20/2016  . Broken ribs 08/11/2015  . UTI (lower urinary tract infection) 11/12/2014  . Encephalopathy, metabolic 11/12/2014  . Rheumatoid arthritis (HCC) 11/12/2014  . Chronic anticoagulation 11/12/2014  . Atrial fibrillation (HCC) 11/12/2014  . H/O: CVA (cerebrovascular accident) 11/12/2014   Precious Bard, PT, DPT   Precious Bard 03/12/2017, 8:54 AM  [facility] MAIN [facility] SERVICES [address] [place], [place], [address] Phone: [phone]   Fax:  [phone]  Name: Kelly Wright MRN: [id number] Date of Birth: 16-Dec-1932

## 2017-03-17 ENCOUNTER — Ambulatory Visit: Payer: Medicare Other

## 2017-03-18 ENCOUNTER — Ambulatory Visit: Payer: Medicare Other

## 2017-03-19 ENCOUNTER — Ambulatory Visit: Payer: Medicare Other

## 2017-03-22 ENCOUNTER — Ambulatory Visit: Payer: Medicare Other | Attending: Family Medicine

## 2017-03-22 DIAGNOSIS — R262 Difficulty in walking, not elsewhere classified: Secondary | ICD-10-CM | POA: Insufficient documentation

## 2017-03-22 DIAGNOSIS — R2681 Unsteadiness on feet: Secondary | ICD-10-CM

## 2017-03-22 DIAGNOSIS — Z9181 History of falling: Secondary | ICD-10-CM | POA: Diagnosis present

## 2017-03-22 NOTE — Therapy (Signed)
[facility] MAIN [facility] SERVICES [address] [place], [place], [address] Phone: [phone]   Fax:  [phone]  Physical Therapy Treatment  Patient Details  Name: Kelly Wright MRN: [id number] Date of Birth: Sep 08, 1932 Referring Provider: Dr. Malvin Johns  Encounter Date: 03/22/2017      PT End of Session - 03/23/17 0752    Visit Number 4   Number of Visits 12   Date for PT Re-Evaluation 04/15/17   Authorization - Visit Number 4   Authorization - Number of Visits 10   PT Start Time 1345   PT Stop Time 1430   PT Time Calculation (min) 45 min   Equipment Utilized During Treatment Gait belt   Activity Tolerance Patient tolerated treatment well;Other (comment)   Behavior During Therapy [facility] for tasks assessed/performed;Impulsive;Restless      Past Medical History:  Diagnosis Date  . Arthritis   . Atrial fibrillation (HCC)   . Cerebral vascular accident (HCC)   . DVT (deep venous thrombosis) (HCC)   . Dysrhythmia   . GERD (gastroesophageal reflux disease)   . Interstitial lung disease (HCC)   . Osteoporosis     Past Surgical History:  Procedure Laterality Date  . ABDOMINAL HYSTERECTOMY    . BILATERAL CARPAL TUNNEL RELEASE    . EYE SURGERY      There were no vitals filed for this visit.      Subjective Assessment - 03/23/17 0751    Subjective caregiver and patient reports no falls. says pt. has had some weakness of LE's since last session.    Pertinent History Patient is a pleasant 83 year old female with advancing dementia accompanied by caregiver. Caregiver reports that the daughter took the patient off medications. Medications were quit cold [place] according to caregiver.  Has medication for anxiety, folic acid, aspirin, and another pill. Daughter in law does her medication every week. Was at [facility] two weeks ago. Has good days and bad days. Doesn't walk with walker or cane. Occasionally 2x/ a week will use cane at night. Fell 3x  last week, 1x this week with current caregiver. Patient has a 24/7 caregiver and never left a long. Walks short distances with hand hold assist.    Limitations Lifting;Standing;Walking;House hold activities   How long can you sit comfortably? n/a   How long can you stand comfortably? short durations   How long can you walk comfortably? 50 ft   Patient Stated Goals to be more independent, do what I want to.    Currently in Pain? No/denies      ambulate 2x 70 ft with RW and CGA. Constant verbal cueing for lifting feet, maintaining feet within walker, and PT directing walker direction.  Sit to stand from plinth x 15 with throwing balls at top of stand,  standing marches with walker Standing with no support 10x  Seated abduction RTB x 20 Sit to stand transfers wheelchair to walker  Seated marches 15x each leg with 1lb ankle weights, 15x 2lb ankle weights 1 lb ankle weights knee extension seated 15x each leg   2lb ankle weight knee extension seated 15x each leg  Seated adduction green ball 15x  Seated abduction RTB x 15 Seated RTB resisted knee flexion x 15 each leg cues for keeping legs from compensating     Pt. response to medical necessity: . Patient will continue to benefit from skilled physical therapy to decrease fall risk and improve quality of life.  PT Education - 03/23/17 0752    Education provided Yes   Education Details gait mechanics   Person(s) Educated Patient   Methods Explanation;Demonstration   Comprehension Verbalized understanding;Returned demonstration          PT Short Term Goals - 03/04/17 2154      PT SHORT TERM GOAL #1   Title Patient will static stand for 2 minutes without LOB to improve ability to assist/perform ADLs   Baseline 8/23: unable to static stand for >1 minute   Time 2   Period Weeks   Status New   Target Date 03/18/17     PT SHORT TERM GOAL #2   Title Patient will ambulate with CGA 20 ft  without LOB or stopping to improve mobility within home.    Baseline ambulate 10 ft with handhold assist and frequent misteppings and stops   Time 2   Period Weeks   Status New           PT Long Term Goals - 03/04/17 2156      PT LONG TERM GOAL #1   Title Patient will be able to ambulate up to 150 ft with SPC/ least assistive device with SBA and no LOB or knee buckling to promote mobility.   Baseline hand hold assist 10 ft   Time 6   Period Weeks   Status New     PT LONG TERM GOAL #2   Title Patient will increase Berg Balance score by > 6 points (23/56) to demonstrate decreased fall risk during functional activities.   Baseline 8/23: 17/56   Time 6   Period Weeks   Status New     PT LONG TERM GOAL #3   Title Patient will increase 10 meter walk test to >.5m/s as to improve gait speed for better community ambulation and to reduce fall risk.   Baseline 8/23: .65m/s   Time 6   Period Weeks   Status New     PT LONG TERM GOAL #4   Title Patient (> 45 years old) will complete five times sit to stand test in < 15 seconds indicating an increased LE strength and improved balance.   Baseline 8/23: 26 seconds   Time 6   Period Weeks   Status New     PT LONG TERM GOAL #5   Title Patient will deny any falls over past 4 weeks to demonstrate improved safety awareness at home and work.    Baseline Frequent falls   Time 6   Period Weeks   Status New               Plan - 03/23/17 0754    Clinical Impression Statement Patient demonstrated improved tolerance for ambulatory capacity with constant cueing needed of sequencing of AD. Patient fatigued from sit to stands quickly and had noted excessive flexing of LE's due to fatigue. Patient will continue to benefit from skilled physical therapy to decrease fall risk and improve quality of life.    Rehab Potential Fair   Clinical Impairments Affecting Rehab Potential dementia, hx of stroke, osteoporosis, altered mental status, hx of  falls   PT Frequency 2x / week   PT Duration 6 weeks   PT Treatment/Interventions Patient/family education;Therapeutic activities;Therapeutic exercise;Balance training;Neuromuscular re-education;Functional mobility training;Stair training;Gait training;DME Instruction;Electrical Stimulation;Manual techniques;ADLs/Self Care Home Management;Cryotherapy;Iontophoresis 4mg /ml Dexamethasone;Moist Heat;Orthotic Fit/Training;Passive range of motion;Energy conservation;Vestibular;Visual/perceptual remediation/compensation   PT Next Visit Plan LE strengthening, transfer training, gait, balance   PT Home Exercise Plan see sheet  Consulted and Agree with Plan of Care Patient;Family member/caregiver   Family Member Consulted caregiver      Patient will benefit from skilled therapeutic intervention in order to improve the following deficits and impairments:  Abnormal gait, Decreased balance, Decreased cognition, Decreased knowledge of precautions, Decreased knowledge of use of DME, Decreased safety awareness, Decreased strength, Difficulty walking, Improper body mechanics, Decreased activity tolerance, Decreased endurance, Decreased range of motion, Dizziness, Impaired perceived functional ability, Impaired flexibility, Postural dysfunction  Visit Diagnosis: Unsteadiness on feet  Difficulty in walking, not elsewhere classified  History of falling     Problem List Patient Active Problem List   Diagnosis Date Noted  . Infection of urinary tract 02/21/2016  . Dementia 02/21/2016  . Generalized weakness 02/21/2016  . Slurred speech 02/20/2016  . Depression 02/20/2016  . Broken ribs 08/11/2015  . UTI (lower urinary tract infection) 11/12/2014  . Encephalopathy, metabolic 11/12/2014  . Rheumatoid arthritis (HCC) 11/12/2014  . Chronic anticoagulation 11/12/2014  . Atrial fibrillation (HCC) 11/12/2014  . H/O: CVA (cerebrovascular accident) 11/12/2014   Precious Bard, PT, DPT   Precious Bard 03/23/2017, 7:54 AM  [place] [facility] MAIN [facility] SERVICES [address] [place], [place], [address] Phone: [phone]   Fax:  [phone]  Name: VIRGINIE JOSTEN MRN: [id number] Date of Birth: 01-Dec-1932

## 2017-03-24 ENCOUNTER — Ambulatory Visit: Payer: Medicare Other

## 2017-03-29 ENCOUNTER — Ambulatory Visit: Payer: Medicare Other

## 2017-03-29 DIAGNOSIS — R2681 Unsteadiness on feet: Secondary | ICD-10-CM

## 2017-03-29 DIAGNOSIS — Z9181 History of falling: Secondary | ICD-10-CM

## 2017-03-29 DIAGNOSIS — R262 Difficulty in walking, not elsewhere classified: Secondary | ICD-10-CM

## 2017-03-29 NOTE — Therapy (Signed)
[place] [facility] MAIN [facility] SERVICES [address] [place], [place], [address] Phone: [phone]   Fax:  [phone]  Physical Therapy Treatment  Patient Details  Name: Kelly Wright MRN: [id number] Date of Birth: 1933/03/28 Referring Provider: Dr. Malvin Johns  Encounter Date: 03/29/2017      PT End of Session - 03/29/17 1359    Visit Number 5   Number of Visits 12   Date for PT Re-Evaluation 04/15/17   Authorization - Visit Number 5   Authorization - Number of Visits 10   PT Start Time 1345   PT Stop Time 1430   PT Time Calculation (min) 45 min   Equipment Utilized During Treatment Gait belt   Activity Tolerance Patient tolerated treatment well;Other (comment)   Behavior During Therapy [facility] for tasks assessed/performed;Impulsive;Restless      Past Medical History:  Diagnosis Date  . Arthritis   . Atrial fibrillation (HCC)   . Cerebral vascular accident (HCC)   . DVT (deep venous thrombosis) (HCC)   . Dysrhythmia   . GERD (gastroesophageal reflux disease)   . Interstitial lung disease (HCC)   . Osteoporosis     Past Surgical History:  Procedure Laterality Date  . ABDOMINAL HYSTERECTOMY    . BILATERAL CARPAL TUNNEL RELEASE    . EYE SURGERY      There were no vitals filed for this visit.      Subjective Assessment - 03/29/17 1349    Subjective Caregiver reports no falls or stumbles. Went to doctor last week.    Pertinent History Patient is a pleasant 83 year old female with advancing dementia accompanied by caregiver. Caregiver reports that the daughter took the patient off medications. Medications were quit cold [place] according to caregiver.  Has medication for anxiety, folic acid, aspirin, and another pill. Daughter in law does her medication every week. Was at [facility] two weeks ago. Has good days and bad days. Doesn't walk with walker or cane. Occasionally 2x/ a week will use cane at night. Fell 3x last week, 1x this week with  current caregiver. Patient has a 24/7 caregiver and never left a long. Walks short distances with hand hold assist.    Limitations Lifting;Standing;Walking;House hold activities   How long can you sit comfortably? n/a   How long can you stand comfortably? short durations   How long can you walk comfortably? 50 ft   Patient Stated Goals to be more independent, do what I want to.    Currently in Pain? No/denies     TherEx:  Toe tap 6" step 20x  BUE support required  6" step step ups 6x each leg, BUE support  Sit to stand transfers, CGA.  Step over two consecutive hurdles in // bars 6x 2lb ankle weight : knee extension 10x each leg, marching 10x  Seated abduction RTB x 10 Seated adduction 10x with green theraball Seated step outs 10x each leg RTB around ankles  Neuro Re-ed  Airex pad balance hold 60 seconds, frequent cues to let go of UEs on bars required  airex pad: horizontal head turns 60 seconds Ambulating backwards in // bars with UE support and CGA   Cues for task orientation required frequently    Pt. response to medical necessity: Patient will continue to benefit from skilled physical therapy to decrease fall risk and improve quality of life.  PT Education - 03/29/17 1350    Education provided Yes   Education Details Estate manager/land agent for functional strength and balance   Person(s) Educated Patient   Methods Explanation;Demonstration   Comprehension Verbalized understanding;Returned demonstration          PT Short Term Goals - 03/04/17 2154      PT SHORT TERM GOAL #1   Title Patient will static stand for 2 minutes without LOB to improve ability to assist/perform ADLs   Baseline 8/23: unable to static stand for >1 minute   Time 2   Period Weeks   Status New   Target Date 03/18/17     PT SHORT TERM GOAL #2   Title Patient will ambulate with CGA 20 ft without LOB or stopping to improve mobility within home.    Baseline  ambulate 10 ft with handhold assist and frequent misteppings and stops   Time 2   Period Weeks   Status New           PT Long Term Goals - 03/04/17 2156      PT LONG TERM GOAL #1   Title Patient will be able to ambulate up to 150 ft with SPC/ least assistive device with SBA and no LOB or knee buckling to promote mobility.   Baseline hand hold assist 10 ft   Time 6   Period Weeks   Status New     PT LONG TERM GOAL #2   Title Patient will increase Berg Balance score by > 6 points (23/56) to demonstrate decreased fall risk during functional activities.   Baseline 8/23: 17/56   Time 6   Period Weeks   Status New     PT LONG TERM GOAL #3   Title Patient will increase 10 meter walk test to >.69m/s as to improve gait speed for better community ambulation and to reduce fall risk.   Baseline 8/23: .90m/s   Time 6   Period Weeks   Status New     PT LONG TERM GOAL #4   Title Patient (> 52 years old) will complete five times sit to stand test in < 15 seconds indicating an increased LE strength and improved balance.   Baseline 8/23: 26 seconds   Time 6   Period Weeks   Status New     PT LONG TERM GOAL #5   Title Patient will deny any falls over past 4 weeks to demonstrate improved safety awareness at home and work.    Baseline Frequent falls   Time 6   Period Weeks   Status New               Plan - 03/29/17 1414    Clinical Impression Statement Patient presented with improved ability to perform multiple trials of interventions without sitting. Posterior Lean noted throughout balance and dynamic activities due to decreased strength of LE's and excessive duration of sitting position throughout day. Patient will continue to benefit from skilled physical therapy to decrease fall risk and improve quality of life.   Rehab Potential Fair   Clinical Impairments Affecting Rehab Potential dementia, hx of stroke, osteoporosis, altered mental status, hx of falls   PT Frequency 2x /  week   PT Duration 6 weeks   PT Treatment/Interventions Patient/family education;Therapeutic activities;Therapeutic exercise;Balance training;Neuromuscular re-education;Functional mobility training;Stair training;Gait training;DME Instruction;Electrical Stimulation;Manual techniques;ADLs/Self Care Home Management;Cryotherapy;Iontophoresis 4mg /ml Dexamethasone;Moist Heat;Orthotic Fit/Training;Passive range of motion;Energy conservation;Vestibular;Visual/perceptual remediation/compensation   PT Next Visit Plan LE strengthening, transfer training, gait, balance  PT Home Exercise Plan see sheet   Consulted and Agree with Plan of Care Patient;Family member/caregiver   Family Member Consulted caregiver      Patient will benefit from skilled therapeutic intervention in order to improve the following deficits and impairments:  Abnormal gait, Decreased balance, Decreased cognition, Decreased knowledge of precautions, Decreased knowledge of use of DME, Decreased safety awareness, Decreased strength, Difficulty walking, Improper body mechanics, Decreased activity tolerance, Decreased endurance, Decreased range of motion, Dizziness, Impaired perceived functional ability, Impaired flexibility, Postural dysfunction  Visit Diagnosis: Unsteadiness on feet  Difficulty in walking, not elsewhere classified  History of falling     Problem List Patient Active Problem List   Diagnosis Date Noted  . Infection of urinary tract 02/21/2016  . Dementia 02/21/2016  . Generalized weakness 02/21/2016  . Slurred speech 02/20/2016  . Depression 02/20/2016  . Broken ribs 08/11/2015  . UTI (lower urinary tract infection) 11/12/2014  . Encephalopathy, metabolic 11/12/2014  . Rheumatoid arthritis (HCC) 11/12/2014  . Chronic anticoagulation 11/12/2014  . Atrial fibrillation (HCC) 11/12/2014  . H/O: CVA (cerebrovascular accident) 11/12/2014   Precious Bard, PT, DPT   Precious Bard 03/29/2017, 2:30 PM  [facility] MAIN [facility] SERVICES [address] [place], [place], [address] Phone: [phone]   Fax:  [phone]  Name: Kelly Wright MRN: [id number] Date of Birth: 1932-07-31

## 2017-03-31 ENCOUNTER — Ambulatory Visit: Payer: Medicare Other

## 2017-03-31 DIAGNOSIS — R2681 Unsteadiness on feet: Secondary | ICD-10-CM

## 2017-03-31 DIAGNOSIS — R262 Difficulty in walking, not elsewhere classified: Secondary | ICD-10-CM

## 2017-03-31 DIAGNOSIS — Z9181 History of falling: Secondary | ICD-10-CM

## 2017-03-31 NOTE — Therapy (Signed)
[place] MAIN [facility] SERVICES [place], [place], [address] Phone: [phone]   Fax:  [phone]  Physical Therapy Treatment  Patient Details  Name: Kelly Wright MRN: [id number] Date of Birth: Dec 07, 1932 Referring Provider: Dr. Melrose Nakayama  Encounter Date: 03/31/2017      PT End of Session - 03/31/17 1411    Visit Number 6   Number of Visits 12   Date for PT Re-Evaluation 04/15/17   Authorization - Visit Number 6   Authorization - Number of Visits 10   PT Start Time 3361   PT Stop Time 1430   PT Time Calculation (min) 39 min   Equipment Utilized During Treatment Gait belt   Activity Tolerance Patient tolerated treatment well;Other (comment)   Behavior During Therapy [facility] for tasks assessed/performed;Impulsive;Restless      Past Medical History:  Diagnosis Date  . Arthritis   . Atrial fibrillation ([place])   . Cerebral vascular accident ([place])   . DVT (deep venous thrombosis) ([place])   . Dysrhythmia   . GERD (gastroesophageal reflux disease)   . Interstitial lung disease ([place])   . Osteoporosis     Past Surgical History:  Procedure Laterality Date  . ABDOMINAL HYSTERECTOMY    . BILATERAL CARPAL TUNNEL RELEASE    . EYE SURGERY      There were no vitals filed for this visit.      Subjective Assessment - 03/31/17 1356    Subjective Patient reports no falls. Less confused upon presentation today   Pertinent History Patient is a pleasant 83 year old female with advancing dementia accompanied by caregiver. Caregiver reports that the daughter took the patient off medications. Medications were quit cold [place] according to caregiver.  Has medication for anxiety, folic acid, aspirin, and another pill. Daughter in law does her medication every week. Was at [facility] two weeks ago. Has good days and bad days. Doesn't walk with walker or cane. Occasionally 2x/ a week will use cane at night. Fell 3x last week, 1x this week with current  caregiver. Patient has a 24/7 caregiver and never left a long. Walks short distances with hand hold assist.    Limitations Lifting;Standing;Walking;House hold activities   How long can you sit comfortably? n/a   How long can you stand comfortably? short durations   How long can you walk comfortably? 50 ft   Patient Stated Goals to be more independent, do what I want to.    Currently in Pain? No/denies      Static stand 2 minutes  CGA 20 ft  10MWT: 19 seconds with RW and CGA: =.584ms 5x STS 26 seconds  Stand pivot transfer x 4 ,CGA-ModA Ambulating 20 ft with RW and CGA, frequent cueing for task orientation, keeping feet within walker, upright posture, forward propulsion.  Standing marches with RW and CGA in front of mirror 20x, cueing for sequencing.  Sit to stand sequencing to use hands to reach back (tendency to keep hands on walker) 5x total, required max cueing for sequencing and task orientation  Knee extension with 2lb ankle weight 10x each leg  Seated abduction RTB 10x   Patient requires maximal cueing for task orientation and continuation due to dementia. Tasks take additional time to allow patient to process request.  Required rest room break in middle of session.  PT Education - 03/31/17 1413    Education provided Yes   Education Details ambulatory mechanics for improved velocity and safety   Person(s) Educated Patient   Methods Explanation;Demonstration   Comprehension Verbalized understanding;Returned demonstration          PT Short Term Goals - 03/31/17 1404      PT SHORT TERM GOAL #1   Title Patient will static stand for 2 minutes without LOB to improve ability to assist/perform ADLs   Baseline 8/23: unable to static stand for >1 minute 9/19: static stand with UE support for 2 minutes   Time 2   Period Weeks   Status Partially Met     PT SHORT TERM GOAL #2   Title Patient will ambulate with CGA 20 ft  without LOB or stopping to improve mobility within home.    Baseline ambulate 10 ft with handhold assist and frequent misteppings and stops, 9/19: 20 ft with CGA and RW   Time 2   Period Weeks   Status Achieved           PT Long Term Goals - 03/31/17 1402      PT LONG TERM GOAL #1   Title Patient will be able to ambulate up to 150 ft with SPC/ least assistive device with SBA and no LOB or knee buckling to promote mobility.   Baseline hand hold assist 10 ft   Time 6   Period Weeks   Status New     PT LONG TERM GOAL #2   Title Patient will increase Berg Balance score by > 6 points (23/56) to demonstrate decreased fall risk during functional activities.   Baseline 8/23: 17/56   Time 6   Period Weeks   Status New     PT LONG TERM GOAL #3   Title Patient will increase 10 meter walk test to >.78ms as to improve gait speed for better community ambulation and to reduce fall risk.   Baseline 8/23: .180m 9/19: .526 m/s with RW and CGA   Time 6   Period Weeks   Status Partially Met     PT LONG TERM GOAL #4   Title Patient (> 6069ears old) will complete five times sit to stand test in < 15 seconds indicating an increased LE strength and improved balance.   Baseline 8/23: 26 seconds 9/19: 26 seconds: pause in center of task due to forgotting #.    Time 6   Period Weeks   Status Partially Met     PT LONG TERM GOAL #5   Title Patient will deny any falls over past 4 weeks to demonstrate improved safety awareness at home and work.    Baseline pt. caregiver report decrease in fall    Time 6   Period Weeks   Status Partially Met               Plan - 03/31/17 1411    Clinical Impression Statement Patient improving ambulation velocity with 10MWT to .52659m Stand pivot transfer attempted multiple times with patient ability to participate varying with level of understanding. Pt. Demonstrated ability to static stand for 2 minutes with UE support. 5x STS remained same time of 26  seconds due to patient pausing mid task due to forgetting. Patient will continue to benefit from skilled physical therapy to decrease fall risk and improve quality of life.    Rehab Potential Fair   Clinical Impairments Affecting Rehab Potential dementia, hx of stroke, osteoporosis, altered mental  status, hx of falls   PT Frequency 2x / week   PT Duration 6 weeks   PT Treatment/Interventions Patient/family education;Therapeutic activities;Therapeutic exercise;Balance training;Neuromuscular re-education;Functional mobility training;Stair training;Gait training;DME Instruction;Electrical Stimulation;Manual techniques;ADLs/Self Care Home Management;Cryotherapy;Iontophoresis 92m/ml Dexamethasone;Moist Heat;Orthotic Fit/Training;Passive range of motion;Energy conservation;Vestibular;Visual/perceptual remediation/compensation   PT Next Visit Plan goals, BERG   PT Home Exercise Plan see sheet   Consulted and Agree with Plan of Care Patient;Family member/caregiver   Family Member Consulted caregiver      Patient will benefit from skilled therapeutic intervention in order to improve the following deficits and impairments:  Abnormal gait, Decreased balance, Decreased cognition, Decreased knowledge of precautions, Decreased knowledge of use of DME, Decreased safety awareness, Decreased strength, Difficulty walking, Improper body mechanics, Decreased activity tolerance, Decreased endurance, Decreased range of motion, Dizziness, Impaired perceived functional ability, Impaired flexibility, Postural dysfunction  Visit Diagnosis: Unsteadiness on feet  Difficulty in walking, not elsewhere classified  History of falling     Problem List Patient Active Problem List   Diagnosis Date Noted  . Infection of urinary tract 02/21/2016  . Dementia 02/21/2016  . Generalized weakness 02/21/2016  . Slurred speech 02/20/2016  . Depression 02/20/2016  . Broken ribs 08/11/2015  . UTI (lower urinary tract infection)  11/12/2014  . Encephalopathy, metabolic 067/61/9509 . Rheumatoid arthritis ([place] 11/12/2014  . Chronic anticoagulation 11/12/2014  . Atrial fibrillation ([place] 11/12/2014  . H/O: CVA (cerebrovascular accident) 11/12/2014   MJanna Arch PT, DPT   MJanna Arch9/19/2018, 4:16 PM  [place] [facility] [facility] [address] [address]Phone: [phone]  Fax:  [phone] Name: BLOCKLYN HENRIQUEZMRN: [id number] of Birth: 9August 31, 1934

## 2017-04-05 ENCOUNTER — Ambulatory Visit: Payer: Medicare Other

## 2017-04-05 VITALS — BP 113/50 | HR 53

## 2017-04-05 DIAGNOSIS — R2681 Unsteadiness on feet: Secondary | ICD-10-CM | POA: Diagnosis not present

## 2017-04-05 DIAGNOSIS — R262 Difficulty in walking, not elsewhere classified: Secondary | ICD-10-CM

## 2017-04-05 NOTE — Therapy (Signed)
[place] MAIN [facility] SERVICES [place], [place], [address] Phone: [phone]   Fax:  [phone]  Physical Therapy Treatment  Patient Details  Name: Gentry Seeber Wirz MRN: [id number] Date of Birth: 1933/06/19 Referring Provider: Dr. Melrose Nakayama  Encounter Date: 04/05/2017      PT End of Session - 04/05/17 1351    Visit Number 7   Number of Visits 12   Date for PT Re-Evaluation 04/15/17   Authorization - Visit Number 7   Authorization - Number of Visits 10   PT Start Time 7048   PT Stop Time 1430   PT Time Calculation (min) 42 min   Equipment Utilized During Treatment Gait belt   Activity Tolerance Patient tolerated treatment well   Behavior During Therapy [facility] for tasks assessed/performed;Impulsive;Restless      Past Medical History:  Diagnosis Date  . Arthritis   . Atrial fibrillation ([place])   . Cerebral vascular accident ([place])   . DVT (deep venous thrombosis) ([place])   . Dysrhythmia   . GERD (gastroesophageal reflux disease)   . Interstitial lung disease ([place])   . Osteoporosis     Past Surgical History:  Procedure Laterality Date  . ABDOMINAL HYSTERECTOMY    . BILATERAL CARPAL TUNNEL RELEASE    . EYE SURGERY      Vitals:   04/05/17 1353  BP: (!) 113/50  Pulse: (!) 53  SpO2: 100%        Subjective Assessment - 04/05/17 1347    Subjective Pt is doing well today without any specific questions or concerns. She denies pain. Caregiver reports no changes in health or medications. No falls since last visit.    Pertinent History Patient is a pleasant 83 year old female with advancing dementia accompanied by caregiver. Caregiver reports that the daughter took the patient off medications. Medications were quit cold [place] according to caregiver.  Has medication for anxiety, folic acid, aspirin, and another pill. Daughter in law does her medication every week. Was at [facility] two weeks ago. Has good days and bad days. Doesn't  walk with walker or cane. Occasionally 2x/ a week will use cane at night. Fell 3x last week, 1x this week with current caregiver. Patient has a 24/7 caregiver and never left a long. Walks short distances with hand hold assist.    Limitations Lifting;Standing;Walking;House hold activities   How long can you sit comfortably? n/a   How long can you stand comfortably? short durations   How long can you walk comfortably? 50 ft   Patient Stated Goals to be more independent, do what I want to.    Currently in Pain? No/denies           TREATMENT  Ther-ex Sit to stand from mildly elevated mat table 2 x 10, sequencing to use hands to reach out in front of her to encourage anterior weight shifting required max cueing for sequencing and task orientation  Seated marches with 2# ankle weight 2 x 15 Seated LAQ with manual resistance 2 x 15; Seated clams with manual resistance 2 x 15; Standing marches in walker without UE support x 10 bilateral, minA/modA+1 with continual cues to shift weight forward and not to hold onto walker; Quantum leg press 75# 3 x 15, extensive cuing for form/technique; NuStep L2 x 5 minutes at end of session, continually monitored with cues to increase speed throughout as pt tends to slow down frequently; Patient requires maximal cueing for task orientation and continuation  due to dementia. Tasks take additional time to allow patient to process request.   Gait Training Ambulation in rehab gym with extensive cues for patient for upright posture, proper hand position on walker, to remain within walker, to stay close to walker during turns, and to keep walker close to body when backing up toward surfaces. Performed sit to stand from chairs throughout walking distance;                       PT Education - 04/05/17 1351    Education provided Yes   Education Details Exercise form/technique   Person(s) Educated Patient   Methods Explanation   Comprehension  Verbalized understanding          PT Short Term Goals - 03/31/17 1404      PT SHORT TERM GOAL #1   Title Patient will static stand for 2 minutes without LOB to improve ability to assist/perform ADLs   Baseline 8/23: unable to static stand for >1 minute 9/19: static stand with UE support for 2 minutes   Time 2   Period Weeks   Status Partially Met     PT SHORT TERM GOAL #2   Title Patient will ambulate with CGA 20 ft without LOB or stopping to improve mobility within home.    Baseline ambulate 10 ft with handhold assist and frequent misteppings and stops, 9/19: 20 ft with CGA and RW   Time 2   Period Weeks   Status Achieved           PT Long Term Goals - 03/31/17 1402      PT LONG TERM GOAL #1   Title Patient will be able to ambulate up to 150 ft with SPC/ least assistive device with SBA and no LOB or knee buckling to promote mobility.   Baseline hand hold assist 10 ft   Time 6   Period Weeks   Status New     PT LONG TERM GOAL #2   Title Patient will increase Berg Balance score by > 6 points (23/56) to demonstrate decreased fall risk during functional activities.   Baseline 8/23: 17/56   Time 6   Period Weeks   Status New     PT LONG TERM GOAL #3   Title Patient will increase 10 meter walk test to >.80ms as to improve gait speed for better community ambulation and to reduce fall risk.   Baseline 8/23: .140m 9/19: .526 m/s with RW and CGA   Time 6   Period Weeks   Status Partially Met     PT LONG TERM GOAL #4   Title Patient (> 6077ears old) will complete five times sit to stand test in < 15 seconds indicating an increased LE strength and improved balance.   Baseline 8/23: 26 seconds 9/19: 26 seconds: pause in center of task due to forgotting #.    Time 6   Period Weeks   Status Partially Met     PT LONG TERM GOAL #5   Title Patient will deny any falls over past 4 weeks to demonstrate improved safety awareness at home and work.    Baseline pt. caregiver  report decrease in fall    Time 6   Period Weeks   Status Partially Met               Plan - 04/05/17 1352    Clinical Impression Statement Pt does well today but requires continual cues throghout  session for proper exercise form, technique, and intensity. Poor safety awareness with sit to stand transfers and poor anterior weight shifting. Pt will continue to benefit from skilled PT services to decrease fall risk and im prove function.    Clinical Presentation Unstable   Clinical Decision Making High   Rehab Potential Fair   Clinical Impairments Affecting Rehab Potential dementia, hx of stroke, osteoporosis, altered mental status, hx of falls   PT Frequency 2x / week   PT Duration 6 weeks   PT Treatment/Interventions Patient/family education;Therapeutic activities;Therapeutic exercise;Balance training;Neuromuscular re-education;Functional mobility training;Stair training;Gait training;DME Instruction;Electrical Stimulation;Manual techniques;ADLs/Self Care Home Management;Cryotherapy;Iontophoresis 29m/ml Dexamethasone;Moist Heat;Orthotic Fit/Training;Passive range of motion;Energy conservation;Vestibular;Visual/perceptual remediation/compensation   PT Next Visit Plan goals, BERG   PT Home Exercise Plan see sheet   Consulted and Agree with Plan of Care Patient;Family member/caregiver   Family Member Consulted caregiver      Patient will benefit from skilled therapeutic intervention in order to improve the following deficits and impairments:  Abnormal gait, Decreased balance, Decreased cognition, Decreased knowledge of precautions, Decreased knowledge of use of DME, Decreased safety awareness, Decreased strength, Difficulty walking, Improper body mechanics, Decreased activity tolerance, Decreased endurance, Decreased range of motion, Dizziness, Impaired perceived functional ability, Impaired flexibility, Postural dysfunction  Visit Diagnosis: Unsteadiness on feet  Difficulty in  walking, not elsewhere classified     Problem List Patient Active Problem List   Diagnosis Date Noted  . Infection of urinary tract 02/21/2016  . Dementia 02/21/2016  . Generalized weakness 02/21/2016  . Slurred speech 02/20/2016  . Depression 02/20/2016  . Broken ribs 08/11/2015  . UTI (lower urinary tract infection) 11/12/2014  . Encephalopathy, metabolic 022/58/3462 . Rheumatoid arthritis ([address] 11/12/2014  . Chronic anticoagulation 11/12/2014  . Atrial fibrillation ([place] 11/12/2014  . H/O: CVA (cerebrovascular accident) 11/12/2014   JPhillips GroutPT, DPT   Huprich,Jason 04/05/2017, 2:31 PM  [place]MAIN [facility] CenterSERVICES [address] [place] [address]Phone: [phone]  Fax:  [phone] Name: BValleri HendricksenPage MRN: [id number] of Birth: [birth year]

## 2017-04-07 ENCOUNTER — Ambulatory Visit: Payer: Medicare Other

## 2017-04-07 DIAGNOSIS — R2681 Unsteadiness on feet: Secondary | ICD-10-CM

## 2017-04-07 DIAGNOSIS — Z9181 History of falling: Secondary | ICD-10-CM

## 2017-04-07 DIAGNOSIS — R262 Difficulty in walking, not elsewhere classified: Secondary | ICD-10-CM

## 2017-04-07 NOTE — Therapy (Signed)
[place] MAIN [facility] SERVICES [place], [place], [address] Phone: [phone]   Fax:  [phone]  Physical Therapy Treatment  Patient Details  Name: Kelly Wright MRN: [id number] Date of Birth: 02-14-1933 Referring Provider: Dr. Melrose Nakayama  Encounter Date: 04/07/2017      PT End of Session - 04/07/17 1542    Visit Number 8   Number of Visits 12   Date for PT Re-Evaluation 04/15/17   Authorization - Visit Number 8   Authorization - Number of Visits 10   PT Start Time 9381   PT Stop Time 1430   PT Time Calculation (min) 45 min   Equipment Utilized During Treatment Gait belt   Activity Tolerance Patient tolerated treatment well   Behavior During Therapy [facility] for tasks assessed/performed;Impulsive;Restless      Past Medical History:  Diagnosis Date  . Arthritis   . Atrial fibrillation ([place])   . Cerebral vascular accident ([place])   . DVT (deep venous thrombosis) ([place])   . Dysrhythmia   . GERD (gastroesophageal reflux disease)   . Interstitial lung disease ([place])   . Osteoporosis     Past Surgical History:  Procedure Laterality Date  . ABDOMINAL HYSTERECTOMY    . BILATERAL CARPAL TUNNEL RELEASE    . EYE SURGERY      There were no vitals filed for this visit.      Subjective Assessment - 04/07/17 1541    Subjective Patient reports feeling pretty good. Caregiver states no falls since last session, patient's balance has improved at home she has noted.    Pertinent History Patient is a pleasant 83 year old female with advancing dementia accompanied by caregiver. Caregiver reports that the daughter took the patient off medications. Medications were quit cold [place] according to caregiver.  Has medication for anxiety, folic acid, aspirin, and another pill. Daughter in law does her medication every week. Was at [facility] two weeks ago. Has good days and bad days. Doesn't walk with walker or cane. Occasionally 2x/ a week will use  cane at night. Fell 3x last week, 1x this week with current caregiver. Patient has a 24/7 caregiver and never left a long. Walks short distances with hand hold assist.    Limitations Lifting;Standing;Walking;House hold activities   How long can you sit comfortably? n/a   How long can you stand comfortably? short durations   How long can you walk comfortably? 50 ft   Patient Stated Goals to be more independent, do what I want to.    Currently in Pain? No/denies      BERG=24/56, improved from 17/56.  Standing : step forward and then together 10x, pt terminated upon number 6 from fatigue.   Ambulating with Hand hold assist x 120 ft with 2 seated rest breaks, required frequent cueing for task orientation and lifting of feet.   2lb ankle weight kicking soccer ball into knee extension seated position 12x each leg 2lb ankle weight kicking soccer ball through goals when in seated position , knocking over cones occasionally due to difficulty maintaining body alignment. 20x  Seated straight leg abduction RTB 12x each leg RTB resisted knee flexion 12x each leg, required countdown for cueing.    Patient required max verbal cueing for task orientation/sequencing. Counting down out loud to task completion aided patient in performing task.   Pt. response to medical necessity:  Patient will continue to benefit from skilled physical therapy to decrease fall risk and improve quality of  life.       [facility] PT Assessment - 04/07/17 0001      Berg Balance Test   Sit to Stand Able to stand  independently using hands   Standing Unsupported Able to stand 2 minutes with supervision   Sitting with Back Unsupported but Feet Supported on Floor or Stool Able to sit safely and securely 2 minutes   Stand to Sit Uses backs of legs against chair to control descent   Transfers Able to transfer with verbal cueing and /or supervision   Standing Unsupported with Eyes Closed Able to stand 3 seconds   Standing  Ubsupported with Feet Together Needs help to attain position and unable to hold for 15 seconds   From Standing, Reach Forward with Outstretched Arm Can reach forward >5 cm safely (2")   From Standing Position, Pick up Object from Floor Unable to pick up and needs supervision   From Standing Position, Turn to Look Behind Over each Shoulder Looks behind from both sides and weight shifts well   Turn 360 Degrees Needs close supervision or verbal cueing   Standing Unsupported, Alternately Place Feet on Step/Stool Needs assistance to keep from falling or unable to try   Standing Unsupported, One Foot in [place] balance while stepping or standing   Standing on One Leg Unable to try or needs assist to prevent fall   Total Score 24                             PT Education - 04/07/17 1542    Education provided Yes   Education Details exercise biomechanics, ambulation mechanics   Person(s) Educated Patient   Methods Explanation;Demonstration   Comprehension Verbalized understanding;Returned demonstration          PT Short Term Goals - 03/31/17 1404      PT SHORT TERM GOAL #1   Title Patient will static stand for 2 minutes without LOB to improve ability to assist/perform ADLs   Baseline 8/23: unable to static stand for >1 minute 9/19: static stand with UE support for 2 minutes   Time 2   Period Weeks   Status Partially Met     PT SHORT TERM GOAL #2   Title Patient will ambulate with CGA 20 ft without LOB or stopping to improve mobility within home.    Baseline ambulate 10 ft with handhold assist and frequent misteppings and stops, 9/19: 20 ft with CGA and RW   Time 2   Period Weeks   Status Achieved           PT Long Term Goals - 04/07/17 1546      PT LONG TERM GOAL #1   Title Patient will be able to ambulate up to 150 ft with SPC/ least assistive device with SBA and no LOB or knee buckling to promote mobility.   Baseline hand hold assist 10 ft   Time 6    Period Weeks   Status New     PT LONG TERM GOAL #2   Title Patient will increase Berg Balance score by > 6 points (23/56) to demonstrate decreased fall risk during functional activities.   Baseline 8/23: 17/56 9/26: 24/56   Time 6   Period Weeks   Status Achieved     PT LONG TERM GOAL #3   Title Patient will increase 10 meter walk test to >.45ms as to improve gait speed for better community ambulation and  to reduce fall risk.   Baseline 8/23: .57ms 9/19: .526 m/s with RW and CGA   Time 6   Period Weeks   Status Partially Met     PT LONG TERM GOAL #4   Title Patient (> 619years old) will complete five times sit to stand test in < 15 seconds indicating an increased LE strength and improved balance.   Baseline 8/23: 26 seconds 9/19: 26 seconds: pause in center of task due to forgotting #.    Time 6   Period Weeks   Status Partially Met     PT LONG TERM GOAL #5   Title Patient will deny any falls over past 4 weeks to demonstrate improved safety awareness at home and work.    Baseline pt. caregiver report decrease in fall    Time 6   Period Weeks   Status Partially Met               Plan - 04/07/17 1545    Clinical Impression Statement Patient demonstrated poor safety awareness when fatigued and sitting down from ambulating, sitting down at an angle from chair without reaching for chair. Frequent tasks reminder required due to patient current cognitive status. BERG =24/56, demonstrating improvement from previous testing. Patient will continue to benefit from skilled physical therapy to decrease fall risk and improve quality of life.    Rehab Potential Fair   Clinical Impairments Affecting Rehab Potential dementia, hx of stroke, osteoporosis, altered mental status, hx of falls   PT Frequency 2x / week   PT Duration 6 weeks   PT Treatment/Interventions Patient/family education;Therapeutic activities;Therapeutic exercise;Balance training;Neuromuscular  re-education;Functional mobility training;Stair training;Gait training;DME Instruction;Electrical Stimulation;Manual techniques;ADLs/Self Care Home Management;Cryotherapy;Iontophoresis 453mml Dexamethasone;Moist Heat;Orthotic Fit/Training;Passive range of motion;Energy conservation;Vestibular;Visual/perceptual remediation/compensation   PT Next Visit Plan ambulating, standing strengthening, balance   PT Home Exercise Plan see sheet   Consulted and Agree with Plan of Care Patient;Family member/caregiver   Family Member Consulted caregiver      Patient will benefit from skilled therapeutic intervention in order to improve the following deficits and impairments:  Abnormal gait, Decreased balance, Decreased cognition, Decreased knowledge of precautions, Decreased knowledge of use of DME, Decreased safety awareness, Decreased strength, Difficulty walking, Improper body mechanics, Decreased activity tolerance, Decreased endurance, Decreased range of motion, Dizziness, Impaired perceived functional ability, Impaired flexibility, Postural dysfunction  Visit Diagnosis: Unsteadiness on feet  Difficulty in walking, not elsewhere classified  History of falling     Problem List Patient Active Problem List   Diagnosis Date Noted  . Infection of urinary tract 02/21/2016  . Dementia 02/21/2016  . Generalized weakness 02/21/2016  . Slurred speech 02/20/2016  . Depression 02/20/2016  . Broken ribs 08/11/2015  . UTI (lower urinary tract infection) 11/12/2014  . Encephalopathy, metabolic 0568/05/5725. Rheumatoid arthritis ([place]/08/2014  . Chronic anticoagulation 11/12/2014  . Atrial fibrillation ([place]/08/2014  . H/O: CVA (cerebrovascular accident) 11/12/2014   MaJanna ArchPT, DPT   MaJanna Arch/26/2018, 3:47 PM  [place] RETallahas[facility] CenterERVICES [address]: [phone] Fax:  [phone]: BeDONNISHA BESECKERRN: [id number] of Birth: 9/October 09, 1932

## 2017-04-11 ENCOUNTER — Emergency Department: Payer: Medicare Other

## 2017-04-11 ENCOUNTER — Encounter: Payer: Self-pay | Admitting: Emergency Medicine

## 2017-04-11 ENCOUNTER — Inpatient Hospital Stay
Admission: EM | Admit: 2017-04-11 | Discharge: 2017-04-19 | DRG: 481 | Disposition: A | Discharge status: Skilled Nursing Facility | Payer: Medicare Other | Attending: Internal Medicine | Admitting: Internal Medicine

## 2017-04-11 DIAGNOSIS — Z86718 Personal history of other venous thrombosis and embolism: Secondary | ICD-10-CM

## 2017-04-11 DIAGNOSIS — R8290 Unspecified abnormal findings in urine: Secondary | ICD-10-CM | POA: Diagnosis not present

## 2017-04-11 DIAGNOSIS — R32 Unspecified urinary incontinence: Secondary | ICD-10-CM | POA: Diagnosis not present

## 2017-04-11 DIAGNOSIS — I959 Hypotension, unspecified: Secondary | ICD-10-CM | POA: Diagnosis not present

## 2017-04-11 DIAGNOSIS — K5900 Constipation, unspecified: Secondary | ICD-10-CM | POA: Diagnosis not present

## 2017-04-11 DIAGNOSIS — S72141A Displaced intertrochanteric fracture of right femur, initial encounter for closed fracture: Principal | ICD-10-CM | POA: Diagnosis present

## 2017-04-11 DIAGNOSIS — D696 Thrombocytopenia, unspecified: Secondary | ICD-10-CM | POA: Diagnosis present

## 2017-04-11 DIAGNOSIS — M2550 Pain in unspecified joint: Secondary | ICD-10-CM | POA: Diagnosis not present

## 2017-04-11 DIAGNOSIS — W109XXA Fall (on) (from) unspecified stairs and steps, initial encounter: Secondary | ICD-10-CM | POA: Diagnosis present

## 2017-04-11 DIAGNOSIS — R000 Tachycardia, unspecified: Secondary | ICD-10-CM | POA: Diagnosis not present

## 2017-04-11 DIAGNOSIS — E876 Hypokalemia: Secondary | ICD-10-CM | POA: Diagnosis not present

## 2017-04-11 DIAGNOSIS — K219 Gastro-esophageal reflux disease without esophagitis: Secondary | ICD-10-CM | POA: Diagnosis present

## 2017-04-11 DIAGNOSIS — N179 Acute kidney failure, unspecified: Secondary | ICD-10-CM | POA: Diagnosis present

## 2017-04-11 DIAGNOSIS — S0181XA Laceration without foreign body of other part of head, initial encounter: Secondary | ICD-10-CM | POA: Diagnosis present

## 2017-04-11 DIAGNOSIS — Z515 Encounter for palliative care: Secondary | ICD-10-CM | POA: Diagnosis present

## 2017-04-11 DIAGNOSIS — Z419 Encounter for procedure for purposes other than remedying health state, unspecified: Secondary | ICD-10-CM

## 2017-04-11 DIAGNOSIS — D61818 Other pancytopenia: Secondary | ICD-10-CM | POA: Diagnosis present

## 2017-04-11 DIAGNOSIS — I482 Chronic atrial fibrillation: Secondary | ICD-10-CM | POA: Diagnosis present

## 2017-04-11 DIAGNOSIS — Z8673 Personal history of transient ischemic attack (TIA), and cerebral infarction without residual deficits: Secondary | ICD-10-CM

## 2017-04-11 DIAGNOSIS — I340 Nonrheumatic mitral (valve) insufficiency: Secondary | ICD-10-CM | POA: Diagnosis not present

## 2017-04-11 DIAGNOSIS — M069 Rheumatoid arthritis, unspecified: Secondary | ICD-10-CM | POA: Diagnosis present

## 2017-04-11 DIAGNOSIS — D649 Anemia, unspecified: Secondary | ICD-10-CM | POA: Diagnosis not present

## 2017-04-11 DIAGNOSIS — Z23 Encounter for immunization: Secondary | ICD-10-CM | POA: Diagnosis not present

## 2017-04-11 DIAGNOSIS — F0390 Unspecified dementia without behavioral disturbance: Secondary | ICD-10-CM | POA: Diagnosis present

## 2017-04-11 DIAGNOSIS — R5383 Other fatigue: Secondary | ICD-10-CM | POA: Diagnosis not present

## 2017-04-11 DIAGNOSIS — J849 Interstitial pulmonary disease, unspecified: Secondary | ICD-10-CM | POA: Diagnosis present

## 2017-04-11 DIAGNOSIS — S72009A Fracture of unspecified part of neck of unspecified femur, initial encounter for closed fracture: Secondary | ICD-10-CM | POA: Diagnosis not present

## 2017-04-11 DIAGNOSIS — G309 Alzheimer's disease, unspecified: Secondary | ICD-10-CM | POA: Diagnosis present

## 2017-04-11 DIAGNOSIS — F0280 Dementia in other diseases classified elsewhere without behavioral disturbance: Secondary | ICD-10-CM | POA: Diagnosis present

## 2017-04-11 DIAGNOSIS — I480 Paroxysmal atrial fibrillation: Secondary | ICD-10-CM | POA: Diagnosis not present

## 2017-04-11 DIAGNOSIS — R531 Weakness: Secondary | ICD-10-CM | POA: Diagnosis not present

## 2017-04-11 DIAGNOSIS — M810 Age-related osteoporosis without current pathological fracture: Secondary | ICD-10-CM | POA: Diagnosis present

## 2017-04-11 DIAGNOSIS — S72001A Fracture of unspecified part of neck of right femur, initial encounter for closed fracture: Secondary | ICD-10-CM | POA: Diagnosis not present

## 2017-04-11 DIAGNOSIS — D62 Acute posthemorrhagic anemia: Secondary | ICD-10-CM | POA: Diagnosis not present

## 2017-04-11 DIAGNOSIS — M129 Arthropathy, unspecified: Secondary | ICD-10-CM | POA: Diagnosis not present

## 2017-04-11 DIAGNOSIS — R410 Disorientation, unspecified: Secondary | ICD-10-CM | POA: Diagnosis not present

## 2017-04-11 DIAGNOSIS — J984 Other disorders of lung: Secondary | ICD-10-CM | POA: Diagnosis not present

## 2017-04-11 DIAGNOSIS — F329 Major depressive disorder, single episode, unspecified: Secondary | ICD-10-CM | POA: Diagnosis present

## 2017-04-11 DIAGNOSIS — Z7982 Long term (current) use of aspirin: Secondary | ICD-10-CM

## 2017-04-11 DIAGNOSIS — R413 Other amnesia: Secondary | ICD-10-CM | POA: Diagnosis not present

## 2017-04-11 DIAGNOSIS — I4891 Unspecified atrial fibrillation: Secondary | ICD-10-CM | POA: Diagnosis present

## 2017-04-11 LAB — BASIC METABOLIC PANEL
Anion gap: 6 (ref 5–15)
BUN: 19 mg/dL (ref 6–20)
CALCIUM: 8.9 mg/dL (ref 8.9–10.3)
CHLORIDE: 105 mmol/L (ref 101–111)
CO2: 28 mmol/L (ref 22–32)
CREATININE: 1.46 mg/dL — AB (ref 0.44–1.00)
GFR calc Af Amer: 37 mL/min — ABNORMAL LOW (ref >60)
GFR calc non Af Amer: 32 mL/min — ABNORMAL LOW (ref >60)
GLUCOSE: 149 mg/dL — AB (ref 65–99)
Potassium: 3.8 mmol/L (ref 3.5–5.1)
Sodium: 139 mmol/L (ref 135–145)

## 2017-04-11 LAB — HEPATIC FUNCTION PANEL
ALK PHOS: 49 U/L (ref 38–126)
ALT: 9 U/L — ABNORMAL LOW (ref 14–54)
AST: 19 U/L (ref 15–41)
Albumin: 3.5 g/dL (ref 3.5–5.0)
BILIRUBIN DIRECT: 0.1 mg/dL (ref 0.1–0.5)
BILIRUBIN TOTAL: 0.6 mg/dL (ref 0.3–1.2)
Indirect Bilirubin: 0.5 mg/dL (ref 0.3–0.9)
Total Protein: 6.6 g/dL (ref 6.5–8.1)

## 2017-04-11 LAB — CBC WITH DIFFERENTIAL/PLATELET
BASOS PCT: 1 %
Basophils Absolute: 0 10*3/uL (ref 0–0.1)
EOS ABS: 0.1 10*3/uL (ref 0–0.7)
EOS PCT: 1 %
HEMATOCRIT: 37.5 % (ref 35.0–47.0)
Hemoglobin: 12.5 g/dL (ref 12.0–16.0)
Lymphocytes Relative: 19 %
Lymphs Abs: 1.3 10*3/uL (ref 1.0–3.6)
MCH: 32.8 pg (ref 26.0–34.0)
MCHC: 33.3 g/dL (ref 32.0–36.0)
MCV: 98.3 fL (ref 80.0–100.0)
MONOS PCT: 7 %
Monocytes Absolute: 0.4 10*3/uL (ref 0.2–0.9)
NEUTROS PCT: 72 %
Neutro Abs: 4.9 10*3/uL (ref 1.4–6.5)
PLATELETS: 153 10*3/uL (ref 150–440)
RBC: 3.82 MIL/uL (ref 3.80–5.20)
RDW: 15.9 % — AB (ref 11.5–14.5)
WBC: 6.7 10*3/uL (ref 3.6–11.0)

## 2017-04-11 LAB — PROTIME-INR
INR: 1.08
PROTHROMBIN TIME: 13.9 s (ref 11.4–15.2)

## 2017-04-11 MED ORDER — FENTANYL CITRATE (PF) 100 MCG/2ML IJ SOLN
100.0000 ug | Freq: Once | INTRAMUSCULAR | Status: AC
  Administered 2017-04-11: 100 ug via INTRAVENOUS

## 2017-04-11 MED ORDER — TETANUS-DIPHTH-ACELL PERTUSSIS 5-2.5-18.5 LF-MCG/0.5 IM SUSP
0.5000 mL | Freq: Once | INTRAMUSCULAR | Status: AC
  Administered 2017-04-11: 0.5 mL via INTRAMUSCULAR
  Filled 2017-04-11: qty 0.5

## 2017-04-11 MED ORDER — ONDANSETRON HCL 4 MG/2ML IJ SOLN
4.0000 mg | Freq: Once | INTRAMUSCULAR | Status: AC
  Administered 2017-04-11: 4 mg via INTRAVENOUS

## 2017-04-11 MED ORDER — DEXTROSE 5 % IV SOLN
2.0000 g | INTRAVENOUS | Status: DC
  Filled 2017-04-11: qty 20

## 2017-04-11 MED ORDER — LIDOCAINE-EPINEPHRINE 1 %-1:100000 IJ SOLN
10.0000 mL | Freq: Once | INTRAMUSCULAR | Status: AC
  Administered 2017-04-11: 10 mL via INTRADERMAL
  Filled 2017-04-11: qty 10

## 2017-04-11 MED ORDER — ONDANSETRON HCL 4 MG/2ML IJ SOLN
4.0000 mg | Freq: Once | INTRAMUSCULAR | Status: AC
  Administered 2017-04-11: 4 mg via INTRAVENOUS
  Filled 2017-04-11: qty 2

## 2017-04-11 MED ORDER — SODIUM CHLORIDE 0.9 % IV BOLUS (SEPSIS)
1000.0000 mL | Freq: Once | INTRAVENOUS | Status: AC
  Administered 2017-04-11: 1000 mL via INTRAVENOUS

## 2017-04-11 MED ORDER — MORPHINE SULFATE (PF) 4 MG/ML IV SOLN
4.0000 mg | Freq: Once | INTRAVENOUS | Status: AC
  Administered 2017-04-11: 4 mg via INTRAVENOUS
  Filled 2017-04-11: qty 1

## 2017-04-11 MED ORDER — FENTANYL CITRATE (PF) 100 MCG/2ML IJ SOLN
INTRAMUSCULAR | Status: AC
  Administered 2017-04-11: 100 ug via INTRAVENOUS
  Filled 2017-04-11: qty 2

## 2017-04-11 MED ORDER — ONDANSETRON HCL 4 MG/2ML IJ SOLN
INTRAMUSCULAR | Status: AC
Start: 2017-04-11 — End: 2017-04-11
  Administered 2017-04-11: 4 mg via INTRAVENOUS
  Filled 2017-04-11: qty 2

## 2017-04-11 NOTE — ED Provider Notes (Signed)
[facility] Emergency Department Provider Note  ____________________________________________   First MD Initiated Contact with Patient 04/11/17 1937     (approximate)  I have reviewed the triage vital signs and the nursing notes.   HISTORY  Chief Complaint Fall   HPI Kelly Wright is a 83 y.o. female who comes to the emergency department via EMS after mechanical trip and fall down several stairs shortly prior to arrival. She missed a step slipped and fell forward striking her head. She is amnestic to the event. She reports sudden onset severe right hip pain. The pain is worse with movement and somewhat improved with rest. She has no neck pain. No numbness or weakness. She is amnestic to the event. She takes a baby aspirin every day but no other blood thinning medication.   Past Medical History:  Diagnosis Date  . Arthritis   . Atrial fibrillation (HCC)   . Cerebral vascular accident (HCC)   . DVT (deep venous thrombosis) (HCC)   . Dysrhythmia   . GERD (gastroesophageal reflux disease)   . Interstitial lung disease (HCC)   . Osteoporosis     Patient Active Problem List   Diagnosis Date Noted  . Infection of urinary tract 02/21/2016  . Dementia 02/21/2016  . Generalized weakness 02/21/2016  . Slurred speech 02/20/2016  . Depression 02/20/2016  . Broken ribs 08/11/2015  . UTI (lower urinary tract infection) 11/12/2014  . Encephalopathy, metabolic 11/12/2014  . Rheumatoid arthritis (HCC) 11/12/2014  . Chronic anticoagulation 11/12/2014  . Atrial fibrillation (HCC) 11/12/2014  . H/O: CVA (cerebrovascular accident) 11/12/2014    Past Surgical History:  Procedure Laterality Date  . ABDOMINAL HYSTERECTOMY    . BILATERAL CARPAL TUNNEL RELEASE    . EYE SURGERY      Prior to Admission medications   Medication Sig Start Date End Date Taking? Authorizing Provider  aspirin EC 81 MG tablet Take 1 tablet (81 mg total) by mouth daily. 02/22/16    Houston Siren, MD  ciprofloxacin (CIPRO) 250 MG tablet Take 250 mg by mouth 2 (two) times daily.    [provider]  citalopram (CELEXA) 20 MG tablet Take 30 mg by mouth at bedtime.     [provider]  donepezil (ARICEPT) 10 MG tablet Take 10 mg by mouth at bedtime.    [provider]  folic acid (FOLVITE) 400 MCG tablet Take 400 mcg by mouth at bedtime.    [provider]  loratadine (CLARITIN) 10 MG tablet Take 10 mg by mouth daily.    [provider]  memantine (NAMENDA) 10 MG tablet Take 10 mg by mouth 2 (two) times daily.    [provider]  methotrexate (RHEUMATREX) 2.5 MG tablet Take 12.5 mg by mouth every Wednesday.     [provider]    Allergies Patient has no known allergies.  Family History  Problem Relation Age of Onset  . Hodgkin's lymphoma Mother   . Cancer - Lung Father   . CVA Other     Social History Social History  Substance Use Topics  . Smoking status: Never Smoker  . Smokeless tobacco: Never Used  . Alcohol use No    Review of Systems Constitutional: No fever/chills Eyes: No visual changes. ENT: No sore throat. Cardiovascular: Denies chest pain. Respiratory: Denies shortness of breath. Gastrointestinal: No abdominal pain.  No nausea, no vomiting.  No diarrhea.  No constipation. Genitourinary: Negative for dysuria. Musculoskeletal: positive for hip pain Skin: positive  for laceration Neurological: positive for headache   ____________________________________________   PHYSICAL EXAM:  VITAL SIGNS: ED Triage Vitals [04/11/17 1936]  Enc Vitals Group     BP 136/62     Pulse Rate 63     Resp 18     Temp 97.7 F (36.5 C)     Temp Source Oral     SpO2 98 %     Weight 120 lb (54.4 kg)     Height 5\' 4"  (1.626 m)     Head Circumference      Peak Flow      Pain Score      Pain Loc      Pain Edu?      Excl. in GC?     Constitutional: pleasant cooperative appears quite  uncomfortable grimacing in pain Eyes: PERRL EOMI. Head: 2 lacerations just above the right brow. One is 4 cm in the inferior one is 2 cm the superior laceration has galeal involvement Nose: No congestion/rhinnorhea. Mouth/Throat: No trismus Neck: No stridor.   Cardiovascular: Normal rate, regular rhythm. Grossly normal heart sounds.  Good peripheral circulation. Respiratory: Normal respiratory effort.  No retractions. Lungs CTAB and moving good air Gastrointestinal: soft nontender Musculoskeletal: right leg shortened and internally rotated with exquisite discomfort with logroll Neurovascularly intact Neurologic:  Normal speech and language. No gross focal neurologic deficits are appreciated. Skin:  Skin is warm, dry and intact. No rash noted. Psychiatric: Mood and affect are normal. Speech and behavior are normal.    ____________________________________________   DIFFERENTIAL includes but not limited to  mechanical fall, syncope, intracerebral hemorrhage, cervical spine fracture, hip dislocation, hip fracture, laceration ____________________________________________   LABS (all labs ordered are listed, but only abnormal results are displayed)  Labs Reviewed  BASIC METABOLIC PANEL - Abnormal; Notable for the following:       Result Value   Glucose, Bld 149 (*)    Creatinine, Ser 1.46 (*)    GFR calc non Af Amer 32 (*)    GFR calc Af Amer 37 (*)    All other components within normal limits  HEPATIC FUNCTION PANEL - Abnormal; Notable for the following:    ALT 9 (*)    All other components within normal limits  CBC WITH DIFFERENTIAL/PLATELET - Abnormal; Notable for the following:    RDW 15.9 (*)    All other components within normal limits  PROTIME-INR    blood work reviewed and interpreted by me shows chronic kidney disease but no acute change __________________________________________  EKG   ____________________________________________  RADIOLOGY  head CT reviewed  by me shows no acute disease Pelvic x-ray reviewed by me shows displaced right intertrochanteric fracture  ____________________________________________   PROCEDURES  Procedure(s) performed: yes  LACERATION REPAIR Performed by: Merrily Brittle Authorized by: Merrily Brittle Consent: Verbal consent obtained. Risks and benefits: risks, benefits and alternatives were discussed Consent given by: patient Patient identity confirmed: provided demographic data Prepped and Draped in normal sterile fashion Wound explored  Laceration Location: right eyebrow  Laceration Length: 4cm  No Foreign Bodies seen or palpated  Anesthesia: local infiltration  Local anesthetic: lidocaine 1% with epinephrine  Anesthetic total: 3 ml  Irrigation method: syringe Amount of cleaning: standard  Skin closure: 2 layer closure 2 deep sutures with 6-0 fast absorbing gut For superficial simple a rapid sutures with 6-0 proline  Patient tolerance: Patient tolerated the procedure well with no immediate complications.  LACERATION REPAIR Performed by: Merrily Brittle Authorized by: Merrily Brittle Consent: Verbal  consent obtained. Risks and benefits: risks, benefits and alternatives were discussed Consent given by: patient Patient identity confirmed: provided demographic data Prepped and Draped in normal sterile fashion Wound explored  Laceration Location: right eyebrow  Laceration Length: 2cm  No Foreign Bodies seen or palpated  Anesthesia: local infiltration  Local anesthetic: lidocaine 1% with epinephrine  Anesthetic total: 2 ml  Irrigation method: syringe Amount of cleaning: standard  Skin closure: 2 simple interrupted sutures 6-0 proline   Patient tolerance: Patient tolerated the procedure well with no immediate complications.    Procedures  Critical Care performed: no  Observation: no ____________________________________________   INITIAL IMPRESSION / ASSESSMENT AND PLAN /  ED COURSE  Pertinent labs & imaging results that were available during my care of the patient were reviewed by me and considered in my medical decision making (see chart for details).  The patient arrives with obviously shortened and internally rotated right hip although with 2+ dorsalis pedis pulse and neurovascularly intact. She also has obvious right facial trauma. Head CT confirms no acute disease and I was able to repair her 2 wounds in layers with good cosmesis. Her plain view pelvic x-rayshows an intertrochanteric fracture discussed the case with on-call orthopedic surgeon.     ----------------------------------------- 8:26 PM on 04/11/2017 -----------------------------------------  I discussed the case with Dr. Joice Lofts on call for orthopedic surgery who will kindly admit the patient as long as her head CT is negative. ____________________________________________   ----------------------------------------- 9:43 PM on 04/11/2017 -----------------------------------------  Fortunately the patient's head CT is negative. She remains stable for admission to our facility.  FINAL CLINICAL IMPRESSION(S) / ED DIAGNOSES  Final diagnoses:  Closed displaced intertrochanteric fracture of right femur, initial encounter (HCC)      NEW MEDICATIONS STARTED DURING THIS VISIT:  New Prescriptions   No medications on file     Note:  This document was prepared using Dragon voice recognition software and may include unintentional dictation errors.     Merrily Brittle, MD 04/11/17 [id number]

## 2017-04-11 NOTE — H&P (Addendum)
PCP:   Kelly Sato, MD   Chief Complaint:  fall  HPI: This is a 83 year old female with moderate dementia and lives alone. She is around the clock caretakers. Today she went outside and tripped and fell going down a ramp. It was a mechanical fall. She did hit her head. Her caretaker was unable to move her, 911 was called and she was brought to the ER. History provided by the patient's daughters and caretaker present at bedside. Point of contact is Tim at [phone] or Christy at [phone]. The patient is in relatively good health. No significant history of coronary artery disease. No recent CHF. No complaint of chest pains per report  Review of Systems:  The patient denies anorexia, fever, weight loss,, vision loss, decreased hearing, hoarseness, chest pain, syncope, dyspnea on exertion, peripheral edema, balance deficits, hemoptysis, abdominal pain, melena, hematochezia, severe indigestion/heartburn, hematuria, incontinence, genital sores, muscle weakness, suspicious skin lesions, transient blindness, difficulty walking, depression, unusual weight change, abnormal bleeding, enlarged lymph nodes, angioedema, and breast masses. fall  Past Medical History: Past Medical History:  Diagnosis Date  . Arthritis   . Atrial fibrillation (HCC)   . Cerebral vascular accident (HCC)   . DVT (deep venous thrombosis) (HCC)   . Dysrhythmia   . GERD (gastroesophageal reflux disease)   . Interstitial lung disease (HCC)   . Osteoporosis    Past Surgical History:  Procedure Laterality Date  . ABDOMINAL HYSTERECTOMY    . BILATERAL CARPAL TUNNEL RELEASE    . EYE SURGERY      Medications: Prior to Admission medications   Medication Sig Start Date End Date Taking? Authorizing Provider  aspirin EC 81 MG tablet Take 1 tablet (81 mg total) by mouth daily. 02/22/16  Yes Houston Siren, MD  citalopram (CELEXA) 20 MG tablet Take 30 mg by mouth at bedtime.    Yes [provider]  CRANBERRY  CONCENTRATE PO Take 500 mg by mouth daily.    Yes [provider]  folic acid (FOLVITE) 400 MCG tablet Take 400 mcg by mouth at bedtime.   Yes [provider]  methotrexate (RHEUMATREX) 2.5 MG tablet Take 12.5 mg by mouth every Wednesday.    Yes [provider]  ciprofloxacin (CIPRO) 250 MG tablet Take 250 mg by mouth 2 (two) times daily.    [provider]  donepezil (ARICEPT) 10 MG tablet Take 10 mg by mouth at bedtime.    [provider]  loratadine (CLARITIN) 10 MG tablet Take 10 mg by mouth daily.    [provider]  memantine (NAMENDA) 10 MG tablet Take 10 mg by mouth 2 (two) times daily.    [provider]    Allergies:  No Known Allergies  Social History:  reports that she has never smoked. She has never used smokeless tobacco. She reports that she does not drink alcohol or use drugs.  Family History: Family History  Problem Relation Age of Onset  . Hodgkin's lymphoma Mother   . Cancer - Lung Father   . CVA Other     Physical Exam: Vitals:   04/11/17 2130 04/11/17 2200 04/11/17 2230 04/11/17 2300  BP: (!) 121/57 118/70 114/61 129/72  Pulse: 72 62 60 65  Resp: 17 18 17 18   Temp:      TempSrc:      SpO2: 100% 98% 98% 100%  Weight:      Height:        General:  Alert  and oriented times two, well developed and nourished, no acute distress Eyes: PERRLA, pink conjunctiva, no scleral icterus ENT: Moist oral mucosa, neck supple, no thyromegaly, sutured laceration right forehead Lungs: clear to ascultation, no wheeze, no crackles, no use of accessory muscles Cardiovascular: regular rate and rhythm, no regurgitation, no gallops, no murmurs. No carotid bruits, no JVD Abdomen: soft, positive BS, non-tender, non-distended, no organomegaly, not an acute abdomen GU: not examined Neuro: CN II - XII grossly intact, sensation intact Musculoskeletal: , no clubbing, cyanosis or edema, leg not manipulated partially to  fracture Skin: no rash, no subcutaneous crepitation, no decubitus Psych: appropriate patient   Labs on Admission:   Recent Labs  04/11/17 1948  NA 139  K 3.8  CL 105  CO2 28  GLUCOSE 149*  BUN 19  CREATININE 1.46*  CALCIUM 8.9    Recent Labs  04/11/17 1948  AST 19  ALT 9*  ALKPHOS 49  BILITOT 0.6  PROT 6.6  ALBUMIN 3.5   No results for input(s): LIPASE, AMYLASE in the last 72 hours.  Recent Labs  04/11/17 1948  WBC 6.7  NEUTROABS 4.9  HGB 12.5  HCT 37.5  MCV 98.3  PLT 153   No results for input(s): CKTOTAL, CKMB, CKMBINDEX, TROPONINI in the last 72 hours. Invalid input(s): POCBNP No results for input(s): DDIMER in the last 72 hours. No results for input(s): HGBA1C in the last 72 hours. No results for input(s): CHOL, HDL, LDLCALC, TRIG, CHOLHDL, LDLDIRECT in the last 72 hours. No results for input(s): TSH, T4TOTAL, T3FREE, THYROIDAB in the last 72 hours.  Invalid input(s): FREET3 No results for input(s): VITAMINB12, FOLATE, FERRITIN, TIBC, IRON, RETICCTPCT in the last 72 hours.  Micro Results: No results found for this or any previous visit (from the past 240 hour(s)).   Radiological Exams on Admission: Ct Head Wo Contrast  Result Date: 04/11/2017 CLINICAL DATA:  Fall tonight with laceration above right orbit. EXAM: CT HEAD WITHOUT CONTRAST TECHNIQUE: Contiguous axial images were obtained from the base of the skull through the vertex without intravenous contrast. COMPARISON:  08/09/2016 FINDINGS: Brain: The ventricles and cisterns are within normal. There is minimal age related atrophic change. There is mild chronic ischemic microvascular disease. There is no mass, mass effect, shift of midline structures or acute hemorrhage. No evidence of acute infarction. Vascular: No hyperdense vessel or unexpected calcification. Skull: Normal. Negative for fracture or focal lesion. Sinuses/Orbits: No acute finding. Other: None. IMPRESSION: No acute intracranial findings.  Mild chronic ischemic microvascular disease and age related atrophic change. Electronically Signed   By: Elberta Fortis M.D.   On: 04/11/2017 21:36   Dg Pelvis Portable  Result Date: 04/11/2017 CLINICAL DATA:  Right hip pain post fall this evening. EXAM: PORTABLE PELVIS 1-2 VIEWS COMPARISON:  05/15/2011 FINDINGS: Diffuse osteopenia. Mild symmetric degenerative change of the hips. There is a moderately displaced intertrochanteric fracture of the right hip. There is a displaced right lesser trochanter. Mild degenerate change of the symphysis pubis joint. Degenerative change of the spine. IMPRESSION: Displaced right intertrochanteric hip fracture. Electronically Signed   By: Elberta Fortis M.D.   On: 04/11/2017 20:54   Dg Chest Port 1 View  Result Date: 04/11/2017 CLINICAL DATA:  Preop. EXAM: PORTABLE CHEST 1 VIEW COMPARISON:  02/20/2016 FINDINGS: Lungs are adequately inflated with subtle opacification in the left base likely atelectasis. No evidence of effusion. Remainder of the lungs are clear. Cardiomediastinal silhouette is within normal. Multiple old left rib fractures. Remainder of the exam  is unchanged. IMPRESSION: Subtle opacification in the left base likely atelectasis. Electronically Signed   By: Elberta Fortis M.D.   On: 04/11/2017 20:55    Assessment/Plan Present on Admission: . Hip fracture, unspecified laterality, closed, initial encounter (HCC) -Admit to MedSurg -Due to mechanical fall -Orthopedics aware Dr Joice Lofts, will see patient in a.m. -Pain medications when necessary  Acute kidney injury -IV fluid hydration, BMP in a.m.  . Atrial fibrillation (HCC) -Stable, home medications resumed  . Rheumatoid arthritis (HCC) -Stable, home medications resumed  . Moderate Dementia -Stable, home medications resumed   Kelly Wright 04/11/2017, 11:27 PM

## 2017-04-11 NOTE — ED Notes (Signed)
Pt transport to 143

## 2017-04-11 NOTE — ED Triage Notes (Signed)
Pt presents to ED c/o mechanical fall with head involvement. C/o pain to R hip with internal rotation and shortening. 1.5 cm lac noted to R forehead, no bleeding at this time. Pt given fentanyl by EMS PTA. Pt states she doesn't remember falling and thinks she blacked out. Pt's sitter/caregvier states she turned for a moment and pt got up and walked outside without assistance, fell after missing a step.

## 2017-04-11 NOTE — ED Notes (Signed)
Pt place on 2L O2 for RA sat 88% after fentanyl.

## 2017-04-12 ENCOUNTER — Encounter: Payer: Self-pay | Admitting: Emergency Medicine

## 2017-04-12 ENCOUNTER — Inpatient Hospital Stay: Payer: Medicare Other | Admitting: Anesthesiology

## 2017-04-12 ENCOUNTER — Encounter
Admission: EM | Disposition: A | Discharge status: Skilled Nursing Facility | Payer: Self-pay | Source: Home / Self Care | Attending: Internal Medicine

## 2017-04-12 ENCOUNTER — Ambulatory Visit: Payer: Medicare Other

## 2017-04-12 ENCOUNTER — Inpatient Hospital Stay: Payer: Medicare Other

## 2017-04-12 HISTORY — PX: INTRAMEDULLARY (IM) NAIL INTERTROCHANTERIC: SHX5875

## 2017-04-12 LAB — CBC
HCT: 32.4 % — ABNORMAL LOW (ref 35.0–47.0)
Hemoglobin: 11 g/dL — ABNORMAL LOW (ref 12.0–16.0)
MCH: 33.3 pg (ref 26.0–34.0)
MCHC: 33.9 g/dL (ref 32.0–36.0)
MCV: 98.4 fL (ref 80.0–100.0)
PLATELETS: 130 10*3/uL — AB (ref 150–440)
RBC: 3.29 MIL/uL — AB (ref 3.80–5.20)
RDW: 15.5 % — ABNORMAL HIGH (ref 11.5–14.5)
WBC: 9.5 10*3/uL (ref 3.6–11.0)

## 2017-04-12 LAB — BASIC METABOLIC PANEL
ANION GAP: 5 (ref 5–15)
BUN: 19 mg/dL (ref 6–20)
CALCIUM: 7.9 mg/dL — AB (ref 8.9–10.3)
CO2: 27 mmol/L (ref 22–32)
CREATININE: 1 mg/dL (ref 0.44–1.00)
Chloride: 107 mmol/L (ref 101–111)
GFR calc non Af Amer: 51 mL/min — ABNORMAL LOW (ref >60)
GFR, EST AFRICAN AMERICAN: 59 mL/min — AB (ref >60)
Glucose, Bld: 169 mg/dL — ABNORMAL HIGH (ref 65–99)
Potassium: 4.5 mmol/L (ref 3.5–5.1)
SODIUM: 139 mmol/L (ref 135–145)

## 2017-04-12 LAB — MRSA PCR SCREENING: MRSA BY PCR: POSITIVE — AB

## 2017-04-12 SURGERY — FIXATION, FRACTURE, INTERTROCHANTERIC, WITH INTRAMEDULLARY ROD
Anesthesia: Spinal | Site: Hip | Laterality: Right | Wound class: Clean

## 2017-04-12 MED ORDER — EPHEDRINE SULFATE 50 MG/ML IJ SOLN
INTRAMUSCULAR | Status: AC
  Filled 2017-04-12: qty 1

## 2017-04-12 MED ORDER — LIDOCAINE HCL (PF) 2 % IJ SOLN
INTRAMUSCULAR | Status: AC
  Filled 2017-04-12: qty 4

## 2017-04-12 MED ORDER — ACETAMINOPHEN 325 MG PO TABS: 650.0000 mg | ORAL_TABLET | Freq: Four times a day (QID) | ORAL | Status: DC | PRN

## 2017-04-12 MED ORDER — MAGNESIUM HYDROXIDE 400 MG/5ML PO SUSP
30.0000 mL | Freq: Every day | ORAL | Status: DC | PRN
  Administered 2017-04-16: 30 mL via ORAL
  Filled 2017-04-12: qty 30

## 2017-04-12 MED ORDER — FOLIC ACID 0.5 MG HALF TAB
500.0000 ug | ORAL_TABLET | Freq: Every day | ORAL | Status: DC
  Administered 2017-04-12 – 2017-04-18 (×8): 0.5 mg via ORAL
  Filled 2017-04-12 (×9): qty 1

## 2017-04-12 MED ORDER — DIPHENHYDRAMINE HCL 12.5 MG/5ML PO ELIX
12.5000 mg | ORAL_SOLUTION | ORAL | Status: DC | PRN
  Administered 2017-04-13: 12.5 mg via ORAL
  Filled 2017-04-12: qty 5
  Filled 2017-04-12: qty 10

## 2017-04-12 MED ORDER — FENTANYL CITRATE (PF) 100 MCG/2ML IJ SOLN
INTRAMUSCULAR | Status: AC
  Filled 2017-04-12: qty 2

## 2017-04-12 MED ORDER — ONDANSETRON HCL 4 MG/2ML IJ SOLN
INTRAMUSCULAR | Status: DC | PRN
  Administered 2017-04-12: 4 mg via INTRAVENOUS

## 2017-04-12 MED ORDER — FENTANYL CITRATE (PF) 100 MCG/2ML IJ SOLN
INTRAMUSCULAR | Status: DC | PRN
  Administered 2017-04-12: 50 ug via INTRAVENOUS

## 2017-04-12 MED ORDER — CEFAZOLIN SODIUM-DEXTROSE 2-3 GM-% IV SOLR
INTRAVENOUS | Status: DC | PRN
  Administered 2017-04-12: 2 g via INTRAVENOUS

## 2017-04-12 MED ORDER — MEMANTINE HCL 10 MG PO TABS
10.0000 mg | ORAL_TABLET | Freq: Two times a day (BID) | ORAL | Status: DC
  Administered 2017-04-12 – 2017-04-19 (×12): 10 mg via ORAL
  Filled 2017-04-12: qty 1
  Filled 2017-04-12 (×2): qty 2
  Filled 2017-04-12 (×2): qty 1
  Filled 2017-04-12: qty 2
  Filled 2017-04-12 (×4): qty 1
  Filled 2017-04-12 (×2): qty 2
  Filled 2017-04-12: qty 1
  Filled 2017-04-12: qty 2
  Filled 2017-04-12 (×2): qty 1

## 2017-04-12 MED ORDER — CHLORHEXIDINE GLUCONATE CLOTH 2 % EX PADS
6.0000 | MEDICATED_PAD | Freq: Every day | CUTANEOUS | Status: AC
  Administered 2017-04-12 – 2017-04-16 (×4): 6 via TOPICAL

## 2017-04-12 MED ORDER — ONDANSETRON HCL 4 MG/2ML IJ SOLN: 4.0000 mg | Freq: Once | INTRAMUSCULAR | Status: DC | PRN

## 2017-04-12 MED ORDER — ONDANSETRON HCL 4 MG PO TABS
4.0000 mg | ORAL_TABLET | Freq: Four times a day (QID) | ORAL | Status: DC | PRN
  Administered 2017-04-13: 4 mg via ORAL
  Filled 2017-04-12: qty 1

## 2017-04-12 MED ORDER — FLEET ENEMA 7-19 GM/118ML RE ENEM: 1.0000 | ENEMA | Freq: Once | RECTAL | Status: DC | PRN

## 2017-04-12 MED ORDER — BUPIVACAINE-EPINEPHRINE (PF) 0.5% -1:200000 IJ SOLN
INTRAMUSCULAR | Status: AC
  Filled 2017-04-12: qty 30

## 2017-04-12 MED ORDER — BUPIVACAINE-EPINEPHRINE (PF) 0.5% -1:200000 IJ SOLN
INTRAMUSCULAR | Status: DC | PRN
Start: 2017-04-12 — End: 2017-04-12
  Administered 2017-04-12: 30 mL

## 2017-04-12 MED ORDER — NEOMYCIN-POLYMYXIN B GU 40-200000 IR SOLN
Status: DC | PRN
  Administered 2017-04-12: 2 mL

## 2017-04-12 MED ORDER — METHOTREXATE 2.5 MG PO TABS
12.5000 mg | ORAL_TABLET | ORAL | Status: DC
  Administered 2017-04-14: 12.5 mg via ORAL
  Filled 2017-04-12: qty 5

## 2017-04-12 MED ORDER — DEXAMETHASONE SODIUM PHOSPHATE 10 MG/ML IJ SOLN
INTRAMUSCULAR | Status: DC | PRN
  Administered 2017-04-12: 5 mg via INTRAVENOUS

## 2017-04-12 MED ORDER — METOCLOPRAMIDE HCL 10 MG PO TABS
5.0000 mg | ORAL_TABLET | Freq: Three times a day (TID) | ORAL | Status: DC | PRN
  Filled 2017-04-12: qty 1

## 2017-04-12 MED ORDER — ONDANSETRON HCL 4 MG/2ML IJ SOLN
INTRAMUSCULAR | Status: AC
  Filled 2017-04-12: qty 2

## 2017-04-12 MED ORDER — POLYETHYLENE GLYCOL 3350 17 G PO PACK: 17.0000 g | PACK | Freq: Every day | ORAL | Status: DC | PRN

## 2017-04-12 MED ORDER — DEXAMETHASONE SODIUM PHOSPHATE 10 MG/ML IJ SOLN
INTRAMUSCULAR | Status: AC
  Filled 2017-04-12: qty 1

## 2017-04-12 MED ORDER — DEXTROSE 5 % IV SOLN
2.0000 g | Freq: Four times a day (QID) | INTRAVENOUS | Status: AC
  Administered 2017-04-12 – 2017-04-13 (×3): 2 g via INTRAVENOUS
  Filled 2017-04-12 (×3): qty 20

## 2017-04-12 MED ORDER — ONDANSETRON HCL 4 MG/2ML IJ SOLN: 4.0000 mg | Freq: Four times a day (QID) | INTRAMUSCULAR | Status: DC | PRN

## 2017-04-12 MED ORDER — DOCUSATE SODIUM 100 MG PO CAPS
100.0000 mg | ORAL_CAPSULE | Freq: Two times a day (BID) | ORAL | Status: DC
  Administered 2017-04-12 – 2017-04-17 (×10): 100 mg via ORAL
  Filled 2017-04-12 (×10): qty 1

## 2017-04-12 MED ORDER — MORPHINE SULFATE (PF) 2 MG/ML IV SOLN: 1.0000 mg | INTRAVENOUS | Status: DC | PRN

## 2017-04-12 MED ORDER — OXYCODONE HCL 5 MG PO TABS
5.0000 mg | ORAL_TABLET | ORAL | Status: DC | PRN
  Administered 2017-04-12: 5 mg via ORAL
  Filled 2017-04-12: qty 1

## 2017-04-12 MED ORDER — MORPHINE SULFATE (PF) 2 MG/ML IV SOLN: 2.0000 mg | INTRAVENOUS | Status: DC | PRN

## 2017-04-12 MED ORDER — DONEPEZIL HCL 5 MG PO TABS
10.0000 mg | ORAL_TABLET | Freq: Every day | ORAL | Status: DC
  Administered 2017-04-12 – 2017-04-18 (×8): 10 mg via ORAL
  Filled 2017-04-12 (×3): qty 1
  Filled 2017-04-12 (×7): qty 2

## 2017-04-12 MED ORDER — KCL IN DEXTROSE-NACL 20-5-0.9 MEQ/L-%-% IV SOLN
INTRAVENOUS | Status: DC
  Administered 2017-04-12: 18:00:00 via INTRAVENOUS
  Filled 2017-04-12 (×3): qty 1000

## 2017-04-12 MED ORDER — MUPIROCIN 2 % EX OINT
1.0000 "application " | TOPICAL_OINTMENT | Freq: Two times a day (BID) | CUTANEOUS | Status: AC
  Administered 2017-04-12 – 2017-04-16 (×9): 1 via NASAL
  Filled 2017-04-12: qty 22

## 2017-04-12 MED ORDER — BISACODYL 10 MG RE SUPP
10.0000 mg | Freq: Every day | RECTAL | Status: DC | PRN
  Administered 2017-04-15: 10 mg via RECTAL
  Filled 2017-04-12: qty 1

## 2017-04-12 MED ORDER — ACETAMINOPHEN 500 MG PO TABS
1000.0000 mg | ORAL_TABLET | Freq: Four times a day (QID) | ORAL | Status: AC
  Administered 2017-04-12 – 2017-04-13 (×3): 1000 mg via ORAL
  Filled 2017-04-12 (×3): qty 2

## 2017-04-12 MED ORDER — ACETAMINOPHEN 650 MG RE SUPP: 650.0000 mg | Freq: Four times a day (QID) | RECTAL | Status: DC | PRN

## 2017-04-12 MED ORDER — EPHEDRINE SULFATE 50 MG/ML IJ SOLN
INTRAMUSCULAR | Status: AC
  Filled 2017-04-12: qty 2

## 2017-04-12 MED ORDER — LORATADINE 10 MG PO TABS
10.0000 mg | ORAL_TABLET | Freq: Every day | ORAL | Status: DC
Start: 2017-04-12 — End: 2017-04-19
  Administered 2017-04-13 – 2017-04-19 (×7): 10 mg via ORAL
  Filled 2017-04-12 (×7): qty 1

## 2017-04-12 MED ORDER — PANTOPRAZOLE SODIUM 40 MG PO TBEC
40.0000 mg | DELAYED_RELEASE_TABLET | Freq: Every day | ORAL | Status: DC
  Administered 2017-04-13 – 2017-04-14 (×2): 40 mg via ORAL
  Filled 2017-04-12 (×2): qty 1

## 2017-04-12 MED ORDER — EPHEDRINE SULFATE 50 MG/ML IJ SOLN
INTRAMUSCULAR | Status: DC | PRN
  Administered 2017-04-12 (×6): 10 mg via INTRAVENOUS

## 2017-04-12 MED ORDER — LACTATED RINGERS IV SOLN
INTRAVENOUS | Status: DC
  Administered 2017-04-12: 14:00:00 via INTRAVENOUS

## 2017-04-12 MED ORDER — PROPOFOL 500 MG/50ML IV EMUL
INTRAVENOUS | Status: AC
  Filled 2017-04-12: qty 50

## 2017-04-12 MED ORDER — ONDANSETRON HCL 4 MG PO TABS: 4.0000 mg | ORAL_TABLET | Freq: Four times a day (QID) | ORAL | Status: DC | PRN

## 2017-04-12 MED ORDER — POTASSIUM CHLORIDE IN NACL 20-0.9 MEQ/L-% IV SOLN
INTRAVENOUS | Status: DC
  Administered 2017-04-12: 75 mL/h via INTRAVENOUS
  Filled 2017-04-12 (×3): qty 1000

## 2017-04-12 MED ORDER — LIDOCAINE HCL (CARDIAC) 20 MG/ML IV SOLN
INTRAVENOUS | Status: DC | PRN
  Administered 2017-04-12: 40 mg via INTRAVENOUS

## 2017-04-12 MED ORDER — NEOMYCIN-POLYMYXIN B GU 40-200000 IR SOLN
Status: AC
  Filled 2017-04-12: qty 2

## 2017-04-12 MED ORDER — FENTANYL CITRATE (PF) 100 MCG/2ML IJ SOLN: 25.0000 ug | INTRAMUSCULAR | Status: DC | PRN

## 2017-04-12 MED ORDER — ENOXAPARIN SODIUM 40 MG/0.4ML ~~LOC~~ SOLN: 40.0000 mg | SUBCUTANEOUS | Status: DC

## 2017-04-12 MED ORDER — METOCLOPRAMIDE HCL 5 MG/ML IJ SOLN: 5.0000 mg | Freq: Three times a day (TID) | INTRAMUSCULAR | Status: DC | PRN

## 2017-04-12 MED ORDER — CITALOPRAM HYDROBROMIDE 20 MG PO TABS
30.0000 mg | ORAL_TABLET | Freq: Every day | ORAL | Status: DC
  Administered 2017-04-12 – 2017-04-18 (×8): 30 mg via ORAL
  Filled 2017-04-12 (×8): qty 2

## 2017-04-12 MED ORDER — ONDANSETRON HCL 4 MG/2ML IJ SOLN
4.0000 mg | Freq: Four times a day (QID) | INTRAMUSCULAR | Status: DC | PRN
  Administered 2017-04-12: 4 mg via INTRAVENOUS
  Filled 2017-04-12: qty 2

## 2017-04-12 MED ORDER — PROPOFOL 10 MG/ML IV BOLUS
INTRAVENOUS | Status: DC | PRN
  Administered 2017-04-12: 100 mg via INTRAVENOUS

## 2017-04-12 MED ORDER — ENOXAPARIN SODIUM 30 MG/0.3ML ~~LOC~~ SOLN: 30.0000 mg | SUBCUTANEOUS | Status: DC

## 2017-04-12 MED ORDER — ENOXAPARIN SODIUM 40 MG/0.4ML ~~LOC~~ SOLN
40.0000 mg | SUBCUTANEOUS | Status: DC
  Administered 2017-04-13: 40 mg via SUBCUTANEOUS
  Filled 2017-04-12: qty 0.4

## 2017-04-12 SURGICAL SUPPLY — 40 items
BIT DRILL 4.3MMS DISTAL GRDTED (BIT) ×1 IMPLANT
BNDG COHESIVE 4X5 TAN STRL (GAUZE/BANDAGES/DRESSINGS) ×3 IMPLANT
BNDG COHESIVE 6X5 TAN STRL LF (GAUZE/BANDAGES/DRESSINGS) ×3 IMPLANT
CANISTER SUCT 1200ML W/VALVE (MISCELLANEOUS) ×3 IMPLANT
CHLORAPREP W/TINT 26ML (MISCELLANEOUS) ×3 IMPLANT
DRAPE C-ARMOR (DRAPES) ×3 IMPLANT
DRAPE SHEET LG 3/4 BI-LAMINATE (DRAPES) ×3 IMPLANT
DRILL 4.3MMS DISTAL GRADUATED (BIT) ×3
DRSG OPSITE POSTOP 3X4 (GAUZE/BANDAGES/DRESSINGS) ×9 IMPLANT
DRSG OPSITE POSTOP 4X6 (GAUZE/BANDAGES/DRESSINGS) ×3 IMPLANT
ELECT CAUTERY BLADE 6.4 (BLADE) ×3 IMPLANT
ELECT REM PT RETURN 9FT ADLT (ELECTROSURGICAL) ×3
ELECTRODE REM PT RTRN 9FT ADLT (ELECTROSURGICAL) ×1 IMPLANT
GAUZE SPONGE 4X4 12PLY STRL (GAUZE/BANDAGES/DRESSINGS) ×3 IMPLANT
GLOVE BIO SURGEON STRL SZ8 (GLOVE) ×6 IMPLANT
GLOVE INDICATOR 8.0 STRL GRN (GLOVE) ×3 IMPLANT
GOWN STRL REUS W/ TWL LRG LVL3 (GOWN DISPOSABLE) ×1 IMPLANT
GOWN STRL REUS W/ TWL XL LVL3 (GOWN DISPOSABLE) ×1 IMPLANT
GOWN STRL REUS W/TWL LRG LVL3 (GOWN DISPOSABLE) ×2
GOWN STRL REUS W/TWL XL LVL3 (GOWN DISPOSABLE) ×2
GUIDEPIN 3.2X17.5 THRD DISP (PIN) ×3 IMPLANT
GUIDEWIRE BALL NOSE 100CM (WIRE) ×3 IMPLANT
MAT BLUE FLOOR 46X72 FLO (MISCELLANEOUS) ×3 IMPLANT
NAIL HIP FRAC RT 130 11MX400M (Nail) ×3 IMPLANT
NEEDLE FILTER BLUNT 18X 1/2SAF (NEEDLE) ×2
NEEDLE FILTER BLUNT 18X1 1/2 (NEEDLE) ×1 IMPLANT
NEEDLE HYPO 22GX1.5 SAFETY (NEEDLE) ×3 IMPLANT
NS IRRIG 500ML POUR BTL (IV SOLUTION) ×3 IMPLANT
PACK HIP COMPR (MISCELLANEOUS) ×3 IMPLANT
SCREW BONE CORTICAL 5.0X42 (Screw) ×3 IMPLANT
SCREW LAG HIP NAIL 10.5X95 (Screw) ×3 IMPLANT
STAPLER SKIN PROX 35W (STAPLE) ×3 IMPLANT
STRAP SAFETY BODY (MISCELLANEOUS) ×6 IMPLANT
SUT VIC AB 0 CT1 36 (SUTURE) ×3 IMPLANT
SUT VIC AB 1 CT1 36 (SUTURE) ×3 IMPLANT
SUT VIC AB 2-0 CT1 (SUTURE) ×6 IMPLANT
SUT VIC AB 2-0 CT2 27 (SUTURE) ×3 IMPLANT
SYR 30ML LL (SYRINGE) ×3 IMPLANT
SYRINGE 10CC LL (SYRINGE) ×3 IMPLANT
TAPE MICROFOAM 4IN (TAPE) IMPLANT

## 2017-04-12 NOTE — Progress Notes (Signed)
1        [facility] [place] [facility]   PATIENT NAME: Morgyn Marut    MR#:  [id number]  DATE OF BIRTH:  Jul 30, 1932  SUBJECTIVE:  CHIEF COMPLAINT:   Chief Complaint  Patient presents with  . Fall  Complaints of right hip pain REVIEW OF SYSTEMS:  Review of Systems  Constitutional: Positive for malaise/fatigue. Negative for chills, fever and weight loss.  HENT: Negative for nosebleeds and sore throat.   Eyes: Negative for blurred vision.  Respiratory: Negative for cough, shortness of breath and wheezing.   Cardiovascular: Negative for chest pain, orthopnea, leg swelling and PND.  Gastrointestinal: Negative for abdominal pain, constipation, diarrhea, heartburn, nausea and vomiting.  Genitourinary: Negative for dysuria and urgency.  Musculoskeletal: Positive for joint pain. Negative for back pain.  Skin: Negative for rash.  Neurological: Positive for weakness. Negative for dizziness, speech change, focal weakness and headaches.  Endo/Heme/Allergies: Does not bruise/bleed easily.  Psychiatric/Behavioral: Negative for depression.    DRUG ALLERGIES:  No Known Allergies VITALS:  Blood pressure (!) 121/48, pulse 69, temperature 98.1 F (36.7 C), temperature source Oral, resp. rate 18, height 5\' 9"  (1.753 m), weight 60.2 kg (132 lb 12.8 oz), SpO2 100 %. PHYSICAL EXAMINATION:  Physical Exam  Constitutional: She is oriented to person, place, and time and well-developed, well-nourished, and in no distress.  HENT:  Head: Normocephalic and atraumatic.  Eyes: Pupils are equal, round, and reactive to light. Conjunctivae and EOM are normal.  Neck: Normal range of motion. Neck supple. No tracheal deviation present. No thyromegaly present.  Cardiovascular: Normal rate, regular rhythm and normal heart sounds.   Pulmonary/Chest: Effort normal and breath sounds normal. No respiratory distress. She has no wheezes. She exhibits no tenderness.  Abdominal: Soft. Bowel sounds  are normal. She exhibits no distension. There is no tenderness.  Musculoskeletal:       Right hip: She exhibits decreased range of motion, decreased strength, tenderness and bony tenderness.  The right lower extremity is somewhat shortened and externally rotated as compared to the left. The patient has mild tenderness to palpation over the lateral aspect of the hip.  Neurological: She is alert and oriented to person, place, and time. No cranial nerve deficit.  Skin: Skin is warm and dry. No rash noted.  Psychiatric: Mood and affect normal.   LABORATORY PANEL:  Female CBC  Recent Labs Lab 04/12/17 0316  WBC 9.5  HGB 11.0*  HCT 32.4*  PLT 130*   ------------------------------------------------------------------------------------------------------------------ Chemistries   Recent Labs Lab 04/11/17 1948 04/12/17 0316  NA 139 139  K 3.8 4.5  CL 105 107  CO2 28 27  GLUCOSE 149* 169*  BUN 19 19  CREATININE 1.46* 1.00  CALCIUM 8.9 7.9*  AST 19  --   ALT 9*  --   ALKPHOS 49  --   BILITOT 0.6  --    RADIOLOGY:  Ct Head Wo Contrast  Result Date: 04/11/2017 CLINICAL DATA:  Fall tonight with laceration above right orbit. EXAM: CT HEAD WITHOUT CONTRAST TECHNIQUE: Contiguous axial images were obtained from the base of the skull through the vertex without intravenous contrast. COMPARISON:  08/09/2016 FINDINGS: Brain: The ventricles and cisterns are within normal. There is minimal age related atrophic change. There is mild chronic ischemic microvascular disease. There is no mass, mass effect, shift of midline structures or acute hemorrhage. No evidence of acute infarction. Vascular: No hyperdense vessel or unexpected calcification. Skull: Normal. Negative for fracture or focal  lesion. Sinuses/Orbits: No acute finding. Other: None. IMPRESSION: No acute intracranial findings. Mild chronic ischemic microvascular disease and age related atrophic change. Electronically Signed   By: Elberta Fortis  M.D.   On: 04/11/2017 21:36   Dg Pelvis Portable  Result Date: 04/11/2017 CLINICAL DATA:  Right hip pain post fall this evening. EXAM: PORTABLE PELVIS 1-2 VIEWS COMPARISON:  05/15/2011 FINDINGS: Diffuse osteopenia. Mild symmetric degenerative change of the hips. There is a moderately displaced intertrochanteric fracture of the right hip. There is a displaced right lesser trochanter. Mild degenerate change of the symphysis pubis joint. Degenerative change of the spine. IMPRESSION: Displaced right intertrochanteric hip fracture. Electronically Signed   By: Elberta Fortis M.D.   On: 04/11/2017 20:54   Dg Chest Port 1 View  Result Date: 04/11/2017 CLINICAL DATA:  Preop. EXAM: PORTABLE CHEST 1 VIEW COMPARISON:  02/20/2016 FINDINGS: Lungs are adequately inflated with subtle opacification in the left base likely atelectasis. No evidence of effusion. Remainder of the lungs are clear. Cardiomediastinal silhouette is within normal. Multiple old left rib fractures. Remainder of the exam is unchanged. IMPRESSION: Subtle opacification in the left base likely atelectasis. Electronically Signed   By: Elberta Fortis M.D.   On: 04/11/2017 20:55   ASSESSMENT AND PLAN:  83 y.o. female with a history of chronic atrial fibrillation, a history of DVT, gastroesophageal reflux disease, and a CVA who lives independently. Apparently she lost her balance descending several stairs, causing her to land on her right hip. She was brought to the emergency room where x-rays demonstrated a displaced 4 part fracture of the intertrochanteric region of her right hip  *Right hip pain -Due to fracture -Scheduled for surgery tomorrow morning -Further management per orthopedics  *Preop medical clearance -Patient is medically clear for planned surgery -she will be at moderate risk considering advanced age and multiple medical problems including history of CVA and atrial fibrillation -Continue Lovenox  * Acute kidney injury -IV fluid  hydration -Resolved with hydration  . Atrial fibrillation (HCC) -Rate controlled -Do not see any EKG in the chart, will request twelve-lead EKG  . Rheumatoid arthritis (HCC) -Stable, home medications resumed  . Moderate Dementia -Stable, home medications resumed  Physical therapy evaluation post surgery tomorrow   All the records are reviewed and case discussed with Care Management/Social Worker. Management plans discussed with the patient, nursing and they are in agreement.  CODE STATUS: Full Code  TOTAL TIME TAKING CARE OF THIS PATIENT: 35 minutes.   More than 50% of the time was spent in counseling/coordination of care: YES  POSSIBLE D/C IN 2-3 DAYS, DEPENDING ON CLINICAL CONDITION & on orthopedic evaluation   Delfino Lovett M.D on 04/12/2017 at 9:07 AM  Between 7am to 6pm - Pager - [phone]  After 6pm go to [email or web address] - Social research officer, government  [facility] [place] [facility]  Office  [phone]  CC: Primary care physician; Leanna Sato, MD  Note: This dictation was prepared with Dragon dictation along with smaller phrase technology. Any transcriptional errors that result from this process are unintentional.

## 2017-04-12 NOTE — Anesthesia Post-op Follow-up Note (Signed)
Anesthesia QCDR form completed.        

## 2017-04-12 NOTE — Clinical Social Work Note (Signed)
Clinical Social Work Assessment  Patient Details  Name: Kelly Wright MRN: [id number] Date of Birth: October 07, 1932  Date of referral:  04/12/17               Reason for consult:  Facility Placement                Permission sought to share information with:  Chartered certified accountant granted to share information::  Yes, Verbal Permission Granted  Name::      [place]::   [place]   Relationship::     Contact Information:     Housing/Transportation Living arrangements for the past 2 months:  [place] of Information:  Patient, Adult Children, Power of Attorney Patient Interpreter Needed:  None Criminal Activity/Legal Involvement Pertinent to Current Situation/Hospitalization:  No - Comment as needed Significant Relationships:  Adult Children Lives with:  Self Do you feel safe going back to the place where you live?  Yes Need for family participation in patient care:  Yes (Comment)  Care giving concerns:  Patient lives in [place] and has 24/7 care provided by her children.    Social Worker assessment / plan:  Holiday representative (CSW) reviewed chart and noted that patient will have surgery today for a hip fracture. CSW met with patient prior to surgery and her son/ HPOA Tim [phone] and daughter Alyse Low were at bedside. Patient was alert and oriented X4 and was laying in the bed. CSW introduced self and explained role of CSW department. Patient reported that she lives in [place]. Patient's son Octavia Bruckner stated that her children provide 24/7 care and she is never left alone. CSW explained that after surgery PT will evaluate patient and make a recommendation of home health or SNF. Patient reported that she prefers to go home but is open to SNF if needed. CSW explained that medicare requires a 3 night qualifying inpatient stay in a hospital in order to pay for rehab. Patient was admitted to inpatient on 04/11/17.  Patient and son verbalized their understanding and are agreeable to SNF search in [place]. FL2 complete and faxed out. CSW will continue to follow and assist as needed.   Employment status:  Disabled (Comment on whether or not currently receiving Disability), Retired Forensic scientist:  Medicare PT Recommendations:  Not assessed at this time Information / Referral to community resources:  [place]  Patient/Family's Response to care:  Patient prefers to go home but is open to rehab.   Patient/Family's Understanding of and Emotional Response to Diagnosis, Current Treatment, and Prognosis:  Patient and her son were very pleasant and thanked CSW for assistance.   Emotional Assessment Appearance:  Appears stated age Attitude/Demeanor/Rapport:    Affect (typically observed):  Accepting, Adaptable, Pleasant Orientation:  Oriented to Self, Oriented to Place, Oriented to  Time, Oriented to Situation Alcohol / Substance use:  Not Applicable Psych involvement (Current and /or in the community):  No (Comment)  Discharge Needs  Concerns to be addressed:  Discharge Planning Concerns Readmission within the last 30 days:  No Current discharge risk:  Dependent with Mobility Barriers to Discharge:  Continued Medical Work up   [employer], Veronia Beets, LCSW 04/12/2017, 4:18 PM

## 2017-04-12 NOTE — Consult Note (Signed)
ORTHOPAEDIC CONSULTATION  REQUESTING PHYSICIAN: Delfino Lovett, MD  Chief Complaint:   Right hip pain.  History of Present Illness: Kelly Wright is a 83 y.o. female with a history of chronic atrial fibrillation, a history of DVT, gastroesophageal reflux disease, and a CVA who lives independently. Apparently she lost her balance descending several stairs yesterday afternoon, causing her to land on her right hip. She was brought to the emergency room where x-rays demonstrated a displaced 4 part fracture of the intertrochanteric region of her right hip. The patient denies any loss of consciousness, although she didn't strike her head. A superficial laceration on her forehead was irrigated and closed by the ER physician. The patient denies any lightheadedness, dizziness, chest pain, shortness of breath, or other symptoms which may have precipitated her fall.  Past Medical History:  Diagnosis Date  . Arthritis   . Atrial fibrillation (HCC)   . Cerebral vascular accident (HCC)   . DVT (deep venous thrombosis) (HCC)   . Dysrhythmia   . GERD (gastroesophageal reflux disease)   . Interstitial lung disease (HCC)   . Osteoporosis    Past Surgical History:  Procedure Laterality Date  . ABDOMINAL HYSTERECTOMY    . BILATERAL CARPAL TUNNEL RELEASE    . EYE SURGERY     Social History   Social History  . Marital status: Widowed    Spouse name: N/A  . Number of children: N/A  . Years of education: N/A   Social History Main Topics  . Smoking status: Never Smoker  . Smokeless tobacco: Never Used  . Alcohol use No  . Drug use: No  . Sexual activity: Not Asked   Other Topics Concern  . None   Social History Narrative  . None   Family History  Problem Relation Age of Onset  . Hodgkin's lymphoma Mother   . Cancer - Lung Father   . CVA Other    No Known Allergies Prior to Admission medications   Medication Sig Start Date End  Date Taking? Authorizing Provider  aspirin EC 81 MG tablet Take 1 tablet (81 mg total) by mouth daily. 02/22/16  Yes Houston Siren, MD  citalopram (CELEXA) 20 MG tablet Take 30 mg by mouth at bedtime.    Yes [provider]  CRANBERRY CONCENTRATE PO Take 500 mg by mouth daily.    Yes [provider]  folic acid (FOLVITE) 400 MCG tablet Take 400 mcg by mouth at bedtime.   Yes [provider]  methotrexate (RHEUMATREX) 2.5 MG tablet Take 12.5 mg by mouth every Wednesday.    Yes [provider]  ciprofloxacin (CIPRO) 250 MG tablet Take 250 mg by mouth 2 (two) times daily.    [provider]  donepezil (ARICEPT) 10 MG tablet Take 10 mg by mouth at bedtime.    [provider]  loratadine (CLARITIN) 10 MG tablet Take 10 mg by mouth daily.    [provider]  memantine (NAMENDA) 10 MG tablet Take 10 mg by mouth 2 (two) times daily.    [provider]   Ct Head Wo Contrast  Result Date: 04/11/2017 CLINICAL DATA:  Fall tonight with laceration above right orbit. EXAM: CT HEAD WITHOUT CONTRAST TECHNIQUE: Contiguous axial images were obtained from the base of the skull through the vertex without intravenous contrast. COMPARISON:  08/09/2016 FINDINGS: Brain: The ventricles and cisterns are within normal. There is minimal age related atrophic change. There is mild chronic ischemic microvascular disease. There is no  mass, mass effect, shift of midline structures or acute hemorrhage. No evidence of acute infarction. Vascular: No hyperdense vessel or unexpected calcification. Skull: Normal. Negative for fracture or focal lesion. Sinuses/Orbits: No acute finding. Other: None. IMPRESSION: No acute intracranial findings. Mild chronic ischemic microvascular disease and age related atrophic change. Electronically Signed   By: Elberta Fortis M.D.   On: 04/11/2017 21:36   Dg Pelvis Portable  Result Date: 04/11/2017 CLINICAL DATA:  Right hip pain post  fall this evening. EXAM: PORTABLE PELVIS 1-2 VIEWS COMPARISON:  05/15/2011 FINDINGS: Diffuse osteopenia. Mild symmetric degenerative change of the hips. There is a moderately displaced intertrochanteric fracture of the right hip. There is a displaced right lesser trochanter. Mild degenerate change of the symphysis pubis joint. Degenerative change of the spine. IMPRESSION: Displaced right intertrochanteric hip fracture. Electronically Signed   By: Elberta Fortis M.D.   On: 04/11/2017 20:54   Dg Chest Port 1 View  Result Date: 04/11/2017 CLINICAL DATA:  Preop. EXAM: PORTABLE CHEST 1 VIEW COMPARISON:  02/20/2016 FINDINGS: Lungs are adequately inflated with subtle opacification in the left base likely atelectasis. No evidence of effusion. Remainder of the lungs are clear. Cardiomediastinal silhouette is within normal. Multiple old left rib fractures. Remainder of the exam is unchanged. IMPRESSION: Subtle opacification in the left base likely atelectasis. Electronically Signed   By: Elberta Fortis M.D.   On: 04/11/2017 20:55    Positive ROS: All other systems have been reviewed and were otherwise negative with the exception of those mentioned in the HPI and as above.  Physical Exam: General:  Alert, no acute distress Psychiatric:  Patient is competent for consent with normal mood and affect   Cardiovascular:  No pedal edema Respiratory:  No wheezing, non-labored breathing GI:  Abdomen is soft and non-tender Skin:  No lesions in the area of chief complaint Neurologic:  Sensation intact distally Lymphatic:  No axillary or cervical lymphadenopathy  Orthopedic Exam:  Orthopedic examination is limited to the right hip and lower extremity. The right lower extremity is somewhat shortened and externally rotated as compared to the left. Skin inspection around the right hip is unremarkable. There is no evidence for any swelling, erythema, ecchymosis, abrasions, or other skin abnormalities. The patient has mild  tenderness to palpation over the lateral aspect of the hip. She has more significant pain with any attempted active or passive range of motion of the hip. She is able to actively dorsiflex and plantarflex her toes and ankle. Sensation is intact to light touch to all distributions of her right lower extremity. She has good capillary refill to her right foot.  X-rays:  X-rays of the pelvis and right hip are available for review. These films demonstrate a 4 part displaced intertrochanteric fracture of the right hip. No lytic lesions are identified, although there are some mild degenerative changes noted.  Assessment: Closed displaced 4-part intertrochanteric fracture right hip.  Plan: The treatment options have been discussed with the patient, including both surgical and nonsurgical options. The patient elected to proceed with surgical intervention to include intramedullary nailing of the intertrochanteric right hip fracture. This procedure has been discussed in detail with the patient, as have the potential risks (including bleeding, infection, nerve and/or blood vessel injury, persistent or recurrent pain, stiffness of the hip, malunion and/or nonunion, leg length inequality, need for further surgery, blood clots, strokes, heart attacks and/or arrhythmias, etc.) and benefits. The patient states her understanding and wishes to proceed. A formal written consent will be obtained  by the nursing staff.  Thank you for ask me to participate in the care of this most pleasant woman. I will be happy to follow her with you.   Maryagnes Amos, MD  Beeper #:  [phone]  04/12/2017 8:01 AM

## 2017-04-12 NOTE — Progress Notes (Signed)
Enoxaparin renal dose adjustment:  Order for enoxaparin 30 mg subcutaneously daily was increased to 40 mg daily dose per protocol for CrCl > 30 mL/min.  Cindi Carbon, PharmD 04/12/17 8:48 AM

## 2017-04-12 NOTE — Anesthesia Postprocedure Evaluation (Signed)
Anesthesia Post Note  Patient: Kelly Wright  Procedure(s) Performed: INTRAMEDULLARY (IM) NAIL INTERTROCHANTRIC (Right Hip)  Patient location during evaluation: PACU Anesthesia Type: General Level of consciousness: awake and alert and oriented Pain management: pain level controlled Vital Signs Assessment: post-procedure vital signs reviewed and stable Respiratory status: spontaneous breathing Cardiovascular status: blood pressure returned to baseline Anesthetic complications: no     Last Vitals:  Vitals:   04/12/17 1900 04/12/17 1927  BP: (!) 95/43 (!) 110/50  Pulse: 90   Resp: 18   Temp: 37.1 C   SpO2: 94%     Last Pain:  Vitals:   04/12/17 1614  TempSrc:   PainSc: Asleep                 Zafiro Routson

## 2017-04-12 NOTE — Transfer of Care (Signed)
Immediate Anesthesia Transfer of Care Note  Patient: Kelly Wright  Procedure(s) Performed: Procedure(s): INTRAMEDULLARY (IM) NAIL INTERTROCHANTRIC (Right)  Patient Location: PACU  Anesthesia Type:General  Level of Consciousness: sedated  Airway & Oxygen Therapy: Patient Spontanous Breathing and Patient connected to face mask oxygen  Post-op Assessment: Report given to RN and Post -op Vital signs reviewed and stable  Post vital signs: Reviewed and stable  Last Vitals:  Vitals:   04/12/17 1405 04/12/17 1614  BP: 129/60 (!) 123/57  Pulse: 62 86  Resp: 18 16  Temp: (!) 36.3 C (!) 38.2 C  SpO2: 100% 100%    Complications: No apparent anesthesia complications

## 2017-04-12 NOTE — NC FL2 (Signed)
[place] MEDICAID FL2 LEVEL OF CARE SCREENING TOOL     IDENTIFICATION  Patient Name: Kelly Wright Birthdate: 1933/02/01 Sex: female Admission Date (Current Location): 04/11/2017  [place] and [place] Number:  Chiropodist and Address:  [facility], [address] [place], [place] [address]      Provider Number: [id number]  Attending Physician Name and Address:  Delfino Lovett, MD  Relative Name and Phone Number:       Current Level of Care: Hospital Recommended Level of Care: Skilled Nursing Facility Prior Approval Number:    Date Approved/Denied:   PASRR Number:  [id number])  Discharge Plan: SNF    Current Diagnoses: Patient Active Problem List   Diagnosis Date Noted  . Hip fracture, unspecified laterality, closed, initial encounter (HCC) 04/11/2017  . Infection of urinary tract 02/21/2016  . Dementia 02/21/2016  . Generalized weakness 02/21/2016  . Slurred speech 02/20/2016  . Depression 02/20/2016  . Broken ribs 08/11/2015  . UTI (lower urinary tract infection) 11/12/2014  . Encephalopathy, metabolic 11/12/2014  . Rheumatoid arthritis (HCC) 11/12/2014  . Chronic anticoagulation 11/12/2014  . Atrial fibrillation (HCC) 11/12/2014  . H/O: CVA (cerebrovascular accident) 11/12/2014    Orientation RESPIRATION BLADDER Height & Weight     Self, Time, Situation, Place  O2 (2 Liters Oxygen ) Continent Weight: 132 lb (59.9 kg) Height:  5\' 9"  (175.3 cm)  BEHAVIORAL SYMPTOMS/MOOD NEUROLOGICAL BOWEL NUTRITION STATUS      Continent Diet (NPO for surgery to be advanced. )  AMBULATORY STATUS COMMUNICATION OF NEEDS Skin   Extensive Assist Verbally Surgical wounds, Other (Comment) (Incision: Right Hip. Laceration to forehead. )                       Personal Care Assistance Level of Assistance  Bathing, Feeding, Dressing Bathing Assistance: Limited assistance Feeding assistance: Independent Dressing Assistance: Limited assistance      Functional Limitations Info  Sight, Hearing, Speech Sight Info: Adequate Hearing Info: Adequate Speech Info: Adequate    SPECIAL CARE FACTORS FREQUENCY  PT (By licensed PT), OT (By licensed OT)     PT Frequency:  (5) OT Frequency:  (5)            Contractures      Additional Factors Info  Code Status, Allergies, Isolation Precautions Code Status Info:  (Full Code. ) Allergies Info:  (No Known Allergies. )     Isolation Precautions Info:  (MRSA Nasal Swab. )     Current Medications (04/12/2017):  This is the current hospital active medication list Current Facility-Administered Medications  Medication Dose Route Frequency Provider Last Rate Last Dose  . 0.9 % NaCl with KCl 20 mEq/ L  infusion   Intravenous Continuous Crosley, Debby, MD 75 mL/hr at 04/12/17 0138 75 mL/hr at 04/12/17 0138  . [MAR Hold] acetaminophen (TYLENOL) tablet 650 mg  650 mg Oral Q6H PRN 06/12/17, MD       Or  . Gery Pray Hold] acetaminophen (TYLENOL) suppository 650 mg  650 mg Rectal Q6H PRN Crosley, Debby, MD      . Mitzi Hansen Hold] ceFAZolin (ANCEF) 2 g in dextrose 5 % 100 mL IVPB  2 g Intravenous 30 min Pre-Op Poggi, Mitzi Hansen, MD      . Excell Seltzer Hold] Chlorhexidine Gluconate Cloth 2 % PADS 6 each  6 each Topical Q0600 Mitzi Hansen, MD   6 each at 04/12/17 0843  . [MAR Hold] citalopram (CELEXA) tablet 30 mg  30 mg Oral QHS Crosley, Debby, MD   30 mg at 04/12/17 0201  . [MAR Hold] donepezil (ARICEPT) tablet 10 mg  10 mg Oral QHS Crosley, Debby, MD   10 mg at 04/12/17 0202  . [MAR Hold] enoxaparin (LOVENOX) injection 40 mg  40 mg Subcutaneous Q24H Cindi Carbon, [facility]      . fentaNYL (SUBLIMAZE) injection 25 mcg  25 mcg Intravenous Q5 min PRN Darleene Cleaver, Gerrit Heck, MD      . Mitzi Hansen Hold] folic acid (FOLVITE) tablet 0.5 mg  500 mcg Oral QHS Crosley, Debby, MD   0.5 mg at 04/12/17 0202  . lactated ringers infusion   Intravenous Continuous Poggi, Excell Seltzer, MD 75 mL/hr at 04/12/17 1411    . [MAR Hold] loratadine  (CLARITIN) tablet 10 mg  10 mg Oral Daily Gery Pray, MD   Stopped at 04/12/17 1000  . [MAR Hold] memantine (NAMENDA) tablet 10 mg  10 mg Oral BID Gery Pray, MD   Stopped at 04/12/17 1000  . [MAR Hold] methotrexate (RHEUMATREX) tablet 12.5 mg  12.5 mg Oral Q Wed Crosley, Debby, MD      . Mitzi Hansen Hold] morphine 2 MG/ML injection 1 mg  1 mg Intravenous Q4H PRN Gery Pray, MD      . Mitzi Hansen Hold] mupirocin ointment (BACTROBAN) 2 % 1 application  1 application Nasal BID Delfino Lovett, MD   1 application at 04/12/17 1018  . [MAR Hold] ondansetron (ZOFRAN) tablet 4 mg  4 mg Oral Q6H PRN Gery Pray, MD       Or  . Mitzi Hansen Hold] ondansetron (ZOFRAN) injection 4 mg  4 mg Intravenous Q6H PRN Joneen Roach, Debby, MD   4 mg at 04/12/17 3419  . ondansetron (ZOFRAN) injection 4 mg  4 mg Intravenous Once PRN Darleene Cleaver, Gerrit Heck, MD      . Mitzi Hansen Hold] polyethylene glycol (MIRALAX / GLYCOLAX) packet 17 g  17 g Oral Daily PRN Gery Pray, MD       Facility-Administered Medications Ordered in Other Encounters  Medication Dose Route Frequency Provider Last Rate Last Dose  . ceFAZolin (ANCEF) IVPB 2 g/50 mL premix   Intravenous Anesthesia Intra-op Stormy Fabian, CRNA   2 g at 04/12/17 1500  . dexamethasone (DECADRON) injection   Intravenous Anesthesia Intra-op Stormy Fabian, CRNA   5 mg at 04/12/17 1509  . ePHEDrine injection   Intravenous Anesthesia Intra-op Stormy Fabian, CRNA   10 mg at 04/12/17 1550  . fentaNYL (SUBLIMAZE) injection    Anesthesia Intra-op Stormy Fabian, CRNA   50 mcg at 04/12/17 1513  . lidocaine (cardiac) 100 mg/54ml (XYLOCAINE) 20 MG/ML injection 2%   Intravenous Anesthesia Intra-op Stormy Fabian, CRNA   40 mg at 04/12/17 1452  . ondansetron (ZOFRAN) injection   Intravenous Anesthesia Intra-op Stormy Fabian, CRNA   4 mg at 04/12/17 1602  . propofol (DIPRIVAN) 10 mg/mL bolus/IV push    Anesthesia Intra-op Stormy Fabian, CRNA   100 mg at 04/12/17 1452     Discharge  Medications: Please see discharge summary for a list of discharge medications.  Relevant Imaging Results:  Relevant Lab Results:   Additional Information  (SSN: [id number])  Lori Popowski, Darleen Crocker, LCSW

## 2017-04-12 NOTE — Op Note (Addendum)
04/11/2017 - 04/12/2017  4:11 PM  Patient:   Kelly Wright  Pre-Op Diagnosis:   Closed displaced 4-part intertrochanteric fracture, right hip.  Post-Op Diagnosis:   Same  Procedure:   Reduction and internal fixation of displaced intertrochanteric right hip fracture with Biomet Affixis TFN nail.  Surgeon:   Maryagnes Amos, MD  Assistant:   None  Anesthesia:   General LMA  Findings:   As above  Complications:   None  EBL:   100 cc  Fluids:   500 cc crystalloid  UOP:   100 cc  TT:   None  Drains:   None  Closure:   Staples  Implants:   Biomet Affixis 11 x 400 mm TFN with a 95 mm lag screw and a 42 mm distal interlocking screw  Brief Clinical Note:   The patient is an 83 year old female who sustained the above-noted injury last evening when she tripped and fell down several steps. She was brought to the emergency room where x-rays demonstrated the above-noted injury. The patient has been cleared medically and presents at this time for reduction and internal fixation of the right hip fracture.  Procedure:   The patient was brought into the operating room. After adequate general laryngeal mask anesthesia was obtained, the patient was lain in the supine position on the fracture table. The uninjured leg was placed in a flexed and abducted position while the injured lower extremity was placed in longitudinal traction. The fracture was reduced using longitudinal traction and internal rotation. The adequacy of reduction was verified fluoroscopically in AP and lateral projections and found to be near anatomic. The lateral aspects of the right hip and thigh were prepped with ChloraPrep solution before being draped sterilely. Preoperative antibiotics were administered. A timeout was performed to verify the appropriate surgical site. The greater trochanter was identified fluoroscopically and an approximately 3 cm incision made about 2-3 fingerbreadths above the tip of the greater trochanter.  The incision was carried down through the subcutaneous tissues to expose the gluteal fascia. This was split the length of the incision, providing access to the tip of the trochanter. Under fluoroscopic guidance, a guidewire was drilled through the tip of the trochanter into the proximal metaphysis to the level of the lesser trochanter. After verifying its position fluoroscopically in AP and lateral projections, it was overreamed with the initial reamer to the depth of the lesser trochanter. A guidewire was passed down through the femoral canal to the supracondylar region. The adequacy of guidewire position was verified fluoroscopically in AP and lateral projections before the length of the guidewire within the canal was measured and found to be 415 mm. Therefore, a 400 mm length nail was selected. The guidewire was overreamed sequentially using the flexible reamers, beginning with an 11 mm reamer and progressing to a 12.5 mm reamer. This provided good cortical chatter. The 11 x 400 mm Biomet Affixis TFN rod was selected and advanced to the appropriate depth, as verified fluoroscopically.   The guide system for the lag screw was positioned and advanced through an approximately 2 cm stab incision over the lateral aspect of the proximal femur. The guidewire was drilled up through the trochanteric femoral nail and into the femoral neck to rest within 5 mm of subchondral bone. After verifying its position in the femoral neck and head in both AP and lateral projections, the guidewire was measured and found to be optimally replicated by a 95 mm lag screw. The guidewire was overreamed to  the appropriate depth before the lag screw was inserted and advanced to the appropriate depth as verified fluoroscopically in AP and lateral projections. The locking screw was advanced, then backed off a quarter turn to set the lag screw. Again the adequacy of hardware position and fracture reduction was verified fluoroscopically in AP  and lateral projections and found to be excellent.  Attention was directed distally. Using the "perfect circle" technique, the leg and fluoroscopy machine were positioned appropriately. An approximate 1.5 cm stab incision was made over the skin at the appropriate point before the drill bit was advanced through the cortex and across the static hole of the nail. The appropriate length of the screw was determined before the 42 mm distal interlocking screw was positioned, then advanced and tightened securely. Again the adequacy of screw position was verified fluoroscopically in AP and lateral projections and found to be excellent.  The wounds were irrigated thoroughly with sterile saline solution before the abductor fascia was reapproximated using #1 Vicryl interrupted sutures. The subcutaneous tissues were closed using 2-0 Vicryl interrupted sutures. The skin was closed using staples. A total of 30 cc of 0.5% Sensorcaine with epinephrine was injected in and around all incisions. Sterile occlusive dressings were applied to all wounds before the patient was transferred back to his/her hospital bed. The patient was then transferred to the recovery room in satisfactory condition after tolerating the procedure well.

## 2017-04-12 NOTE — Anesthesia Procedure Notes (Signed)
Procedure Name: LMA Insertion Date/Time: 04/12/2017 2:53 PM Performed by: Stormy Fabian Pre-anesthesia Checklist: Patient identified, Patient being monitored, Timeout performed, Emergency Drugs available and Suction available Patient Re-evaluated:Patient Re-evaluated prior to induction Oxygen Delivery Method: Circle system utilized Preoxygenation: Pre-oxygenation with 100% oxygen Induction Type: IV induction Ventilation: Mask ventilation without difficulty LMA: LMA inserted LMA Size: 3.5 Tube type: Oral Number of attempts: 1 Placement Confirmation: positive ETCO2 and breath sounds checked- equal and bilateral Tube secured with: Tape Dental Injury: Teeth and Oropharynx as per pre-operative assessment

## 2017-04-12 NOTE — Anesthesia Preprocedure Evaluation (Signed)
Anesthesia Evaluation  Patient identified by MRN, date of birth, ID band Patient confused    Reviewed: Allergy & Precautions, NPO status   Airway Mallampati: II       Dental  (+) Teeth Intact   Pulmonary neg pulmonary ROS,     + decreased breath sounds      Cardiovascular Exercise Tolerance: Poor + dysrhythmias Atrial Fibrillation  Rhythm:Regular Rate:Normal     Neuro/Psych Depression Alzheimers CVA    GI/Hepatic Neg liver ROS, GERD  Medicated,  Endo/Other  negative endocrine ROSMorbid obesity  Renal/GU negative Renal ROS     Musculoskeletal   Abdominal Normal abdominal exam  (+)   Peds negative pediatric ROS (+)  Hematology   Anesthesia Other Findings   Reproductive/Obstetrics                             Anesthesia Physical Anesthesia Plan  ASA: III  Anesthesia Plan: Spinal   Post-op Pain Management:    Induction:   PONV Risk Score and Plan: 0  Airway Management Planned: Natural Airway and Nasal Cannula  Additional Equipment:   Intra-op Plan:   Post-operative Plan:   Informed Consent: I have reviewed the patients History and Physical, chart, labs and discussed the procedure including the risks, benefits and alternatives for the proposed anesthesia with the patient or authorized representative who has indicated his/her understanding and acceptance.     Plan Discussed with: CRNA  Anesthesia Plan Comments:         Anesthesia Quick Evaluation

## 2017-04-12 NOTE — Clinical Social Work Placement (Signed)
   CLINICAL SOCIAL WORK PLACEMENT  NOTE  Date:  04/12/2017  Patient Details  Name: Kelly Wright MRN: [id number] Date of Birth: [birth year]  Clinical Social Work is seeking post-discharge placement for this patient at the Skilled  Nursing Facility level of care (*CSW will initial, date and re-position this form in  chart as items are completed):  Yes   Patient/family provided with [facility] Clinical Social Work Department's list of facilities offering this level of care within the geographic area requested by the patient (or if unable, by the patient's family).  Yes   Patient/family informed of their freedom to choose among providers that offer the needed level of care, that participate in Medicare, Medicaid or managed care program needed by the patient, have an available bed and are willing to accept the patient.  Yes   Patient/family informed of [facility]'s ownership interest in [facility] and [facility], as well as of the fact that they are under no obligation to receive care at these facilities.  PASRR submitted to EDS on 04/12/17     PASRR number received on 04/12/17     Existing PASRR number confirmed on       FL2 transmitted to all facilities in geographic area requested by pt/family on 04/12/17     FL2 transmitted to all facilities within larger geographic area on       Patient informed that his/her managed care company has contracts with or will negotiate with certain facilities, including the following:            Patient/family informed of bed offers received.  Patient chooses bed at       Physician recommends and patient chooses bed at      Patient to be transferred to   on  .  Patient to be transferred to facility by       Patient family notified on   of transfer.  Name of family member notified:        PHYSICIAN       Additional Comment:    _______________________________________________ Leather Estis, Darleen Crocker, LCSW 04/12/2017, 4:18 PM

## 2017-04-12 NOTE — Progress Notes (Signed)
New Admission Note:   Arrival Method: per stretcher from ED, pt came from home Mental Orientation: alert and oriented to person, place and situation. Pt has history of dementia, can be intermittently confused and forgetful Telemetry: none ordered Assessment: Completed Skin: warm, dry, with laceration noted on the right forehead sustained from the fall, sutured in ED, bruise noted on the right forehead from the fall. Prophylactic sacral foam applied IV: G20 on the left forearm with transparent dressing, intact, flushed. Pain: PAINAD score=1. Pt cannot rate her pain despite education Tubes: O2 inhalation at 2Lpm via nasal cannula-acute Safety Measures: Safety Fall Prevention Plan has been given and discussed Admission: Completed 1A Orientation: Patient has been oriented to the room, unit and staff.  Family: daughter and caregiver at bedside  Orders have been reviewed and implemented. Yellow socks on, fall risk armband applied, call light has been placed within reach and bed alarm activated. Will continue to monitor patient.  Janice Norrie, RN [facility] 1A

## 2017-04-13 ENCOUNTER — Encounter: Payer: Self-pay | Admitting: Surgery

## 2017-04-13 LAB — CBC
HEMATOCRIT: 23.9 % — AB (ref 35.0–47.0)
HEMOGLOBIN: 8.1 g/dL — AB (ref 12.0–16.0)
MCH: 33.3 pg (ref 26.0–34.0)
MCHC: 33.7 g/dL (ref 32.0–36.0)
MCV: 98.8 fL (ref 80.0–100.0)
Platelets: 87 10*3/uL — ABNORMAL LOW (ref 150–440)
RBC: 2.42 MIL/uL — ABNORMAL LOW (ref 3.80–5.20)
RDW: 15.9 % — AB (ref 11.5–14.5)
WBC: 7.5 10*3/uL (ref 3.6–11.0)

## 2017-04-13 LAB — BASIC METABOLIC PANEL
ANION GAP: 4 — AB (ref 5–15)
BUN: 13 mg/dL (ref 6–20)
CALCIUM: 7.9 mg/dL — AB (ref 8.9–10.3)
CHLORIDE: 107 mmol/L (ref 101–111)
CO2: 25 mmol/L (ref 22–32)
Creatinine, Ser: 0.74 mg/dL (ref 0.44–1.00)
GFR calc Af Amer: >60 mL/min (ref >60)
GFR calc non Af Amer: >60 mL/min (ref >60)
GLUCOSE: 177 mg/dL — AB (ref 65–99)
Potassium: 4 mmol/L (ref 3.5–5.1)
Sodium: 136 mmol/L (ref 135–145)

## 2017-04-13 LAB — HEMOGLOBIN: Hemoglobin: 7.2 g/dL — ABNORMAL LOW (ref 12.0–16.0)

## 2017-04-13 MED ORDER — SODIUM CHLORIDE 0.9 % IV BOLUS (SEPSIS)
250.0000 mL | Freq: Once | INTRAVENOUS | Status: AC
Start: 2017-04-13 — End: 2017-04-13
  Administered 2017-04-13: 250 mL via INTRAVENOUS

## 2017-04-13 MED ORDER — IBUPROFEN 400 MG PO TABS: 400.0000 mg | ORAL_TABLET | Freq: Four times a day (QID) | ORAL | Status: DC | PRN

## 2017-04-13 MED ORDER — SODIUM CHLORIDE 0.9 % IV SOLN
INTRAVENOUS | Status: DC
  Administered 2017-04-13: 09:00:00 via INTRAVENOUS
  Administered 2017-04-13: 100 mL/h via INTRAVENOUS
  Administered 2017-04-15 – 2017-04-19 (×7): via INTRAVENOUS

## 2017-04-13 MED ORDER — IBUPROFEN 400 MG PO TABS
400.0000 mg | ORAL_TABLET | Freq: Four times a day (QID) | ORAL | Status: DC
  Administered 2017-04-13 – 2017-04-14 (×5): 400 mg via ORAL
  Filled 2017-04-13 (×5): qty 1

## 2017-04-13 MED ORDER — ENOXAPARIN SODIUM 40 MG/0.4ML ~~LOC~~ SOLN: 40.0000 mg | SUBCUTANEOUS | Status: DC

## 2017-04-13 MED ORDER — ENOXAPARIN SODIUM 40 MG/0.4ML ~~LOC~~ SOLN: 40.0000 mg | Freq: Two times a day (BID) | SUBCUTANEOUS | Status: DC

## 2017-04-13 NOTE — Progress Notes (Signed)
Clinical Social Work (CSW) attempted to contact patient's son Kelly Wright to present bed offers however he did not answer and a voicemail was left. Social work Theatre manager met with patient and a friend and caregiver were at bedside. Social work Theatre manager presented patient bed offers. Patient and caregiver requested that patient's adult children decide on a rehab facility. Caregiver shared that Kelly Wright and his sister should be here later today. CSW contacted patient's daughter Kelly Wright and presented bed offers. Per Kelly Wright she will discuss offers with family and call CSW back.  [name], Social Work Intern [phone]

## 2017-04-13 NOTE — Evaluation (Signed)
Physical Therapy Evaluation Patient Details Name: Kelly Wright MRN: [id number] DOB: [birth year] Today's Date: 04/13/2017   History of Present Illness  83 y/o female who lives at home with 24/7 caregivers.  She had a fall with R hip fx and underwent ORIF 10/1.  History includes Afib, CVA, dementia.  Clinical Impression  Pt showed some effort with PT exam, but was very pain limited and generally not feeling well.  She had some nausea and dry heaving with sitting at EOB and overall was very limited.  She was able to participate with ~15 minutes of LE exercises but again was very pain limited with R hip/LE.  She made an attempt at standing with max assist but overall was unable to achieve standing.     Follow Up Recommendations SNF    Equipment Recommendations       Recommendations for Other Services       Precautions / Restrictions Precautions Precautions: Fall Restrictions Weight Bearing Restrictions: Yes RLE Weight Bearing: Weight bearing as tolerated      Mobility  Bed Mobility Overal bed mobility: Needs Assistance Bed Mobility: Supine to Sit;Sit to Supine     Supine to sit: Max assist Sit to supine: Max assist   General bed mobility comments: Pt unable to initiate movement, needed considerable assist  Transfers Overall transfer level: Needs assistance Equipment used: Rolling walker (2 wheeled) Transfers: Sit to/from Stand           General transfer comment: Pt unable to get to fully upright despite heavy assist.  She had pain, nausea and was unable to even attempt standing a second time, deferred transfer to recliner.  Pt with some dry heaving and dizziness.  Ambulation/Gait                Stairs            Wheelchair Mobility    Modified Rankin (Stroke Patients Only)       Balance Overall balance assessment: Needs assistance Sitting-balance support: Bilateral upper extremity supported Sitting balance-Leahy Scale: Fair       Standing  balance-Leahy Scale: Zero                               Pertinent Vitals/Pain Pain Assessment:  (minimal pain at rest, severe pain with any activity)    Home Living Family/patient expects to be discharged to:: Skilled nursing facility Living Arrangements: Alone Available Help at Discharge: Available 24 hours/day;Personal care attendant   Home Access: Ramped entrance     Home Layout:  (has 1-2 steps t/o the house )        Prior Function Level of Independence: Needs assistance   Gait / Transfers Assistance Needed: Apparently pt can typically walk w/o AD, but caregivers are always with her     Comments: Pt does go out weekly, had been doing outpt PT and getting out more     Hand Dominance        Extremity/Trunk Assessment   Upper Extremity Assessment Upper Extremity Assessment: Generalized weakness (grossly 3-/5 t/o)    Lower Extremity Assessment Lower Extremity Assessment: Generalized weakness (very pain limited on R, no AROM at hip/knee, L LE gt 3+/5)       Communication   Communication:  (pt with mild dementia, able to communicate)  Cognition Arousal/Alertness: Awake/alert Behavior During Therapy: WFL for tasks assessed/performed;Restless Overall Cognitive Status: History of cognitive impairments - at baseline  General Comments      Exercises General Exercises - Lower Extremity Ankle Circles/Pumps: 10 reps;AROM Quad Sets: Strengthening;10 reps Gluteal Sets: Strengthening;10 reps Heel Slides: AAROM;5 reps Hip ABduction/ADduction: AAROM;10 reps   Assessment/Plan    PT Assessment Patient needs continued PT services  PT Problem List Decreased strength;Decreased range of motion;Decreased activity tolerance;Decreased balance;Decreased mobility;Decreased knowledge of use of DME;Decreased safety awareness;Pain       PT Treatment Interventions DME instruction;Gait training;Stair  training;Therapeutic activities;Functional mobility training;Balance training;Therapeutic exercise;Patient/family education    PT Goals (Current goals can be found in the Care Plan section)  Acute Rehab PT Goals Patient Stated Goal: get stronger PT Goal Formulation: With patient Time For Goal Achievement: 04/27/17 Potential to Achieve Goals: Fair    Frequency BID   Barriers to discharge        Co-evaluation               AM-PAC PT "6 Clicks" Daily Activity  Outcome Measure Difficulty turning over in bed (including adjusting bedclothes, sheets and blankets)?: Unable Difficulty moving from lying on back to sitting on the side of the bed? : Unable Difficulty sitting down on and standing up from a chair with arms (e.g., wheelchair, bedside commode, etc,.)?: Unable Help needed moving to and from a bed to chair (including a wheelchair)?: Total Help needed walking in hospital room?: Total Help needed climbing 3-5 steps with a railing? : Total 6 Click Score: 6    End of Session Equipment Utilized During Treatment: Gait belt Activity Tolerance: Patient tolerated treatment well Patient left: with call bell/phone within reach;with bed alarm set;with family/visitor present   PT Visit Diagnosis: Muscle weakness (generalized) (M62.81);Difficulty in walking, not elsewhere classified (R26.2)    Time: 6812-7517 PT Time Calculation (min) (ACUTE ONLY): 44 min   Charges:   PT Evaluation $PT Eval Low Complexity: 1 Low PT Treatments $Therapeutic Exercise: 8-22 mins   PT G Codes:   PT G-Codes **NOT FOR INPATIENT CLASS** Functional Assessment Tool Used: AM-PAC 6 Clicks Basic Mobility Functional Limitation: Mobility: Walking and moving around Mobility: Walking and Moving Around Current Status (G0174): 100 percent impaired, limited or restricted Mobility: Walking and Moving Around Goal Status (B4496): At least 60 percent but less than 80 percent impaired, limited or restricted    Malachi Pro, DPT 04/13/2017, 11:30 AM

## 2017-04-13 NOTE — Progress Notes (Signed)
Physical Therapy Treatment Patient Details Name: Kelly Wright MRN: [id number] DOB: Jul 12, 1933 Today's Date: 04/13/2017    History of Present Illness 83 y/o female who lives at home with 24/7 caregivers.  She had a fall with R hip fx and underwent ORIF 10/1.  History includes Afib, CVA, dementia.    PT Comments    Pt continues to have low BP (89/41), mobility/attempts at standing were deferred today.  Pt continues to have pain but was not nearly as reactive with this as she was this AM.  She showed ability to do some AROM hip abd and also did AROM SAQ as well as some resisted exercises.  She was confused and needed consistent cuing and encouragement but ultimately did show improvement.  Pt will likely continue to have a hard time with mobility and attempts at ambulation (she was recently having outpatient PT) even when vitals are more appropriate to attempt this.    Follow Up Recommendations  SNF     Equipment Recommendations       Recommendations for Other Services       Precautions / Restrictions Precautions Precautions: Fall Restrictions RLE Weight Bearing: Weight bearing as tolerated    Mobility  Bed Mobility               General bed mobility comments: deferred bed mobility secondary to low BP, pt still feeling poorly  Transfers                    Ambulation/Gait                 Stairs            Wheelchair Mobility    Modified Rankin (Stroke Patients Only)       Balance                                            Cognition Arousal/Alertness: Awake/alert Behavior During Therapy: WFL for tasks assessed/performed;Restless Overall Cognitive Status: History of cognitive impairments - at baseline                                        Exercises General Exercises - Lower Extremity Ankle Circles/Pumps: Strengthening;15 reps Quad Sets: Strengthening;15 reps Gluteal Sets: Strengthening;15 reps Short  Arc Quad: AROM;15 reps Heel Slides: AAROM;10 reps Hip ABduction/ADduction: 10 reps;AROM Straight Leg Raises: AAROM;10 reps;AROM (AAROM on R, AROM on L)    General Comments        Pertinent Vitals/Pain Pain Assessment: Faces Faces Pain Scale: Hurts little more (much less severe pain response than this AM)    Home Living                      Prior Function            PT Goals (current goals can now be found in the care plan section) Progress towards PT goals: Progressing toward goals (less pain, more AROM than on exam this AM)    Frequency    BID      PT Plan Current plan remains appropriate    Co-evaluation              AM-PAC PT "6 Clicks" Daily Activity  Outcome Measure  Difficulty turning over in  bed (including adjusting bedclothes, sheets and blankets)?: Unable Difficulty moving from lying on back to sitting on the side of the bed? : Unable Difficulty sitting down on and standing up from a chair with arms (e.g., wheelchair, bedside commode, etc,.)?: Unable Help needed moving to and from a bed to chair (including a wheelchair)?: Total Help needed walking in hospital room?: Total Help needed climbing 3-5 steps with a railing? : Total 6 Click Score: 6    End of Session Equipment Utilized During Treatment: Gait belt Activity Tolerance: Patient tolerated treatment well Patient left: with call bell/phone within reach;with bed alarm set;with family/visitor present   PT Visit Diagnosis: Muscle weakness (generalized) (M62.81);Difficulty in walking, not elsewhere classified (R26.2)     Time: 2956-2130 PT Time Calculation (min) (ACUTE ONLY): 25 min  Charges:  $Therapeutic Exercise: 23-37 mins                    G Codes:       Malachi Pro, DPT 04/13/2017, 3:41 PM

## 2017-04-13 NOTE — Progress Notes (Addendum)
1        [facility] [place] [facility]   PATIENT NAME: Kelly Wright    MR#:  [id number]  DATE OF BIRTH:  May 08, 1933  SUBJECTIVE:  CHIEF COMPLAINT:   Chief Complaint  Patient presents with  . Fall  No complaints, blood pressure is running low, threw up once while I was at bedside REVIEW OF SYSTEMS:  Review of Systems  Constitutional: Positive for malaise/fatigue. Negative for chills, fever and weight loss.  HENT: Negative for nosebleeds and sore throat.   Eyes: Negative for blurred vision.  Respiratory: Negative for cough, shortness of breath and wheezing.   Cardiovascular: Negative for chest pain, orthopnea, leg swelling and PND.  Gastrointestinal: Negative for abdominal pain, constipation, diarrhea, heartburn, nausea and vomiting.  Genitourinary: Negative for dysuria and urgency.  Musculoskeletal: Positive for joint pain. Negative for back pain.  Skin: Negative for rash.  Neurological: Positive for weakness. Negative for dizziness, speech change, focal weakness and headaches.  Endo/Heme/Allergies: Does not bruise/bleed easily.  Psychiatric/Behavioral: Negative for depression.   DRUG ALLERGIES:  No Known Allergies VITALS:  Blood pressure (!) 96/40, pulse 66, temperature 98.3 F (36.8 C), temperature source Oral, resp. rate 18, height 5\' 9"  (1.753 m), weight 59.9 kg (132 lb), SpO2 97 %. PHYSICAL EXAMINATION:  Physical Exam  Constitutional: She is oriented to person, place, and time and well-developed, well-nourished, and in no distress.  HENT:  Head: Normocephalic and atraumatic.  Eyes: Pupils are equal, round, and reactive to light. Conjunctivae and EOM are normal.  Neck: Normal range of motion. Neck supple. No tracheal deviation present. No thyromegaly present.  Cardiovascular: Normal rate, regular rhythm and normal heart sounds.   Pulmonary/Chest: Effort normal and breath sounds normal. No respiratory distress. She has no wheezes. She exhibits no  tenderness.  Abdominal: Soft. Bowel sounds are normal. She exhibits no distension. There is no tenderness.  Musculoskeletal:       Right hip: She exhibits normal range of motion, normal strength, no tenderness and no bony tenderness.  Surgical site dressing looks clean  Neurological: She is alert and oriented to person, place, and time. No cranial nerve deficit.  Skin: Skin is warm and dry. No rash noted.  Psychiatric: Mood and affect normal.   LABORATORY PANEL:  Female CBC  Recent Labs Lab 04/13/17 0347  WBC 7.5  HGB 8.1*  HCT 23.9*  PLT 87*   ------------------------------------------------------------------------------------------------------------------ Chemistries   Recent Labs Lab 04/11/17 1948  04/13/17 0347  NA 139  < > 136  K 3.8  < > 4.0  CL 105  < > 107  CO2 28  < > 25  GLUCOSE 149*  < > 177*  BUN 19  < > 13  CREATININE 1.46*  < > 0.74  CALCIUM 8.9  < > 7.9*  AST 19  --   --   ALT 9*  --   --   ALKPHOS 49  --   --   BILITOT 0.6  --   --   < > = values in this interval not displayed. RADIOLOGY:  Dg Hip Operative Unilat W Or W/o Pelvis Right  Result Date: 04/12/2017 CLINICAL DATA:  Intraoperative fluoro spot series EXAM: OPERATIVE right HIP (WITH PELVIS IF PERFORMED) 6 VIEWS. Fluoro time reported is 58 seconds. TECHNIQUE: Fluoroscopic spot image(s) were submitted for interpretation post-operatively. COMPARISON:  AP pelvis of April 11, 2017 FINDINGS: The patient has undergone inter medullary rod and telescoping screw placement for an intertrochanteric fracture  of the right hip. Alignment is near anatomic. Avulsion of the lesser trochanter is present. The interface of the into medullary rod with the native bone is good. IMPRESSION: Status post ORIF for intertrochanteric fracture of the left hip. Electronically Signed   By: David  [place] M.D.   On: 04/12/2017 15:56   ASSESSMENT AND PLAN:  83 y.o. female with a history of chronic atrial fibrillation, a history of  DVT, gastroesophageal reflux disease, and a CVA who lives independently. Apparently she lost her balance descending several stairs, causing her to land on her right hip. She was brought to the emergency room where x-rays demonstrated a displaced 4 part fracture of the intertrochanteric region of her right hip  *Anemia/pancytopenia- acute on chronic blood loss -Likely hemodilution  -Hold her Lovenox as platelet counts dropped from 130->87 -Some blood loss while at surgery -Hemoglobin dropped from 11.0->8.1 -We will transfuse if hemoglobin drops less than 7.5  *Hypotension -Can be due to blood loss -We will avoid narcotics, use ibuprofen for pain control -Start IV fluids, use bolus as needed  *Right hip pain -Reduction and internal fixation of displaced intertrochanteric right hip fracture on 10/1 -Ibuprofen for pain control -Physical therapy as tolerated  * Acute kidney injury -IV fluid hydration -Resolved with hydration  . History of chronic atrial fibrillation (HCC) -Rate controlled -EKG on this admission shows normal sinus rhythm  . Rheumatoid arthritis (HCC) -Stable, home medications resumed  . Moderate Dementia -Stable, home medications resumed    Physical therapy recommends short-term rehab/skilled nursing facility   All the records are reviewed and case discussed with Care Management/Social Worker. Management plans discussed with the patient, nursing and they are in agreement.  CODE STATUS: Full Code  TOTAL TIME TAKING CARE OF THIS PATIENT: 35 minutes.   More than 50% of the time was spent in counseling/coordination of care: YES  POSSIBLE D/C IN 1-2 DAYS, DEPENDING ON CLINICAL CONDITION & placement   Delfino Lovett M.D on 04/13/2017 at 12:54 PM  Between 7am to 6pm - Pager - [phone]  After 6pm go to [email or web address] - Social research officer, government  [facility]  Office  [phone]  CC: Primary care physician; Leanna Sato, MD  Note:  This dictation was prepared with Dragon dictation along with smaller phrase technology. Any transcriptional errors that result from this process are unintentional.

## 2017-04-13 NOTE — Progress Notes (Signed)
  Subjective: 1 Day Post-Op Procedure(s) (LRB): INTRAMEDULLARY (IM) NAIL INTERTROCHANTRIC (Right) Patient reports pain as moderate.   Patient is well but is hypotensive PT and care management to assist with discharge planning. Negative for chest pain and shortness of breath Fever: no Gastrointestinal:Negative for nausea and vomiting  Objective: Vital signs in last 24 hours: Temp:  [97.3 F (36.3 C)-100.7 F (38.2 C)] 98.3 F (36.8 C) (10/02 0702) Pulse Rate:  [62-119] 66 (10/02 0702) Resp:  [16-19] 18 (10/02 0508) BP: (81-136)/(38-112) 96/40 (10/02 0721) SpO2:  [92 %-100 %] 97 % (10/02 0702) Weight:  [59.9 kg (132 lb)] 59.9 kg (132 lb) (10/01 1405)  Intake/Output from previous day:  Intake/Output Summary (Last 24 hours) at 04/13/17 1020 Last data filed at 04/13/17 0630  Gross per 24 hour  Intake           911.25 ml  Output             1075 ml  Net          -163.75 ml    Intake/Output this shift: No intake/output data recorded.  Labs:  Recent Labs  04/11/17 1948 04/12/17 0316 04/13/17 0347  HGB 12.5 11.0* 8.1*    Recent Labs  04/12/17 0316 04/13/17 0347  WBC 9.5 7.5  RBC 3.29* 2.42*  HCT 32.4* 23.9*  PLT 130* 87*    Recent Labs  04/12/17 0316 04/13/17 0347  NA 139 136  K 4.5 4.0  CL 107 107  CO2 27 25  BUN 19 13  CREATININE 1.00 0.74  GLUCOSE 169* 177*  CALCIUM 7.9* 7.9*    Recent Labs  04/11/17 1948  INR 1.08     EXAM General - Patient is Alert, Appropriate, Lacking and pale this AM Extremity - ABD soft Sensation intact distally Intact pulses distally Dorsiflexion/Plantar flexion intact Incision: dressing C/D/I No cellulitis present Dressing/Incision - clean, dry, no drainage Motor Function - intact, moving foot and toes well on exam.   Abdomen soft on exam. Normal BS.  Past Medical History:  Diagnosis Date  . Arthritis   . Atrial fibrillation (HCC)   . Cerebral vascular accident (HCC)   . DVT (deep venous thrombosis) (HCC)    . Dysrhythmia   . GERD (gastroesophageal reflux disease)   . Interstitial lung disease (HCC)   . Osteoporosis     Assessment/Plan: 1 Day Post-Op Procedure(s) (LRB): INTRAMEDULLARY (IM) NAIL INTERTROCHANTRIC (Right) Active Problems:   Rheumatoid arthritis (HCC)   Atrial fibrillation (HCC)   Dementia   Hip fracture, unspecified laterality, closed, initial encounter (HCC)  Estimated body mass index is 19.49 kg/m as calculated from the following:   Height as of this encounter: 5\' 9"  (1.753 m).   Weight as of this encounter: 59.9 kg (132 lb). Advance diet Up with therapy   Labs reviewed, Hg 8.1, HCT 23.9.  BP 96/40, following bolus. Continue to monitor, check Hg this afternoon, possible transfusion if no improvement and symptomatic.  She denies any dizziness to me today. Begin working on having a BM. Although Hg is low, started on Lovenox today for DVT prophylaxis. CBC and BMP ordered for tomorrow morning.  DVT Prophylaxis - Lovenox, Foot Pumps and TED hose Weight-Bearing as tolerated to right leg  J. , PA-C [facility] Orthopaedic Surgery 04/13/2017, 10:20 AM

## 2017-04-13 NOTE — Progress Notes (Signed)
Social Work Tax inspector contacted patients daughter [name] for decision regarding rehab facility choice. [name] and her siblings have accepted [facility]' offer. Verlon Au, admission coordinator at [facility], is aware of above.  Molli Knock, Social Work Intern [phone]

## 2017-04-13 NOTE — Progress Notes (Signed)
Pt alert to self this shift. Medicated x1 for pain with good results. Iv infusing without difficulty. Mittens applied to hands because pt picking at foley and iv. Surgical dressing dry and intact. Drinking without difficulty. Report given to [facility] RN she will resume care.

## 2017-04-13 NOTE — Progress Notes (Signed)
Care assumed from [facility] at 0300. Pt alert and oriented to self. Vital signs taken this time, BP=81/42. Paged Dr. Tobi Bastos and ordered for NS bolus. Will administer as ordered and continue to monitor.

## 2017-04-13 NOTE — Progress Notes (Signed)
Post bolus vital signs taken. BP=96/51. Pt alert and oriented to self. Will continue to monitor.

## 2017-04-14 ENCOUNTER — Ambulatory Visit: Payer: Medicare Other

## 2017-04-14 DIAGNOSIS — K219 Gastro-esophageal reflux disease without esophagitis: Secondary | ICD-10-CM

## 2017-04-14 DIAGNOSIS — R413 Other amnesia: Secondary | ICD-10-CM

## 2017-04-14 DIAGNOSIS — J984 Other disorders of lung: Secondary | ICD-10-CM

## 2017-04-14 DIAGNOSIS — R410 Disorientation, unspecified: Secondary | ICD-10-CM

## 2017-04-14 DIAGNOSIS — D696 Thrombocytopenia, unspecified: Secondary | ICD-10-CM

## 2017-04-14 DIAGNOSIS — M810 Age-related osteoporosis without current pathological fracture: Secondary | ICD-10-CM

## 2017-04-14 DIAGNOSIS — D62 Acute posthemorrhagic anemia: Secondary | ICD-10-CM

## 2017-04-14 DIAGNOSIS — Z8673 Personal history of transient ischemic attack (TIA), and cerebral infarction without residual deficits: Secondary | ICD-10-CM

## 2017-04-14 DIAGNOSIS — D649 Anemia, unspecified: Secondary | ICD-10-CM

## 2017-04-14 DIAGNOSIS — I480 Paroxysmal atrial fibrillation: Secondary | ICD-10-CM

## 2017-04-14 DIAGNOSIS — R5383 Other fatigue: Secondary | ICD-10-CM

## 2017-04-14 DIAGNOSIS — S72001A Fracture of unspecified part of neck of right femur, initial encounter for closed fracture: Secondary | ICD-10-CM

## 2017-04-14 DIAGNOSIS — R531 Weakness: Secondary | ICD-10-CM

## 2017-04-14 DIAGNOSIS — M129 Arthropathy, unspecified: Secondary | ICD-10-CM

## 2017-04-14 DIAGNOSIS — S72009A Fracture of unspecified part of neck of unspecified femur, initial encounter for closed fracture: Secondary | ICD-10-CM

## 2017-04-14 DIAGNOSIS — M2550 Pain in unspecified joint: Secondary | ICD-10-CM

## 2017-04-14 DIAGNOSIS — Z86718 Personal history of other venous thrombosis and embolism: Secondary | ICD-10-CM

## 2017-04-14 DIAGNOSIS — I4891 Unspecified atrial fibrillation: Secondary | ICD-10-CM

## 2017-04-14 DIAGNOSIS — S72141A Displaced intertrochanteric fracture of right femur, initial encounter for closed fracture: Principal | ICD-10-CM

## 2017-04-14 LAB — FOLATE: FOLATE: 9.3 ng/mL (ref >5.9)

## 2017-04-14 LAB — LACTATE DEHYDROGENASE: LDH: 192 U/L (ref 98–192)

## 2017-04-14 LAB — IRON AND TIBC
IRON: 67 ug/dL (ref 28–170)
Saturation Ratios: 35 % — ABNORMAL HIGH (ref 10.4–31.8)
TIBC: 190 ug/dL — AB (ref 250–450)
UIBC: 123 ug/dL

## 2017-04-14 LAB — PREPARE RBC (CROSSMATCH)

## 2017-04-14 LAB — BASIC METABOLIC PANEL
Anion gap: 2 — ABNORMAL LOW (ref 5–15)
BUN: 15 mg/dL (ref 6–20)
CALCIUM: 7.5 mg/dL — AB (ref 8.9–10.3)
CO2: 26 mmol/L (ref 22–32)
CREATININE: 0.87 mg/dL (ref 0.44–1.00)
Chloride: 113 mmol/L — ABNORMAL HIGH (ref 101–111)
GFR calc non Af Amer: >60 mL/min (ref >60)
Glucose, Bld: 97 mg/dL (ref 65–99)
Potassium: 3.4 mmol/L — ABNORMAL LOW (ref 3.5–5.1)
SODIUM: 141 mmol/L (ref 135–145)

## 2017-04-14 LAB — CBC
HEMATOCRIT: 20.5 % — AB (ref 35.0–47.0)
HEMATOCRIT: 26.1 % — AB (ref 35.0–47.0)
Hemoglobin: 6.8 g/dL — ABNORMAL LOW (ref 12.0–16.0)
Hemoglobin: 8.8 g/dL — ABNORMAL LOW (ref 12.0–16.0)
MCH: 32.5 pg (ref 26.0–34.0)
MCH: 32.1 pg (ref 26.0–34.0)
MCHC: 33.3 g/dL (ref 32.0–36.0)
MCHC: 33.8 g/dL (ref 32.0–36.0)
MCV: 97.7 fL (ref 80.0–100.0)
MCV: 94.9 fL (ref 80.0–100.0)
Platelets: 78 10*3/uL — ABNORMAL LOW (ref 150–440)
Platelets: 91 10*3/uL — ABNORMAL LOW (ref 150–440)
RBC: 2.1 MIL/uL — ABNORMAL LOW (ref 3.80–5.20)
RBC: 2.75 MIL/uL — AB (ref 3.80–5.20)
RDW: 15.9 % — AB (ref 11.5–14.5)
RDW: 18.4 % — ABNORMAL HIGH (ref 11.5–14.5)
WBC: 7.2 10*3/uL (ref 3.6–11.0)
WBC: 7.1 10*3/uL (ref 3.6–11.0)

## 2017-04-14 LAB — HEMOGLOBIN AND HEMATOCRIT, BLOOD
HCT: 23 % — ABNORMAL LOW (ref 35.0–47.0)
Hemoglobin: 7.7 g/dL — ABNORMAL LOW (ref 12.0–16.0)

## 2017-04-14 LAB — GLUCOSE, CAPILLARY: GLUCOSE-CAPILLARY: 89 mg/dL (ref 65–99)

## 2017-04-14 LAB — FERRITIN: FERRITIN: 235 ng/mL (ref 11–307)

## 2017-04-14 LAB — DAT, POLYSPECIFIC AHG (ARMC ONLY): POLYSPECIFIC AHG TEST: NEGATIVE

## 2017-04-14 LAB — MAGNESIUM: Magnesium: 1.8 mg/dL (ref 1.7–2.4)

## 2017-04-14 LAB — ABO/RH: ABO/RH(D): O POS

## 2017-04-14 MED ORDER — PANTOPRAZOLE SODIUM 40 MG PO TBEC
40.0000 mg | DELAYED_RELEASE_TABLET | Freq: Two times a day (BID) | ORAL | Status: DC
  Administered 2017-04-14 – 2017-04-19 (×10): 40 mg via ORAL
  Filled 2017-04-14 (×10): qty 1

## 2017-04-14 MED ORDER — SODIUM CHLORIDE 0.9 % IV BOLUS (SEPSIS)
1000.0000 mL | Freq: Once | INTRAVENOUS | Status: AC
  Administered 2017-04-14: 1000 mL via INTRAVENOUS

## 2017-04-14 MED ORDER — SODIUM CHLORIDE 0.9 % IV SOLN: Freq: Once | INTRAVENOUS | Status: DC

## 2017-04-14 MED ORDER — SODIUM CHLORIDE 0.9 % IV SOLN: 250.0000 mL | INTRAVENOUS | Status: DC | PRN

## 2017-04-14 MED ORDER — HYDROCODONE-ACETAMINOPHEN 5-325 MG PO TABS
1.0000 | ORAL_TABLET | Freq: Four times a day (QID) | ORAL | Status: DC | PRN
  Administered 2017-04-14 – 2017-04-18 (×3): 1 via ORAL
  Filled 2017-04-14 (×3): qty 1

## 2017-04-14 MED ORDER — POLYETHYLENE GLYCOL 3350 17 G PO PACK
17.0000 g | PACK | Freq: Two times a day (BID) | ORAL | Status: DC
  Administered 2017-04-14 – 2017-04-17 (×6): 17 g via ORAL
  Filled 2017-04-14 (×6): qty 1

## 2017-04-14 MED ORDER — SODIUM CHLORIDE 0.9% FLUSH
3.0000 mL | Freq: Two times a day (BID) | INTRAVENOUS | Status: DC
  Administered 2017-04-14: 3 mL via INTRAVENOUS

## 2017-04-14 MED ORDER — CYCLOBENZAPRINE HCL 10 MG PO TABS
5.0000 mg | ORAL_TABLET | Freq: Three times a day (TID) | ORAL | Status: DC | PRN
  Filled 2017-04-14: qty 1

## 2017-04-14 MED ORDER — PNEUMOCOCCAL VAC POLYVALENT 25 MCG/0.5ML IJ INJ
0.5000 mL | INJECTION | INTRAMUSCULAR | Status: AC
  Administered 2017-04-16: 0.5 mL via INTRAMUSCULAR
  Filled 2017-04-14: qty 0.5

## 2017-04-14 MED ORDER — POTASSIUM CHLORIDE 20 MEQ PO PACK
20.0000 meq | PACK | Freq: Two times a day (BID) | ORAL | Status: AC
  Administered 2017-04-14 (×2): 20 meq via ORAL
  Filled 2017-04-14: qty 1

## 2017-04-14 MED ORDER — SODIUM CHLORIDE 0.9% FLUSH: 3.0000 mL | INTRAVENOUS | Status: DC | PRN

## 2017-04-14 MED ORDER — ACETAMINOPHEN 500 MG PO TABS
500.0000 mg | ORAL_TABLET | Freq: Four times a day (QID) | ORAL | Status: DC
  Administered 2017-04-15 – 2017-04-19 (×17): 500 mg via ORAL
  Filled 2017-04-14 (×19): qty 1

## 2017-04-14 NOTE — Progress Notes (Signed)
  Subjective: 2 Days Post-Op Procedure(s) (LRB): INTRAMEDULLARY (IM) NAIL INTERTROCHANTRIC (Right) Patient reports pain as mild.   Patient is well but Hg 6.8, internal medicine has already ordered one unit of PRBC. Plan will be for discharge to [facility] when medically appropriate. Negative for chest pain and shortness of breath Fever: no Gastrointestinal:Negative for nausea and vomiting  Objective: Vital signs in last 24 hours: Temp:  [98.1 F (36.7 C)-98.6 F (37 C)] 98.6 F (37 C) (10/03 0715) Pulse Rate:  [68-124] 124 (10/03 0715) Resp:  [16-18] 18 (10/02 1930) BP: (89-119)/(39-62) 119/62 (10/03 0715) SpO2:  [95 %-100 %] 95 % (10/03 0715)  Intake/Output from previous day:  Intake/Output Summary (Last 24 hours) at 04/14/17 0755 Last data filed at 04/14/17 0636  Gross per 24 hour  Intake          2178.34 ml  Output              590 ml  Net          1588.34 ml    Intake/Output this shift: No intake/output data recorded.  Labs:  Recent Labs  04/11/17 1948 04/12/17 0316 04/13/17 0347 04/13/17 1606 04/14/17 0403  HGB 12.5 11.0* 8.1* 7.2* 6.8*    Recent Labs  04/13/17 0347 04/14/17 0403  WBC 7.5 7.2  RBC 2.42* 2.10*  HCT 23.9* 20.5*  PLT 87* 78*    Recent Labs  04/13/17 0347 04/14/17 0403  NA 136 141  K 4.0 3.4*  CL 107 113*  CO2 25 26  BUN 13 15  CREATININE 0.74 0.87  GLUCOSE 177* 97  CALCIUM 7.9* 7.5*    Recent Labs  04/11/17 1948  INR 1.08     EXAM General - Patient is Alert, Appropriate, Lacking and pale this AM Extremity - ABD soft Sensation intact distally Intact pulses distally Dorsiflexion/Plantar flexion intact Incision: dressing C/D/I No cellulitis present Dressing/Incision - clean, dry, no drainage Motor Function - intact, moving foot and toes well on exam.   Abdomen soft on exam. Normal BS.  Past Medical History:  Diagnosis Date  . Arthritis   . Atrial fibrillation (HCC)   . Cerebral vascular accident (HCC)   . DVT (deep  venous thrombosis) (HCC)   . Dysrhythmia   . GERD (gastroesophageal reflux disease)   . Interstitial lung disease (HCC)   . Osteoporosis     Assessment/Plan: 2 Days Post-Op Procedure(s) (LRB): INTRAMEDULLARY (IM) NAIL INTERTROCHANTRIC (Right) Active Problems:   Rheumatoid arthritis (HCC)   Atrial fibrillation (HCC)   Dementia   Hip fracture, unspecified laterality, closed, initial encounter (HCC)  Estimated body mass index is 19.49 kg/m as calculated from the following:   Height as of this encounter: 5\' 9"  (1.753 m).   Weight as of this encounter: 59.9 kg (132 lb). Advance diet Up with therapy   Labs reviewed, Hg 6.8, HCT 20.5.  BP 119/62 and HR 124. Plan is to transfuse 1 unit of PRBC today. Begin working on having a BM. K+ 3.4, will supplement CBC and BMP ordered for tomorrow morning.  DVT Prophylaxis - Lovenox, Foot Pumps and TED hose Weight-Bearing as tolerated to right leg  J. , PA-C [facility] Orthopaedic Surgery 04/14/2017, 7:55 AM

## 2017-04-14 NOTE — Progress Notes (Addendum)
1        [employer] -  at [facility]   PATIENT NAME: Kelly Wright    MR#:  [id number]  DATE OF BIRTH:  07-Nov-1932  SUBJECTIVE:  CHIEF COMPLAINT:   Chief Complaint  Patient presents with  . Fall  Hb 6.8, now Hypotensive/tachycardic, not feeling well, seems somewhat confused REVIEW OF SYSTEMS:  Review of Systems  Constitutional: Positive for malaise/fatigue. Negative for chills, fever and weight loss.  HENT: Negative for nosebleeds and sore throat.   Eyes: Negative for blurred vision.  Respiratory: Negative for cough, shortness of breath and wheezing.   Cardiovascular: Negative for chest pain, orthopnea, leg swelling and PND.  Gastrointestinal: Negative for abdominal pain, constipation, diarrhea, heartburn, nausea and vomiting.  Genitourinary: Negative for dysuria and urgency.  Musculoskeletal: Positive for joint pain. Negative for back pain.  Skin: Negative for rash.  Neurological: Positive for weakness. Negative for dizziness, speech change, focal weakness and headaches.  Endo/Heme/Allergies: Does not bruise/bleed easily.  Psychiatric/Behavioral: Negative for depression.   DRUG ALLERGIES:  No Known Allergies VITALS:  Blood pressure (!) 92/51, pulse (!) 124, temperature 98.5 F (36.9 C), temperature source Oral, resp. rate 18, height 5\' 9"  (1.753 m), weight 59.9 kg (132 lb), SpO2 98 %. PHYSICAL EXAMINATION:  Physical Exam  Constitutional: She is well-developed, well-nourished, and in no distress.  HENT:  Head: Normocephalic and atraumatic.  Eyes: Pupils are equal, round, and reactive to light. Conjunctivae and EOM are normal.  Neck: Normal range of motion. Neck supple. No tracheal deviation present. No thyromegaly present.  Cardiovascular: Normal rate, regular rhythm and normal heart sounds.   Pulmonary/Chest: Effort normal and breath sounds normal. No respiratory distress. She has no wheezes. She exhibits no tenderness.  Abdominal: Soft. Bowel sounds  are normal. She exhibits no distension. There is no tenderness.  Musculoskeletal:       Right hip: She exhibits normal range of motion, normal strength, no tenderness and no bony tenderness.  Surgical site dressing looks clean  Neurological: She is alert. No cranial nerve deficit.  confused  Skin: Skin is warm and dry. No rash noted.  Psychiatric: Mood and affect normal.   LABORATORY PANEL:  Female CBC  Recent Labs Lab 04/14/17 0403  WBC 7.2  HGB 6.8*  HCT 20.5*  PLT 78*   ------------------------------------------------------------------------------------------------------------------ Chemistries   Recent Labs Lab 04/11/17 1948  04/14/17 0403  NA 139  < > 141  K 3.8  < > 3.4*  CL 105  < > 113*  CO2 28  < > 26  GLUCOSE 149*  < > 97  BUN 19  < > 15  CREATININE 1.46*  < > 0.87  CALCIUM 8.9  < > 7.5*  MG  --   --  1.8  AST 19  --   --   ALT 9*  --   --   ALKPHOS 49  --   --   BILITOT 0.6  --   --   < > = values in this interval not displayed. RADIOLOGY:  No results found. ASSESSMENT AND PLAN:  83 y.o. female with a history of chronic atrial fibrillation, a history of DVT, gastroesophageal reflux disease, and a CVA who lives independently. Apparently she lost her balance descending several stairs, causing her to land on her right hip. She was brought to the emergency room where x-rays demonstrated a displaced 4 part fracture of the intertrochanteric region of her right hip  *Anemia/pancytopenia- acute on chronic blood loss -  Possible hemodilution -  -Hold her Lovenox as platelet counts dropped from 130->87->78 - HIT? Stop Lovenox - Ibuprofen,  -Some blood loss while at surgery -Hemoglobin dropped from 11.0->8.1->6.8 - transfuse one PRBC - check H & H Q 8 hrs - Daughter reports h/o stomach ulcers today - will stop ibuprofen (started y'day) as she was getting confused with Narcotics - c/s Onco (r/o HIT? Underlying hematologic malignancy), and GI - check HIT panel - has  h/o DVT, A.fib and CVA - high risk for blood clots  *Hypotension/Tachycardia -Can be due to blood loss -Avoid narcotics -continue IV fluids, may need pressors  *Right hip pain -Reduction and internal fixation of displaced intertrochanteric right hip fracture on 10/1 -Physical therapy as tolerated - pain control  * Hypokalemia - replete and recheck  * Acute kidney injury -IV fluid hydration -Resolved with hydration  . History of chronic atrial fibrillation (HCC) -Rate controlled -EKG on this admission shows normal sinus rhythm - not a good candidate for anticoagulation  . Rheumatoid arthritis (HCC) -Stable  . Moderate Dementia -Stable   She looks critically sick and High risk for cardio-resp arrest and death.  Overall poor prognosis (had long d/w daughter Kelly Wright over phone) - c/s palliative care   Physical therapy recommends short-term rehab/skilled nursing facility   All the records are reviewed and case discussed with Care Management/Social Worker. Management plans discussed with the patient, nursing (with daughter Kelly Wright over phone), Dr Belia Heman and they are in agreement.  CODE STATUS: Full Code  TOTAL TIME (CRITICAL CARE) TAKING CARE OF THIS PATIENT: 35 minutes.   More than 50% of the time was spent in counseling/coordination of care: YES  POSSIBLE D/C IN 1-2 DAYS, DEPENDING ON CLINICAL CONDITION & placement   Delfino Lovett M.D on 04/14/2017 at 10:23 AM  Between 7am to 6pm - Pager - [phone]  After 6pm go to [email or web address] - Social research officer, government  [employer] [facility] [place] [facility]  Office  [phone]  CC: Primary care physician; Leanna Sato, MD  Note: This dictation was prepared with Dragon dictation along with smaller phrase technology. Any transcriptional errors that result from this process are unintentional.

## 2017-04-14 NOTE — Progress Notes (Signed)
Pt transferred to room 151.  Pt alert, no complaints of pain. Caregiver went to room 151 to wait for pt.  Report given to Miranda, rn.

## 2017-04-14 NOTE — Consult Note (Signed)
Consultation Note Date: 04/14/2017   Patient Name: Kelly Wright  DOB: Nov 14, 1932  MRN: [id number]  Age / Sex: 83 y.o., female  PCP: Leanna Sato, MD Referring Physician: Delfino Lovett, MD  Reason for Consultation: Establishing goals of care  HPI/Patient Profile:  83 y.o.femalewho is retired from [employer] with a history of  atrial fibrillation, a history of DVT, gastroesophageal reflux disease, and a CVA who lives at home with around the clock care provider. She has a daughter and son. Kelly Wright's daughter Kelly Wright)  is present at bedside.   Clinical Assessment and Goals of Care: Kelly Wright is post op day 2 s/p ORIF with IMN of right hip. She is alert and answering some questions accurately, she has dementia at baseline. 1 unit PRBCs for HGB 6.8.   Kelly Wright denies pain or discomfort. Her daughter Kelly Wright) states she is doing better since the transfusion. Kelly Wright states Kelly Wright blood pressure usually runs around 100 systolic. Her daughter is a Engineer, civil (consulting) for [employer].   Upon discussing goals of care, she states they were just asked about a living will this morning and that her mother wants to be a full code. She states "she will tell me what she wants", and states "we will need to see how things go, and then go from there".   Daughter Kelly Wright is present in room, she states she and her brother Kelly Wright sign papers for Kelly Wright and are the contact people.   Family is concerned about resuming anticoagulation ASAP as she has hx of DVT. They are also concerned with getting her out of bed and having her be more active.      SUMMARY OF RECOMMENDATIONS   Continue current level of care, PT/OT.      PCM will continue to follow.    Code Status/Advance Care Planning:  Full code    Symptom Management:   Patient is unable to use NSAIDS at this time.    Norco 5/325 q 6 PRN for moderate or severe  pain  Flexeril 5 mg TID PRN for muscle spams   Tylenol 500mg  q6 scheduled for pain   Palliative Prophylaxis:   Frequent Pain Assessment    Prognosis:   Unable to determine Depending on recovery and oral intake.   Discharge Planning: To Be Determined      Primary Diagnoses: Present on Admission: . Hip fracture, unspecified laterality, closed, initial encounter (HCC) . Atrial fibrillation (HCC) . Rheumatoid arthritis (HCC) . Dementia   I have reviewed the medical record, interviewed the patient and family, and examined the patient. The following aspects are pertinent.  Past Medical History:  Diagnosis Date  . Arthritis   . Atrial fibrillation (HCC)   . Cerebral vascular accident (HCC)   . DVT (deep venous thrombosis) (HCC)   . Dysrhythmia   . GERD (gastroesophageal reflux disease)   . Interstitial lung disease (HCC)   . Osteoporosis    Social History   Social History  . Marital status: Widowed  Spouse name: N/A  . Number of children: N/A  . Years of education: N/A   Social History Main Topics  . Smoking status: Never Smoker  . Smokeless tobacco: Never Used  . Alcohol use No  . Drug use: No  . Sexual activity: Not Asked   Other Topics Concern  . None   Social History Narrative  . None   Family History  Problem Relation Age of Onset  . Hodgkin's lymphoma Mother   . Cancer - Lung Father   . CVA Other    Scheduled Meds: . Chlorhexidine Gluconate Cloth  6 each Topical Q0600  . citalopram  30 mg Oral QHS  . docusate sodium  100 mg Oral BID  . donepezil  10 mg Oral QHS  . folic acid  500 mcg Oral QHS  . loratadine  10 mg Oral Daily  . memantine  10 mg Oral BID  . methotrexate  12.5 mg Oral Q Wed  . mupirocin ointment  1 application Nasal BID  . pantoprazole  40 mg Oral BID  . [START ON 04/15/2017] pneumococcal 23 valent vaccine  0.5 mL Intramuscular Tomorrow-1000  . sodium chloride flush  3 mL Intravenous Q12H   Continuous Infusions: . sodium  chloride 100 mL/hr at 04/14/17 1400  . sodium chloride    . sodium chloride    .  ceFAZolin (ANCEF) IV     PRN Meds:.sodium chloride, acetaminophen **OR** acetaminophen, bisacodyl, diphenhydrAMINE, magnesium hydroxide, metoCLOPramide **OR** metoCLOPramide (REGLAN) injection, ondansetron **OR** ondansetron (ZOFRAN) IV, polyethylene glycol, sodium chloride flush, sodium phosphate Medications Prior to Admission:  Prior to Admission medications   Medication Sig Start Date End Date Taking? Authorizing Provider  aspirin EC 81 MG tablet Take 1 tablet (81 mg total) by mouth daily. 02/22/16  Yes Houston Siren, MD  citalopram (CELEXA) 20 MG tablet Take 30 mg by mouth at bedtime.    Yes [provider]  CRANBERRY CONCENTRATE PO Take 500 mg by mouth daily.    Yes [provider]  folic acid (FOLVITE) 400 MCG tablet Take 400 mcg by mouth at bedtime.   Yes [provider]  methotrexate (RHEUMATREX) 2.5 MG tablet Take 12.5 mg by mouth every Wednesday.    Yes [provider]  ciprofloxacin (CIPRO) 250 MG tablet Take 250 mg by mouth 2 (two) times daily.    [provider]  donepezil (ARICEPT) 10 MG tablet Take 10 mg by mouth at bedtime.    [provider]  loratadine (CLARITIN) 10 MG tablet Take 10 mg by mouth daily.    [provider]  memantine (NAMENDA) 10 MG tablet Take 10 mg by mouth 2 (two) times daily.    [provider]   No Known Allergies Review of Systems  Constitutional: Positive for activity change.  Musculoskeletal:       Leg pain preop, improved post op.     Physical Exam  Constitutional: No distress.  HENT:  Head: Normocephalic.  Pulmonary/Chest: Effort normal.  Neurological: She is alert.  Can answer some questions accurately   Skin: Skin is warm.    Vital Signs: BP 107/72   Pulse 70   Temp 98.8 F (37.1 C) (Oral)   Resp (!) 21   Ht 5\' 9"  (1.753 m)   Wt 59.9 kg (132 lb)   SpO2 98%   BMI 19.49 kg/m   Pain Assessment: PAINAD POSS *See Group Information*: 1-Acceptable,Awake and alert Pain Score: 2    SpO2: SpO2: 98 % O2  Device:SpO2: 98 % O2 Flow Rate: .O2 Flow Rate (L/min): 8 L/min  IO: Intake/output summary:  Intake/Output Summary (Last 24 hours) at 04/14/17 1426 Last data filed at 04/14/17 1400  Gross per 24 hour  Intake          3498.34 ml  Output              590 ml  Net          2908.34 ml    LBM: Last BM Date: 04/11/17 Baseline Weight: Weight: 54.4 kg (120 lb) Most recent weight: Weight: 59.9 kg (132 lb)     Palliative Assessment/Data: 30 %     Time In: 1:30 pm Time Out: 3:00pm Time Total: 1.5 hours Greater than 50%  of this time was spent counseling and coordinating care related to the above assessment and plan.  Signed by: Morton Stall, NP 04/14/2017 2:53 PM Office: [phone] 7am-7pm  Call primary team after hours  Please contact Palliative Medicine Team phone at [phone] for questions and concerns.  For individual provider: See Loretha Stapler

## 2017-04-14 NOTE — Progress Notes (Signed)
Pt's hemoglobin today is 6.8. Baseline was 8.1. Dr Tobi Bastos paged and ordered to transfuse one unit of blood. Pt is alert and oriented to self this time.

## 2017-04-14 NOTE — Progress Notes (Signed)
MEDICATION RELATED CONSULT NOTE  Pharmacy Consult for electrolyte management   Pharmacy consulted for electrolyte management for 83 yo female admitted to the ICU s/p right hip fracture repair now with acute on chronic blood loss.   Plan:   Patient ordered potassium PO BID x 2 doses. No further replacement warranted at this time. Will recheck electrolytes with am labs.   No Known Allergies  Patient Measurements: Height: 5\' 9"  (175.3 cm) Weight: 132 lb (59.9 kg) IBW/kg (Calculated) : 66.2  Vital Signs: Temp: 98.8 F (37.1 C) (10/03 1349) Temp Source: Oral (10/03 1349) BP: 107/72 (10/03 1349) Pulse Rate: 70 (10/03 1349) Intake/Output from previous day: 10/02 0701 - 10/03 0700 In: 2178.3 [P.O.:240; I.V.:1938.3] Out: 590 [Urine:590] Intake/Output from this shift: Total I/O In: 1320 [I.V.:1000; Blood:320] Out: -   Labs:  Recent Labs  04/11/17 1948 04/12/17 0316 04/13/17 0347 04/13/17 1606 04/14/17 0403 04/14/17 1026  WBC 6.7 9.5 7.5  --  7.2  --   HGB 12.5 11.0* 8.1* 7.2* 6.8* 7.7*  HCT 37.5 32.4* 23.9*  --  20.5* 23.0*  PLT 153 130* 87*  --  78*  --   CREATININE 1.46* 1.00 0.74  --  0.87  --   MG  --   --   --   --  1.8  --   ALBUMIN 3.5  --   --   --   --   --   PROT 6.6  --   --   --   --   --   AST 19  --   --   --   --   --   ALT 9*  --   --   --   --   --   ALKPHOS 49  --   --   --   --   --   BILITOT 0.6  --   --   --   --   --   BILIDIR 0.1  --   --   --   --   --   IBILI 0.5  --   --   --   --   --    Estimated Creatinine Clearance: 47.1 mL/min (by C-G formula based on SCr of 0.87 mg/dL).    Medical History: Past Medical History:  Diagnosis Date  . Arthritis   . Atrial fibrillation (HCC)   . Cerebral vascular accident (HCC)   . DVT (deep venous thrombosis) (HCC)   . Dysrhythmia   . GERD (gastroesophageal reflux disease)   . Interstitial lung disease (HCC)   . Osteoporosis     Pharmacy will continue to monitor and adjust per consult.    Simpson,Michael L 04/14/2017,2:58 PM

## 2017-04-14 NOTE — Consult Note (Signed)
[place]  [phone]) [phone] [phone]) [phone]  ID: Kelly Wright OB: August 19, 1932  MR#: [id number]  EXB#:[id number]  Patient Care Team: Marguerita Merles, MD as PCP - General (Family Medicine)  CHIEF COMPLAINT: Progressive anemia and thrombocytopenia.  INTERVAL HISTORY: Patient is an 83 year old female who is postoperative day 2 from ORIF of the right hip was noted to have a declining hemoglobin and platelet count. She was recently transferred to the ICU for hypotension. Patient has some mild confusion, but family is at bedside. She has no neurologic complaints. She denies any fevers. She does not complain of chest pain or shortness of breath. She denies any nausea, vomiting, constipation, or diarrhea. She has no urinary complaints. Patient offers no specific complaints today.  REVIEW OF SYSTEMS:   Review of Systems  Constitutional: Positive for malaise/fatigue. Negative for fever and weight loss.  Respiratory: Negative.  Negative for cough, hemoptysis and shortness of breath.   Cardiovascular: Negative.  Negative for chest pain and leg swelling.  Gastrointestinal: Negative.  Negative for abdominal pain.  Genitourinary: Negative.   Musculoskeletal: Positive for joint pain.  Skin: Negative.  Negative for rash.  Neurological: Positive for weakness.  Psychiatric/Behavioral: Positive for memory loss.    As per HPI. Otherwise, a complete review of systems is negative.  PAST MEDICAL HISTORY: Past Medical History:  Diagnosis Date  . Arthritis   . Atrial fibrillation ([place])   . Cerebral vascular accident ([place])   . DVT (deep venous thrombosis) ([place])   . Dysrhythmia   . GERD (gastroesophageal reflux disease)   . Interstitial lung disease ([place])   . Osteoporosis     PAST SURGICAL HISTORY: Past Surgical History:  Procedure Laterality Date  . ABDOMINAL HYSTERECTOMY    . BILATERAL CARPAL TUNNEL RELEASE    . EYE SURGERY    . INTRAMEDULLARY (IM) NAIL  INTERTROCHANTERIC Right 04/12/2017   Procedure: INTRAMEDULLARY (IM) NAIL INTERTROCHANTRIC;  Surgeon: Corky Mull, MD;  Location: [facility] ORS;  Service: Orthopedics;  Laterality: Right;    FAMILY HISTORY: Family History  Problem Relation Age of Onset  . Hodgkin's lymphoma Mother   . Cancer - Lung Father   . CVA Other     ADVANCED DIRECTIVES (Y/N):  @ADVDIR @  HEALTH MAINTENANCE: Social History  Substance Use Topics  . Smoking status: Never Smoker  . Smokeless tobacco: Never Used  . Alcohol use No     Colonoscopy:  PAP:  Bone density:  Lipid panel:  No Known Allergies  Current Facility-Administered Medications  Medication Dose Route Frequency Provider Last Rate Last Dose  . 0.9 %  sodium chloride infusion   Intravenous Continuous Max Sane, MD 100 mL/hr at 04/14/17 1400    . 0.9 %  sodium chloride infusion   Intravenous Once Pyreddy, [place], MD      . 0.9 %  sodium chloride infusion  250 mL Intravenous PRN Max Sane, MD      . acetaminophen (TYLENOL) tablet 650 mg  650 mg Oral Q6H PRN Poggi, Marshall Cork, MD       Or  . acetaminophen (TYLENOL) suppository 650 mg  650 mg Rectal Q6H PRN Poggi, Marshall Cork, MD      . bisacodyl (DULCOLAX) suppository 10 mg  10 mg Rectal Daily PRN Poggi, Marshall Cork, MD      . ceFAZolin (ANCEF) 2 g in dextrose 5 % 100 mL IVPB  2 g Intravenous 30 min Pre-Op Poggi, Marshall Cork, MD      . Chlorhexidine Gluconate  Cloth 2 % PADS 6 each  6 each Topical Q0600 Max Sane, MD   6 each at 04/14/17 0612  . citalopram (CELEXA) tablet 30 mg  30 mg Oral QHS Crosley, Debby, MD   30 mg at 04/13/17 2142  . diphenhydrAMINE (BENADRYL) 12.5 MG/5ML elixir 12.5-25 mg  12.5-25 mg Oral Q4H PRN Poggi, Marshall Cork, MD   12.5 mg at 04/13/17 2142  . docusate sodium (COLACE) capsule 100 mg  100 mg Oral BID Corky Mull, MD   100 mg at 04/14/17 4742  . donepezil (ARICEPT) tablet 10 mg  10 mg Oral QHS Crosley, Debby, MD   10 mg at 04/13/17 2142  . folic acid (FOLVITE) tablet 0.5 mg  500 mcg Oral QHS  Crosley, Debby, MD   0.5 mg at 04/13/17 2142  . loratadine (CLARITIN) tablet 10 mg  10 mg Oral Daily Quintella Baton, MD   10 mg at 04/14/17 0824  . magnesium hydroxide (MILK OF MAGNESIA) suspension 30 mL  30 mL Oral Daily PRN Poggi, Marshall Cork, MD      . memantine [facility]) tablet 10 mg  10 mg Oral BID Quintella Baton, MD   10 mg at 04/14/17 0824  . methotrexate (RHEUMATREX) tablet 12.5 mg  12.5 mg Oral Q Philipp Deputy, Debby, MD   12.5 mg at 04/14/17 0823  . metoCLOPramide (REGLAN) tablet 5-10 mg  5-10 mg Oral Q8H PRN Poggi, Marshall Cork, MD       Or  . metoCLOPramide (REGLAN) injection 5-10 mg  5-10 mg Intravenous Q8H PRN Poggi, Marshall Cork, MD      . mupirocin ointment (BACTROBAN) 2 % 1 application  1 application Nasal BID Max Sane, MD   1 application at 59/56/38 0824  . ondansetron (ZOFRAN) tablet 4 mg  4 mg Oral Q6H PRN Poggi, Marshall Cork, MD   4 mg at 04/13/17 1058   Or  . ondansetron (ZOFRAN) injection 4 mg  4 mg Intravenous Q6H PRN Poggi, Marshall Cork, MD      . pantoprazole (PROTONIX) EC tablet 40 mg  40 mg Oral BID Max Sane, MD      . Derrill Memo ON 04/15/2017] pneumococcal 23 valent vaccine (PNU-IMMUNE) injection 0.5 mL  0.5 mL Intramuscular Tomorrow-1000 Manuella Ghazi, Vipul, MD      . polyethylene glycol (MIRALAX / GLYCOLAX) packet 17 g  17 g Oral Daily PRN Crosley, Debby, MD      . sodium chloride flush (NS) 0.9 % injection 3 mL  3 mL Intravenous Q12H Manuella Ghazi, Vipul, MD   3 mL at 04/14/17 1000  . sodium chloride flush (NS) 0.9 % injection 3 mL  3 mL Intravenous PRN Max Sane, MD      . sodium phosphate (FLEET) 7-19 GM/118ML enema 1 enema  1 enema Rectal Once PRN Poggi, Marshall Cork, MD        OBJECTIVE: Vitals:   04/14/17 1016 04/14/17 1349  BP: (!) 92/51 107/72  Pulse: (!) 124 70  Resp:  (!) 21  Temp: 98.5 F (36.9 C) 98.8 F (37.1 C)  SpO2: 98%      Body mass index is 19.49 kg/m.    ECOG FS:3 - Symptomatic, >50% confined to bed  General: thin, no acute distress. Eyes: Pink conjunctiva, anicteric sclera. Lungs:  Clear to auscultation bilaterally. Heart: Regular rate and rhythm. No rubs, murmurs, or gallops. Abdomen: Soft, nontender, nondistended. No organomegaly noted, normoactive bowel sounds. Musculoskeletal: No edema, cyanosis, or clubbing. Neuro: Alert. Cranial nerves grossly intact. Skin: No  rashes or petechiae noted. Psych: Normal affect.   LAB RESULTS:  Lab Results  Component Value Date   NA 141 04/14/2017   K 3.4 (L) 04/14/2017   CL 113 (H) 04/14/2017   CO2 26 04/14/2017   GLUCOSE 97 04/14/2017   BUN 15 04/14/2017   CREATININE 0.87 04/14/2017   CALCIUM 7.5 (L) 04/14/2017   PROT 6.6 04/11/2017   ALBUMIN 3.5 04/11/2017   AST 19 04/11/2017   ALT 9 (L) 04/11/2017   ALKPHOS 49 04/11/2017   BILITOT 0.6 04/11/2017   GFRNONAA >60 04/14/2017   GFRAA >60 04/14/2017    Lab Results  Component Value Date   WBC 7.2 04/14/2017   NEUTROABS 4.9 04/11/2017   HGB 7.7 (L) 04/14/2017   HCT 23.0 (L) 04/14/2017   MCV 97.7 04/14/2017   PLT 78 (L) 04/14/2017     STUDIES: Ct Head Wo Contrast  Result Date: 04/11/2017 CLINICAL DATA:  Fall tonight with laceration above right orbit. EXAM: CT HEAD WITHOUT CONTRAST TECHNIQUE: Contiguous axial images were obtained from the base of the skull through the vertex without intravenous contrast. COMPARISON:  08/09/2016 FINDINGS: Brain: The ventricles and cisterns are within normal. There is minimal age related atrophic change. There is mild chronic ischemic microvascular disease. There is no mass, mass effect, shift of midline structures or acute hemorrhage. No evidence of acute infarction. Vascular: No hyperdense vessel or unexpected calcification. Skull: Normal. Negative for fracture or focal lesion. Sinuses/Orbits: No acute finding. Other: None. IMPRESSION: No acute intracranial findings. Mild chronic ischemic microvascular disease and age related atrophic change. Electronically Signed   By: Marin Olp M.D.   On: 04/11/2017 21:36   Dg Pelvis  Portable  Result Date: 04/11/2017 CLINICAL DATA:  Right hip pain post fall this evening. EXAM: PORTABLE PELVIS 1-2 VIEWS COMPARISON:  05/15/2011 FINDINGS: Diffuse osteopenia. Mild symmetric degenerative change of the hips. There is a moderately displaced intertrochanteric fracture of the right hip. There is a displaced right lesser trochanter. Mild degenerate change of the symphysis pubis joint. Degenerative change of the spine. IMPRESSION: Displaced right intertrochanteric hip fracture. Electronically Signed   By: Marin Olp M.D.   On: 04/11/2017 20:54   Dg Chest Port 1 View  Result Date: 04/11/2017 CLINICAL DATA:  Preop. EXAM: PORTABLE CHEST 1 VIEW COMPARISON:  02/20/2016 FINDINGS: Lungs are adequately inflated with subtle opacification in the left base likely atelectasis. No evidence of effusion. Remainder of the lungs are clear. Cardiomediastinal silhouette is within normal. Multiple old left rib fractures. Remainder of the exam is unchanged. IMPRESSION: Subtle opacification in the left base likely atelectasis. Electronically Signed   By: Marin Olp M.D.   On: 04/11/2017 20:55   Dg Hip Operative Unilat W Or W/o Pelvis Right  Result Date: 04/12/2017 CLINICAL DATA:  Intraoperative fluoro spot series EXAM: OPERATIVE right HIP (WITH PELVIS IF PERFORMED) 6 VIEWS. Fluoro time reported is 58 seconds. TECHNIQUE: Fluoroscopic spot image(s) were submitted for interpretation post-operatively. COMPARISON:  AP pelvis of April 11, 2017 FINDINGS: The patient has undergone inter medullary rod and telescoping screw placement for an intertrochanteric fracture of the right hip. Alignment is near anatomic. Avulsion of the lesser trochanter is present. The interface of the into medullary rod with the native bone is good. IMPRESSION: Status post ORIF for intertrochanteric fracture of the left hip. Electronically Signed   By: David  [place] M.D.   On: 04/12/2017 15:56    ASSESSMENT: Progressive anemia and  thrombocytopenia.  PLAN:    1. Thrombocytopenia: HITT  is a possibility, but the declined her platelets is more rapid than expected. It is appropriate to discontinue heparin and Lovenox until her HITT antibodies have resulted. The rapid decline, may be consumptive the nature from recent right hip surgery and bleeding. Continue to monitor daily CBC. 2. Progressive anemia: Possibly secondary to acute blood loss. Will get iron stores and hemolysis labs for completeness. Patient does not require bone marrow biopsy at this time. 3. ORIF right hip: Continue monitoring and treatment per orthopedics.  Appreciate consult, will follow.   Lloyd Huger, MD   04/14/2017 3:06 PM

## 2017-04-14 NOTE — Progress Notes (Signed)
Patient was transferred to ICU. Patient family was notified and is a ware of patient moving. Report was called and given to [facility].

## 2017-04-14 NOTE — Progress Notes (Signed)
PATIENT NAME: Kelly Wright    MR#:  [id number]  DATE OF BIRTH:  Dec 29, 1932 REFERRING PHYSICIAN SHAH  SUBJECTIVE:  CHIEF COMPLAINT:   Chief Complaint  Patient presents with  . Fall  Hb 6.8, now Hypotensive/tachycardic, not feeling well, seems somewhat confused   HPI 83 year old pleasant white female initially admitted for status post fall she suffered a right hip fracture and it up having surgery and postop complicated by hypotension acute blood loss anemia  Patient currently is alert and awake however pleasantly confused Patient is not in any distress no chest pain no shortness of breath no work of breathing No signs of fever Family at bedside updated and notified REVIEW OF SYSTEMS:  Review of Systems  Constitutional: Positive for malaise/fatigue. Negative for chills, fever and weight loss.  HENT: Negative for nosebleeds and sore throat.   Eyes: Negative for blurred vision.  Respiratory: Negative for cough, shortness of breath and wheezing.   Cardiovascular: Negative for chest pain, orthopnea, leg swelling and PND.  Gastrointestinal: Negative for abdominal pain, constipation, diarrhea, heartburn, nausea and vomiting.  Genitourinary: Negative for dysuria and urgency.  Musculoskeletal: Positive for joint pain. Negative for back pain.  Skin: Negative for rash.  Neurological: Positive for weakness. Negative for dizziness, speech change, focal weakness and headaches.  Endo/Heme/Allergies: Does not bruise/bleed easily.  Psychiatric/Behavioral: Negative for depression.   DRUG ALLERGIES:  No Known Allergies VITALS:  Blood pressure 107/72, pulse 70, temperature 98.8 F (37.1 C), temperature source Oral, resp. rate (!) 21, height 5\' 9"  (1.753 m), weight 132 lb (59.9 kg), SpO2 98 %. PHYSICAL EXAMINATION:  Physical Exam  Constitutional: She is well-developed, well-nourished, and in no distress.  HENT:  Head: Normocephalic and atraumatic.  Eyes: Pupils are equal, round, and  reactive to light. Conjunctivae and EOM are normal.  Neck: Normal range of motion. Neck supple. No tracheal deviation present. No thyromegaly present.  Cardiovascular: Normal rate, regular rhythm and normal heart sounds.   Pulmonary/Chest: Effort normal and breath sounds normal. No respiratory distress. She has no wheezes. She exhibits no tenderness.  Abdominal: Soft. Bowel sounds are normal. She exhibits no distension. There is no tenderness.  Musculoskeletal:       Right hip: She exhibits normal range of motion, normal strength, no tenderness and no bony tenderness.  Surgical site dressing looks clean  Neurological: She is alert. No cranial nerve deficit.  confused  Skin: Skin is warm and dry. No rash noted.  Psychiatric: Mood and affect normal.   LABORATORY PANEL:  Female CBC  Recent Labs Lab 04/14/17 0403 04/14/17 1026  WBC 7.2  --   HGB 6.8* 7.7*  HCT 20.5* 23.0*  PLT 78*  --    ------------------------------------------------------------------------------------------------------------------ Chemistries   Recent Labs Lab 04/11/17 1948  04/14/17 0403  NA 139  < > 141  K 3.8  < > 3.4*  CL 105  < > 113*  CO2 28  < > 26  GLUCOSE 149*  < > 97  BUN 19  < > 15  CREATININE 1.46*  < > 0.87  CALCIUM 8.9  < > 7.5*  MG  --   --  1.8  AST 19  --   --   ALT 9*  --   --   ALKPHOS 49  --   --   BILITOT 0.6  --   --   < > = values in this interval not displayed. RADIOLOGY:  No results found. ASSESSMENT AND PLAN:  83 y.o. female  with a history of chronic atrial fibrillation, a history of DVT, gastroesophageal reflux disease, and a CVA who lives independently. Apparently she lost her balance descending several stairs, causing her to land on her right hip. She was brought to the emergency room where x-rays demonstrated a displaced  fracture of the intertrochanteric region of her right hip  Patient had right hip surgery and surgical repair now was admitted to the intensive care unit  and stepdown status unit for acute blood loss anemia with confusion with some low blood pressure along with a hemoglobin of 6.8  *Anemia/pancytopenia- acute on chronic blood loss -Hold her Lovenox as platelet counts dropped from 130->87->78 - HIT? Stop Lovenox - Ibuprofen,  -Some blood loss while at surgery -Hemoglobin dropped from 11.0->8.1->6.8 - transfuse one PRBC - check H & H Q 8 hrs ? Gastric ulcer will start GI prophylaxis -Follow up with the recommendations - check HIT panel - has h/o DVT, A.fib and CVA - high risk for blood clots  *Hypotension/Tachycardia -Can be due to blood loss -Pain meds as needed -continue IV fluids Baseline blood pressure is 90/60 as per family and patient Will not start vasopressors at this time   *Right hip pain -Reduction and internal fixation of displaced intertrochanteric right hip fracture on 10/1 -Physical therapy as tolerated - pain control  * Hypokalemia - replete and recheck  * Acute kidney injury -IV fluid hydration -Resolved with hydration  . History of chronic atrial fibrillation (HCC) -Rate controlled -EKG on this admission shows normal sinus rhythm - not a good candidate for anticoagulation  . Rheumatoid arthritis (HCC) -Stable  . Moderate Dementia -Stable  Overall poor prognosis (had long d/w daughter [name] over phone) - c/s palliative care   CODE STATUS: Full Code    Patient/Family are satisfied with Plan of action and management. All questions answered  Lucie Leather, M.D.  [facility]  Medical Director [facility] Medical Director [facility] Cardio-Pulmonary Department

## 2017-04-14 NOTE — Progress Notes (Signed)
PT Cancellation Note  Patient Details Name: Kelly Wright MRN: [id number] DOB: 1933/05/06   Cancelled Treatment:    Reason Eval/Treat Not Completed: Other (comment);Patient not medically ready. Treatment attempted twice this morn; initially pt with MD. Second attempt, pt being transferred to ICU 2 bed. Pt will need a new order/re evaluation for PT services.    Scot Dock, PTA 04/14/2017, 11:29 AM

## 2017-04-14 NOTE — Consult Note (Signed)
Cephas Darby, MD [address]  [place],  [address]  Main: [phone]  Fax: [phone] Pager: [phone]   Consultation  Referring Provider:     No ref. provider found Primary Care Physician:  Marguerita Merles, MD Primary Gastroenterologist: None         Reason for Consultation:     Acute anemia  Date of Admission:  04/11/2017 Date of Consultation:  04/14/2017         HPI:   Kelly Wright is a 83 y.o. female moderate dementia, other past medical history as detailed below admitted on 09/13/2018after a fall at homeresulting in right intertrochanteric fracture, underwent ORIF on 04/12/2017. She is found to have new-onset of anemia and worsening thrombocytopenia. Her baseline hemoglobin is between 12 and 13. On admission her hemoglobin was 12.5 followed by 11 on 04/12/2017 prior to surgery, dropped to 8.1 in 24 hours after surgery. Her platelets at baseline are mildly low between 140 and 150. On admission her lipase is 153, dropped to 130, followed by 87 after surgery. Hematology and GI consulted for evaluation of anemia and thrombocytopenia. Patient is transferred from floor to ICU due to the low blood pressures. Per her nurse, patient received fluid bolus and her blood pressure improved, currently waiting to be transferred to the floor. There was no witnessed rectal bleeding or melena or hematemesis in the ICU. Patient complaining of lower abdominal discomfort and feels like having a BM. She denies having abdominal pain, nausea, vomiting.  Her daughter who is a Marine scientist provided history as patient is a poor historian given her history of dementia. Her daughter reported that she had an upper endoscopy in 2011 secondary to severe anemia, underwent EGD at the time and she was told that she had gastric ulcer. She had repeat EGD in same year and gastric polyps were removed. There was no evidence of Helicobacter pylori infection.Per her daughter, she is only on aspirin 81 at  home. Previously was on anticoagulant few years ago and that was stopped due to concern for bleeding. She is not on any NSAIDs as outpatient. She has no known chronic liver disease/cirrhosis. She did not have any GI surgeries in the past. She had a colonoscopy about 5 or 6 years ago and was reportedly normal.  GI Procedures: EGD 03/05/10 Diagnosis:  ANTRUM COLD BIOPSIES:  MILD CHRONIC, NON-SPECIFIC GASTRITIS. NO HELICOBACTER ORGANISMS ARE  IDENTIFIED ON DIFF-QUIK STAIN.   EGD 06/02/2010 Diagnosis:  GASTRIC POLYP COLD BIOPSIES:  - FUNDIC GLAND POLYP.  - NEGATIVE FOR H.PYLORI, DYSPLASIA AND MALIGNANCY.   Past Medical History:  Diagnosis Date  . Arthritis   . Atrial fibrillation ([place])   . Cerebral vascular accident ([place])   . DVT (deep venous thrombosis) ([place])   . Dysrhythmia   . GERD (gastroesophageal reflux disease)   . Interstitial lung disease ([place])   . Osteoporosis     Past Surgical History:  Procedure Laterality Date  . ABDOMINAL HYSTERECTOMY    . BILATERAL CARPAL TUNNEL RELEASE    . EYE SURGERY    . INTRAMEDULLARY (IM) NAIL INTERTROCHANTERIC Right 04/12/2017   Procedure: INTRAMEDULLARY (IM) NAIL INTERTROCHANTRIC;  Surgeon: Corky Mull, MD;  Location: [facility] ORS;  Service: Orthopedics;  Laterality: Right;    Prior to Admission medications   Medication Sig Start Date End Date Taking? Authorizing Provider  aspirin EC 81 MG tablet Take 1 tablet (81 mg total) by mouth daily. 02/22/16  Yes Henreitta Leber, MD  citalopram (CELEXA) 20 MG tablet Take 30 mg by mouth at bedtime.    Yes [provider]  CRANBERRY CONCENTRATE PO Take 500 mg by mouth daily.    Yes [provider]  folic acid (FOLVITE) 465 MCG tablet Take 400 mcg by mouth at bedtime.   Yes [provider]  methotrexate (RHEUMATREX) 2.5 MG tablet Take 12.5 mg by mouth every Wednesday.    Yes [provider]  ciprofloxacin (CIPRO) 250 MG tablet Take 250 mg by mouth 2 (two) times daily.     [provider]  donepezil (ARICEPT) 10 MG tablet Take 10 mg by mouth at bedtime.    [provider]  loratadine (CLARITIN) 10 MG tablet Take 10 mg by mouth daily.    [provider]  memantine (NAMENDA) 10 MG tablet Take 10 mg by mouth 2 (two) times daily.    [provider]    Family History  Problem Relation Age of Onset  . Hodgkin's lymphoma Mother   . Cancer - Lung Father   . CVA Other      Social History  Substance Use Topics  . Smoking status: Never Smoker  . Smokeless tobacco: Never Used  . Alcohol use No    Allergies as of 04/11/2017  . (No Known Allergies)    Review of Systems:    All systems reviewed and negative except where noted in HPI.   Physical Exam:  Vital signs in last 24 hours: Temp:  [98.1 F (36.7 C)-98.8 F (37.1 C)] 98.8 F (37.1 C) (10/03 1349) Pulse Rate:  [70-124] 70 (10/03 1349) Resp:  [18-21] 21 (10/03 1349) BP: (92-119)/(39-72) 107/72 (10/03 1349) SpO2:  [95 %-99 %] 98 % (10/03 1016) Last BM Date: 04/11/17 General:   Pleasant, cooperative, NAD, pale appearing Head:  Normocephalic and atraumatic. Eyes:   No icterus.   Conjunctiva pale. PERRLA. Ears:  Normal auditory acuity. Neck:  Supple; no masses or thyroidomegaly Lungs: Respirations even and unlabored. Lungs clear to auscultation bilaterally.   No wheezes, crackles, or rhonchi.  Heart:  Regular rate and rhythm;  Without murmur, clicks, rubs or gallops Abdomen:  Soft, nondistended, nontender. Normal bowel sounds. No appreciable masses or hepatomegaly.  No rebound or guarding.  Rectal:  Solid balls of brown stool filled in the rectum Msk:  Staples from recent surgery in right hip. Fullness of right pelvis, ecchymosis and swelling of the right hip. Left hip appeared normal, right hip larger than left hip Extremities:  Without edema, cyanosis or clubbing. Neurologic:  Alert and oriented x2;  Skin:  Intact without significant lesions or rashes. Psych:   Alert and cooperative. Normal affect.  LAB RESULTS: CBC Latest Ref Rng & Units 04/14/2017 04/14/2017 04/13/2017  WBC 3.6 - 11.0 K/uL - 7.2 -  Hemoglobin 12.0 - 16.0 g/dL 7.7(L) 6.8(L) 7.2(L)  Hematocrit 35.0 - 47.0 % 23.0(L) 20.5(L) -  Platelets 150 - 440 K/uL - 78(L) -    BMET BMP Latest Ref Rng & Units 04/14/2017 04/13/2017 04/12/2017  Glucose 65 - 99 mg/dL 97 177(H) 169(H)  BUN 6 - 20 mg/dL 15 13 19   Creatinine 0.44 - 1.00 mg/dL 0.87 0.74 1.00  Sodium 135 - 145 mmol/L 141 136 139  Potassium 3.5 - 5.1 mmol/L 3.4(L) 4.0 4.5  Chloride 101 - 111 mmol/L 113(H) 107 107  CO2 22 - 32 mmol/L 26 25 27   Calcium 8.9 - 10.3 mg/dL 7.5(L) 7.9(L) 7.9(L)    LFT Hepatic Function Latest Ref Rng & Units  04/11/2017 02/20/2016 11/11/2014  Total Protein 6.5 - 8.1 g/dL 6.6 6.7 6.8  Albumin 3.5 - 5.0 g/dL 3.5 4.0 4.0  AST 15 - 41 U/L 19 21 28   ALT 14 - 54 U/L 9(L) 12(L) 16  Alk Phosphatase 38 - 126 U/L 49 47 47  Total Bilirubin 0.3 - 1.2 mg/dL 0.6 0.8 1.3(H)  Bilirubin, Direct 0.1 - 0.5 mg/dL 0.1 - -     STUDIES: No results found.    Impression / Plan:   Kelly Wright is a 83 y.o. female with moderate dementia, A. Fib, history of DVT, not on anticoagulation, presented with right intertrochanteric fracture status post ORIF,now with acute anemia and thrombocytopenia. Her MCV is normal. She does not have active GI bleeding or recent GI bleed. Her acute anemia and thrombocytopenia are probably secondary to blood loss from recent hip fracture resulting in underlying hematoma and there by consumption of RBCs and platelets. Agree with hematology to check serum ferritin, J03 and folic acid levels, haptoglobin, LDH, indirect bilirubin. I do not recommend endoscopy evaluation at this time as patient is not actively bleeding. If workup suggests that she has iron deficiency anemia, we can follow-up as outpatient and discuss the need for endoscopy evaluation at that time.   Constipation: start MiraLAX 17 g 2 times  daily Tap water enema or Fleet enema  Thank you for involving me in the care of this patient.      LOS: 3 days   Sherri Sear, MD  04/14/2017, 6:40 PM   Note: This dictation was prepared with Dragon dictation along with smaller phrase technology. Any transcriptional errors that result from this process are unintentional.

## 2017-04-15 LAB — TYPE AND SCREEN
ABO/RH(D): O POS
ANTIBODY SCREEN: NEGATIVE
UNIT DIVISION: 0

## 2017-04-15 LAB — BASIC METABOLIC PANEL
ANION GAP: 5 (ref 5–15)
BUN: 12 mg/dL (ref 6–20)
CO2: 24 mmol/L (ref 22–32)
Calcium: 7.5 mg/dL — ABNORMAL LOW (ref 8.9–10.3)
Chloride: 111 mmol/L (ref 101–111)
Creatinine, Ser: 0.75 mg/dL (ref 0.44–1.00)
GFR calc Af Amer: >60 mL/min (ref >60)
GLUCOSE: 125 mg/dL — AB (ref 65–99)
POTASSIUM: 3.5 mmol/L (ref 3.5–5.1)
Sodium: 140 mmol/L (ref 135–145)

## 2017-04-15 LAB — CBC
HEMATOCRIT: 25.3 % — AB (ref 35.0–47.0)
HEMOGLOBIN: 8.6 g/dL — AB (ref 12.0–16.0)
MCH: 31.6 pg (ref 26.0–34.0)
MCHC: 34 g/dL (ref 32.0–36.0)
MCV: 92.7 fL (ref 80.0–100.0)
Platelets: 98 10*3/uL — ABNORMAL LOW (ref 150–440)
RBC: 2.72 MIL/uL — ABNORMAL LOW (ref 3.80–5.20)
RDW: 18 % — ABNORMAL HIGH (ref 11.5–14.5)
WBC: 7 10*3/uL (ref 3.6–11.0)

## 2017-04-15 LAB — BPAM RBC
BLOOD PRODUCT EXPIRATION DATE: 201810052359
ISSUE DATE / TIME: 201810030946
Unit Type and Rh: 5100

## 2017-04-15 LAB — HEMOGLOBIN AND HEMATOCRIT, BLOOD
HCT: 27.3 % — ABNORMAL LOW (ref 35.0–47.0)
HEMATOCRIT: 26.7 % — AB (ref 35.0–47.0)
HEMOGLOBIN: 9.5 g/dL — AB (ref 12.0–16.0)
Hemoglobin: 9 g/dL — ABNORMAL LOW (ref 12.0–16.0)

## 2017-04-15 LAB — HEPATIC FUNCTION PANEL
ALT: 7 U/L — ABNORMAL LOW (ref 14–54)
AST: 23 U/L (ref 15–41)
Albumin: 2.6 g/dL — ABNORMAL LOW (ref 3.5–5.0)
Alkaline Phosphatase: 40 U/L (ref 38–126)
Bilirubin, Direct: 0.2 mg/dL (ref 0.1–0.5)
Indirect Bilirubin: 0.8 mg/dL (ref 0.3–0.9)
TOTAL PROTEIN: 5.1 g/dL — AB (ref 6.5–8.1)
Total Bilirubin: 1 mg/dL (ref 0.3–1.2)

## 2017-04-15 LAB — VITAMIN B12: VITAMIN B 12: 1690 pg/mL — AB (ref 180–914)

## 2017-04-15 LAB — CALCIUM: CALCIUM: 8 mg/dL — AB (ref 8.9–10.3)

## 2017-04-15 LAB — MAGNESIUM: Magnesium: 1.8 mg/dL (ref 1.7–2.4)

## 2017-04-15 LAB — HEPARIN INDUCED PLATELET AB (HIT ANTIBODY): HEPARIN INDUCED PLT AB: 0.184 {OD_unit} (ref 0.000–0.400)

## 2017-04-15 LAB — GLUCOSE, CAPILLARY: GLUCOSE-CAPILLARY: 139 mg/dL — AB (ref 65–99)

## 2017-04-15 MED ORDER — METOPROLOL TARTRATE 5 MG/5ML IV SOLN
2.5000 mg | INTRAVENOUS | Status: DC | PRN
  Administered 2017-04-16: 5 mg via INTRAVENOUS
  Filled 2017-04-15: qty 5

## 2017-04-15 MED ORDER — AMIODARONE HCL 200 MG PO TABS
400.0000 mg | ORAL_TABLET | Freq: Every day | ORAL | Status: DC
  Administered 2017-04-15: 400 mg via ORAL
  Filled 2017-04-15: qty 2

## 2017-04-15 MED ORDER — DILTIAZEM HCL 25 MG/5ML IV SOLN
5.0000 mg | Freq: Once | INTRAVENOUS | Status: DC
  Filled 2017-04-15: qty 5

## 2017-04-15 MED ORDER — METOPROLOL TARTRATE 5 MG/5ML IV SOLN
INTRAVENOUS | Status: AC
  Filled 2017-04-15: qty 5

## 2017-04-15 NOTE — Progress Notes (Signed)
Spoke with Dr. Nicholos Johns in person about patient's run of SVT. Patient asymptomatic. MD acknowledged and reviewed patient's condition. MD ordered prn metoprolol IV and is awaiting cardiology input. Team will continue to monitor.

## 2017-04-15 NOTE — Progress Notes (Signed)
1        [facility] [place] [facility]   PATIENT NAME: Kelly Wright    MR#:  [id number]  DATE OF BIRTH:  1933/05/28  SUBJECTIVE:  CHIEF COMPLAINT:   Chief Complaint  Patient presents with  . Fall  Hb 9, Had rapid response called earlier for HR 170-200s, patient is awake REVIEW OF SYSTEMS:  Review of Systems  Constitutional: Positive for malaise/fatigue. Negative for chills, fever and weight loss.  HENT: Negative for nosebleeds and sore throat.   Eyes: Negative for blurred vision.  Respiratory: Negative for cough, shortness of breath and wheezing.   Cardiovascular: Negative for chest pain, orthopnea, leg swelling and PND.  Gastrointestinal: Negative for abdominal pain, constipation, diarrhea, heartburn, nausea and vomiting.  Genitourinary: Negative for dysuria and urgency.  Musculoskeletal: Positive for joint pain. Negative for back pain.  Skin: Negative for rash.  Neurological: Positive for weakness. Negative for dizziness, speech change, focal weakness and headaches.  Endo/Heme/Allergies: Does not bruise/bleed easily.  Psychiatric/Behavioral: Negative for depression.   DRUG ALLERGIES:  No Known Allergies VITALS:  Blood pressure 119/77, pulse 81, temperature 98.4 F (36.9 C), temperature source Oral, resp. rate (!) 21, height 5\' 9"  (1.753 m), weight 59.9 kg (132 lb), SpO2 100 %. PHYSICAL EXAMINATION:  Physical Exam  Constitutional: She is well-developed, well-nourished, and in no distress.  HENT:  Head: Normocephalic and atraumatic.  Eyes: Pupils are equal, round, and reactive to light. Conjunctivae and EOM are normal.  Neck: Normal range of motion. Neck supple. No tracheal deviation present. No thyromegaly present.  Cardiovascular: Normal heart sounds.  An irregularly irregular rhythm present. Tachycardia present.   Pulmonary/Chest: Effort normal and breath sounds normal. No respiratory distress. She has no wheezes. She exhibits no tenderness.    Abdominal: Soft. Bowel sounds are normal. She exhibits no distension. There is no tenderness.  Musculoskeletal:       Right hip: She exhibits normal range of motion, normal strength, no tenderness and no bony tenderness.  Surgical site dressing looks clean  Neurological: She is alert. No cranial nerve deficit.  confused  Skin: Skin is warm and dry. No rash noted.  Psychiatric: Mood and affect normal.   LABORATORY PANEL:  Female CBC  Recent Labs Lab 04/15/17 0155 04/15/17 0956  WBC 7.0  --   HGB 8.6* 9.0*  HCT 25.3* 26.7*  PLT 98*  --    ------------------------------------------------------------------------------------------------------------------ Chemistries   Recent Labs Lab 04/11/17 1948  04/14/17 0403 04/15/17 0155  NA 139  < > 141 140  K 3.8  < > 3.4* 3.5  CL 105  < > 113* 111  CO2 28  < > 26 24  GLUCOSE 149*  < > 97 125*  BUN 19  < > 15 12  CREATININE 1.46*  < > 0.87 0.75  CALCIUM 8.9  < > 7.5* 7.5*  MG  --   --  1.8  --   AST 19  --   --   --   ALT 9*  --   --   --   ALKPHOS 49  --   --   --   BILITOT 0.6  --   --   --   < > = values in this interval not displayed. RADIOLOGY:  No results found. ASSESSMENT AND PLAN:  83 y.o. female with a history of chronic atrial fibrillation, a history of DVT, gastroesophageal reflux disease, and a CVA who lives independently. Apparently she lost her balance  descending several stairs, causing her to land on her right hip. She was brought to the emergency room where x-rays demonstrated a displaced 4 part fracture of the intertrochanteric region of her right hip  * Hypotension/Tachycardia - HR up to 170-200s at rest - Cardizem if need - Cardio c/s  *Anemia/pancytopenia- acute on chronic blood loss -Hold her Lovenox as platelet counts dropped from 130->87->78 - HIT? Stop Lovenox - Ibuprofen  -Some blood loss while at surgery - Hemoglobin dropped from 11.0->8.1->6.8->9.0 - s/p one PRBC - check H & H Q 8 hrs - Daughter  reported h/o stomach ulcers - stopped ibuprofen  - c/s Onco (r/o HIT? Underlying hematologic malignancy), and GI - pending HIT panel - has h/o DVT, A.fib and CVA - high risk for blood clots  *Right hip pain -Reduction and internal fixation of displaced intertrochanteric right hip fracture on 10/1 -Physical therapy as tolerated - pain control  * Hypokalemia - repleted and resolved  * Acute kidney injury -IV fluid hydration -Resolved with hydration  . History of chronic atrial fibrillation (HCC) -Rate 170-200s -EKG on this admission shows normal sinus rhythm - not a good candidate for anticoagulation  . Rheumatoid arthritis (HCC) -Stable  . Moderate Dementia -Stable   Had rapid response this am - will transfer to to ICU/Step-down  She looks critically sick and High risk for cardio-resp arrest and death.  Overall poor prognosis (had long d/w daughter [name] over phone) - c/s palliative care   Physical therapy recommends short-term rehab/skilled nursing facility   All the records are reviewed and case discussed with Care Management/Social Worker. Management plans discussed with the patient, nursing (with daughter [name] over phone), Dr Belia Heman and they are in agreement.  CODE STATUS: Full Code  TOTAL TIME (CRITICAL CARE) TAKING CARE OF THIS PATIENT: 35 minutes.   More than 50% of the time was spent in counseling/coordination of care: YES  POSSIBLE D/C IN 3-4 DAYS, DEPENDING ON CLINICAL CONDITION    Delfino Lovett M.D on 04/15/2017 at 12:22 PM  Between 7am to 6pm - Pager - [phone]  After 6pm go to [email or web address] - Social research officer, government  [facility] [place] [facility]  Office  [phone]  CC: Primary care physician; Leanna Sato, MD  Note: This dictation was prepared with Dragon dictation along with smaller phrase technology. Any transcriptional errors that result from this process are unintentional.

## 2017-04-15 NOTE — Progress Notes (Signed)
Clinical Child psychotherapist (CSW) updated Hydrologist at [facility] on patient's status. Plan is for patient to D/C to [facility] when medically stable.   [employer], LCSW [phone]

## 2017-04-15 NOTE — Progress Notes (Signed)
MEDICATION RELATED CONSULT NOTE  Pharmacy Consult for electrolyte management   Pharmacy consulted for electrolyte management for 83 yo female admitted to the ICU s/p right hip fracture repair now with acute on chronic blood loss. Patient has atrial fibrillation.  Plan:  K 3.5 is WNL. No supplementation needed at this time. Will recheck electrolytes with AM labs tomorrow and continue to follow.  No Known Allergies  Patient Measurements: Height: 5\' 9"  (175.3 cm) Weight: 132 lb (59.9 kg) IBW/kg (Calculated) : 66.2  Vital Signs: Temp: 98.4 F (36.9 C) (10/04 1122) Temp Source: Oral (10/04 1122) BP: 119/77 (10/04 1122) Pulse Rate: 81 (10/04 1122) Intake/Output from previous day: 10/03 0701 - 10/04 0700 In: 1720 [I.V.:1400; Blood:320] Out: -  Intake/Output from this shift: No intake/output data recorded.  Labs:  Recent Labs  04/13/17 0347  04/14/17 0403  04/14/17 1951 04/15/17 0155 04/15/17 0956  WBC 7.5  --  7.2  --  7.1 7.0  --   HGB 8.1*  < > 6.8*  < > 8.8* 8.6* 9.0*  HCT 23.9*  --  20.5*  < > 26.1* 25.3* 26.7*  PLT 87*  --  78*  --  91* 98*  --   CREATININE 0.74  --  0.87  --   --  0.75  --   MG  --   --  1.8  --   --   --   --   < > = values in this interval not displayed. Estimated Creatinine Clearance: 51.3 mL/min (by C-G formula based on SCr of 0.75 mg/dL).  Medical History: Past Medical History:  Diagnosis Date  . Arthritis   . Atrial fibrillation (HCC)   . Cerebral vascular accident ([facility])   . DVT (deep venous thrombosis) ([facility])   . Dysrhythmia   . GERD (gastroesophageal reflux disease)   . Interstitial lung disease (HCC)   . Osteoporosis     Pharmacy will continue to monitor and adjust per consult.   06/15/17, PharmD Clinical Pharmacist 04/15/2017,11:40 AM

## 2017-04-15 NOTE — Progress Notes (Signed)
Notified Dr. Belia Heman in person of patient's arrival on unit post rapid response and transfer from 1A for rapid heart rate (heart rate resolved back to 80s by the time patient arrive in ICU with no medication administration). MD ordered a cardiology consult (daughter reports that patient see Dr. Welton Flakes) and to not give the one time order of 5 mg of IV cardizem. Team will continue to monitor.

## 2017-04-15 NOTE — Progress Notes (Signed)
Bedside report given to Sarah, RN.

## 2017-04-15 NOTE — Progress Notes (Signed)
Subjective: 3 Days Post-Op Procedure(s) (LRB): INTRAMEDULLARY (IM) NAIL INTERTROCHANTRIC (Right) Patient reports pain as mild.   Patient has been transferred to ICU due to rapid HR. Plan will be for discharge to SNF when medically appropriate. Negative for chest pain and shortness of breath Fever: no Gastrointestinal:Negative for nausea and vomiting  Objective: Vital signs in last 24 hours: Temp:  [97.8 F (36.6 C)-98.8 F (37.1 C)] 98.4 F (36.9 C) (10/04 1122) Pulse Rate:  [60-197] 81 (10/04 1122) Resp:  [15-24] 21 (10/04 1122) BP: (106-159)/(56-96) 119/77 (10/04 1122) SpO2:  [96 %-100 %] 100 % (10/04 1122)  Intake/Output from previous day:  Intake/Output Summary (Last 24 hours) at 04/15/17 1314 Last data filed at 04/14/17 1800  Gross per 24 hour  Intake             1720 ml  Output                0 ml  Net             1720 ml    Intake/Output this shift: No intake/output data recorded.  Labs:  Recent Labs  04/14/17 0403 04/14/17 1026 04/14/17 1951 04/15/17 0155 04/15/17 0956  HGB 6.8* 7.7* 8.8* 8.6* 9.0*    Recent Labs  04/14/17 1951 04/15/17 0155 04/15/17 0956  WBC 7.1 7.0  --   RBC 2.75* 2.72*  --   HCT 26.1* 25.3* 26.7*  PLT 91* 98*  --     Recent Labs  04/14/17 0403 04/15/17 0155  NA 141 140  K 3.4* 3.5  CL 113* 111  CO2 26 24  BUN 15 12  CREATININE 0.87 0.75  GLUCOSE 97 125*  CALCIUM 7.5* 7.5*   No results for input(s): LABPT, INR in the last 72 hours.   EXAM General - Patient is Alert, Appropriate, Lacking and pale this AM Extremity - ABD soft Sensation intact distally Intact pulses distally Dorsiflexion/Plantar flexion intact Incision: dressing C/D/I No cellulitis present Dressing/Incision - clean, dry, no drainage Motor Function - intact, moving foot and toes well on exam.   Abdomen soft on exam. Normal BS.  Past Medical History:  Diagnosis Date  . Arthritis   . Atrial fibrillation (HCC)   . Cerebral vascular accident  (HCC)   . DVT (deep venous thrombosis) (HCC)   . Dysrhythmia   . GERD (gastroesophageal reflux disease)   . Interstitial lung disease (HCC)   . Osteoporosis     Assessment/Plan: 3 Days Post-Op Procedure(s) (LRB): INTRAMEDULLARY (IM) NAIL INTERTROCHANTRIC (Right) Active Problems:   Rheumatoid arthritis (HCC)   Atrial fibrillation (HCC)   Dementia   Closed displaced intertrochanteric fracture of right femur (HCC)  Estimated body mass index is 19.49 kg/m as calculated from the following:   Height as of this encounter: 5\' 9"  (1.753 m).   Weight as of this encounter: 59.9 kg (132 lb). Advance diet Up with therapy   Labs reviewed, Hg 9.0, HCT 26.7.  BP 119/62 and HR 81. Monitor HR per internal medicine/ ICU team. Begin working on having a BM. Hypokalemia resolved.  DVT Prophylaxis - Lovenox, Foot Pumps and TED hose Weight-Bearing as tolerated to right leg  J. , PA-C [facility] 04/15/2017, 1:14 PM

## 2017-04-15 NOTE — Progress Notes (Signed)
PT Cancellation Note  Patient Details Name: Kelly Wright MRN: [id number] DOB: 1932-12-23   Cancelled Treatment:    Reason Eval/Treat Not Completed: Medical issues which prohibited therapy Pt had episode this AM of heart rate increasing to nearly 200, transferred to the CCU.  PM PT session deferred, orders will be completed and pt will need new orders for PT when medically appropriate.   Malachi Pro, DPT 04/15/2017, 3:31 PM

## 2017-04-15 NOTE — Progress Notes (Signed)
Pts HR 199, Rapid Response called, MD Sherryll Burger at the bedside. Orders received to transfer pt to bed 6 ICU.

## 2017-04-15 NOTE — Consult Note (Signed)
Kelly Wright is a 83 y.o. female  [id number]  Primary Cardiologist: Dr. Adrian Blackwater  Reason for Consultation: Atrial fibrillation with rapid rate response  HPI: 83yo female with known history of paroxysmal atrial fibrillation, gastric ulcer, DVT, and lung disease. She is 3 days status post hip surgery and developed atrial fibrillation with rapid rate response of 120s-180s. She responded to IV diltiazem and is currently fluctuating between rate controlled afib and afib with rapid ventricular response.    Review of Systems: Pt reports hip pain and palpitations however she seems disoriented and difficult to arouse.   Past Medical History:  Diagnosis Date  . Arthritis   . Atrial fibrillation (HCC)   . Cerebral vascular accident (HCC)   . DVT (deep venous thrombosis) (HCC)   . Dysrhythmia   . GERD (gastroesophageal reflux disease)   . Interstitial lung disease (HCC)   . Osteoporosis     Medications Prior to Admission  Medication Sig Dispense Refill  . aspirin EC 81 MG tablet Take 1 tablet (81 mg total) by mouth daily. 30 tablet 0  . citalopram (CELEXA) 20 MG tablet Take 30 mg by mouth at bedtime.     [name] CRANBERRY CONCENTRATE PO Take 500 mg by mouth daily.     . folic acid (FOLVITE) 400 MCG tablet Take 400 mcg by mouth at bedtime.    . methotrexate (RHEUMATREX) 2.5 MG tablet Take 12.5 mg by mouth every Wednesday.     . ciprofloxacin (CIPRO) 250 MG tablet Take 250 mg by mouth 2 (two) times daily.    [name] donepezil (ARICEPT) 10 MG tablet Take 10 mg by mouth at bedtime.    [name] loratadine (CLARITIN) 10 MG tablet Take 10 mg by mouth daily.    . memantine (NAMENDA) 10 MG tablet Take 10 mg by mouth 2 (two) times daily.       [name] acetaminophen  500 mg Oral Q6H  . Chlorhexidine Gluconate Cloth  6 each Topical Q0600  . citalopram  30 mg Oral QHS  . docusate sodium  100 mg Oral BID  . donepezil  10 mg Oral QHS  . folic acid  500 mcg Oral QHS  . loratadine  10 mg Oral Daily  . memantine  10  mg Oral BID  . methotrexate  12.5 mg Oral Q Wed  . mupirocin ointment  1 application Nasal BID  . pantoprazole  40 mg Oral BID  . pneumococcal 23 valent vaccine  0.5 mL Intramuscular Tomorrow-1000  . polyethylene glycol  17 g Oral BID    Infusions: . sodium chloride 100 mL/hr at 04/15/17 1204  .  ceFAZolin (ANCEF) IV      No Known Allergies  Social History   Social History  . Marital status: Widowed    Spouse name: N/A  . Number of children: N/A  . Years of education: N/A   Occupational History  . Not on file.   Social History Main Topics  . Smoking status: Never Smoker  . Smokeless tobacco: Never Used  . Alcohol use No  . Drug use: No  . Sexual activity: Not on file   Other Topics Concern  . Not on file   Social History Narrative  . No narrative on file    Family History  Problem Relation Age of Onset  . Hodgkin's lymphoma Mother   . Cancer - Lung Father   . CVA Other     PHYSICAL EXAM: Vitals:   04/15/17 1200 04/15/17 1300  BP: 123/64 128/66  Pulse: 88 71  Resp: 16 (!) 22  Temp:    SpO2: 97% 95%     Intake/Output Summary (Last 24 hours) at 04/15/17 1532 Last data filed at 04/14/17 1800  Gross per 24 hour  Intake              400 ml  Output                0 ml  Net              400 ml    General:  Well appearing. No respiratory difficulty HEENT: normal Neck: supple. no JVD. Carotids 2+ bilat; no bruits. No lymphadenopathy or thryomegaly appreciated. Cor: PMI nondisplaced. Regular rate & rhythm. No rubs, gallops or murmurs. Lungs: clear Abdomen: soft, nontender, nondistended. No hepatosplenomegaly. No bruits or masses. Good bowel sounds. Extremities: no cyanosis, clubbing, rash, edema Neuro: Disoriented, unable to answer questions coherently   ECG: Rhythm is fluctuating between atrial flutter and NSR, rate is between 75 and 120bpm.   Results for orders placed or performed during the hospital encounter of 04/11/17 (from the past 24 hour(s))   CBC     Status: Abnormal   Collection Time: 04/14/17  7:51 PM  Result Value Ref Range   WBC 7.1 3.6 - 11.0 K/uL   RBC 2.75 (L) 3.80 - 5.20 MIL/uL   Hemoglobin 8.8 (L) 12.0 - 16.0 g/dL   HCT 79.0 (L) 24.0 - 97.3 %   MCV 94.9 80.0 - 100.0 fL   MCH 32.1 26.0 - 34.0 pg   MCHC 33.8 32.0 - 36.0 g/dL   RDW 53.2 (H) 99.2 - 42.6 %   Platelets 91 (L) 150 - 440 K/uL  Lactate dehydrogenase     Status: None   Collection Time: 04/14/17  7:51 PM  Result Value Ref Range   LDH 192 98 - 192 U/L  DAT, polyspecific, AHG ([facility] only)     Status: None   Collection Time: 04/14/17  7:51 PM  Result Value Ref Range   Polyspecific AHG test NEG   Ferritin     Status: None   Collection Time: 04/14/17  7:51 PM  Result Value Ref Range   Ferritin 235 11 - 307 ng/mL  Iron and TIBC     Status: Abnormal   Collection Time: 04/14/17  7:51 PM  Result Value Ref Range   Iron 67 28 - 170 ug/dL   TIBC 834 (L) 196 - 222 ug/dL   Saturation Ratios 35 (H) 10.4 - 31.8 %   UIBC 123 ug/dL  Folate     Status: None   Collection Time: 04/14/17  7:51 PM  Result Value Ref Range   Folate 9.3 >5.9 ng/mL  Vitamin B12     Status: Abnormal   Collection Time: 04/14/17  7:51 PM  Result Value Ref Range   Vitamin B-12 1,690 (H) 180 - 914 pg/mL  Basic metabolic panel     Status: Abnormal   Collection Time: 04/15/17  1:55 AM  Result Value Ref Range   Sodium 140 135 - 145 mmol/L   Potassium 3.5 3.5 - 5.1 mmol/L   Chloride 111 101 - 111 mmol/L   CO2 24 22 - 32 mmol/L   Glucose, Bld 125 (H) 65 - 99 mg/dL   BUN 12 6 - 20 mg/dL   Creatinine, Ser 9.79 0.44 - 1.00 mg/dL   Calcium 7.5 (L) 8.9 - 10.3 mg/dL   GFR calc non Af Amer >  60 >60 mL/min   GFR calc Af Amer >60 >60 mL/min   Anion gap 5 5 - 15  CBC     Status: Abnormal   Collection Time: 04/15/17  1:55 AM  Result Value Ref Range   WBC 7.0 3.6 - 11.0 K/uL   RBC 2.72 (L) 3.80 - 5.20 MIL/uL   Hemoglobin 8.6 (L) 12.0 - 16.0 g/dL   HCT 01.0 (L) 93.2 - 35.5 %   MCV 92.7 80.0 -  100.0 fL   MCH 31.6 26.0 - 34.0 pg   MCHC 34.0 32.0 - 36.0 g/dL   RDW 73.2 (H) 20.2 - 54.2 %   Platelets 98 (L) 150 - 440 K/uL  Hepatic function panel     Status: Abnormal   Collection Time: 04/15/17  1:55 AM  Result Value Ref Range   Total Protein 5.1 (L) 6.5 - 8.1 g/dL   Albumin 2.6 (L) 3.5 - 5.0 g/dL   AST 23 15 - 41 U/L   ALT 7 (L) 14 - 54 U/L   Alkaline Phosphatase 40 38 - 126 U/L   Total Bilirubin 1.0 0.3 - 1.2 mg/dL   Bilirubin, Direct 0.2 0.1 - 0.5 mg/dL   Indirect Bilirubin 0.8 0.3 - 0.9 mg/dL  Hemoglobin and hematocrit, blood     Status: Abnormal   Collection Time: 04/15/17  9:56 AM  Result Value Ref Range   Hemoglobin 9.0 (L) 12.0 - 16.0 g/dL   HCT 70.6 (L) 23.7 - 62.8 %  Glucose, capillary     Status: Abnormal   Collection Time: 04/15/17 11:15 AM  Result Value Ref Range   Glucose-Capillary 139 (H) 65 - 99 mg/dL   No results found.   ASSESSMENT AND PLAN: Paroxsymal atrial fibrillation 3 days status post hip surgery with rates up to 180s.  Currently rate is fluctuating between 75 and 120bpm. Ordered 12-lead EKG and Echo. Not a candidate for anticoagulation due to history of gastric ulcer and persistent anemia. Will start amiodarone 400mg  once daily for rate control and will continue to follow.  , NP-C

## 2017-04-15 NOTE — Progress Notes (Signed)
Kelly Repress, MD [address]  [address]  [place], [place] [address]  Main: [phone]  Fax: [phone] Pager: [phone]   Kelly Wright is being followed for anemia Day 1 of follow up    Subjective: Transferred back to ICU this AM after rapid response for RVR. Per her daughter and nurse, she did not have a BM yet. No episodes of rectal bleeding. Hb remained stable and improved. She is getting miralax, colace and suppository Denies abd pain/n/v   Objective: Vital signs in last 24 hours: Vitals:   04/15/17 1515 04/15/17 1602 04/15/17 1701 04/15/17 1800  BP: 101/70 127/65 129/71 132/81  Pulse: 72 78 74 80  Resp: 20 20 (!) 24 16  Temp: 98.5 F (36.9 C)     TempSrc: Oral     SpO2: 94% 99% 98% 97%  Weight:      Height:       Weight change:   Intake/Output Summary (Last 24 hours) at 04/15/17 1857 Last data filed at 04/15/17 1800  Gross per 24 hour  Intake             3120 ml  Output              700 ml  Net             2420 ml     Exam: Heart:: Regular rate and rhythm or S1S2 present Lungs: clear to auscultation Abdomen: soft, nontender, normal bowel sounds   Lab Results: @LABTEST2 @ Micro Results: Recent Results (from the past 240 hour(s))  MRSA PCR Screening     Status: Abnormal   Collection Time: 04/12/17 12:10 AM  Result Value Ref Range Status   MRSA by PCR POSITIVE (A) NEGATIVE Final    Comment:        The GeneXpert MRSA Assay (FDA approved for NASAL specimens only), is one component of a comprehensive MRSA colonization surveillance program. It is not intended to diagnose MRSA infection nor to guide or monitor treatment for MRSA infections. RESULT CALLED TO, READ BACK BY AND VERIFIED WITH: ANESSA MACROHON AT 06/12/17 04/12/17.PMH    Studies/Results: No results found. Medications: I have reviewed the patient's current medications. Scheduled Meds: . acetaminophen  500 mg Oral Q6H  . amiodarone  400 mg Oral Daily  . Chlorhexidine  Gluconate Cloth  6 each Topical Q0600  . citalopram  30 mg Oral QHS  . docusate sodium  100 mg Oral BID  . donepezil  10 mg Oral QHS  . folic acid  500 mcg Oral QHS  . loratadine  10 mg Oral Daily  . memantine  10 mg Oral BID  . methotrexate  12.5 mg Oral Q Wed  . mupirocin ointment  1 application Nasal BID  . pantoprazole  40 mg Oral BID  . pneumococcal 23 valent vaccine  0.5 mL Intramuscular Tomorrow-1000  . polyethylene glycol  17 g Oral BID   Continuous Infusions: . sodium chloride 100 mL/hr at 04/15/17 1204  .  ceFAZolin (ANCEF) IV     PRN Meds:.bisacodyl, cyclobenzaprine, diphenhydrAMINE, HYDROcodone-acetaminophen, magnesium hydroxide, metoCLOPramide **OR** metoCLOPramide (REGLAN) injection, metoprolol tartrate, ondansetron **OR** [DISCONTINUED] ondansetron (ZOFRAN) IV, sodium phosphate   Assessment: Active Problems:   Rheumatoid arthritis (HCC)   Atrial fibrillation (HCC)   Dementia   Closed displaced intertrochanteric fracture of right femur (HCC) Kelly Wright is a 83 y.o. female with moderate dementia, A. Fib, history of DVT, not on anticoagulation, presented with right intertrochanteric fracture status post ORIF,now  with acute anemia and thrombocytopenia. Her MCV is normal. She does not have active GI bleeding or recent GI bleed. Her acute anemia and thrombocytopenia are probably secondary to blood loss from recent hip fracture resulting in underlying hematoma and there by consumption of RBCs and platelets. Her iron studies are not consistent with IDA  Plan:  I do not recommend endoscopy evaluation at this time as patient is not actively bleeding, nor she has IDA Treat constipation aggressively  I will sign off for now. Call Kelly Wright back with questions/concerns    LOS: 4 days   Avrohom Mckelvin 04/15/2017, 6:57 PM

## 2017-04-15 NOTE — Progress Notes (Signed)
Physical Therapy Treatment Patient Details Name: Kelly Wright MRN: [id number] DOB: [birth year] Today's Date: 04/15/2017    History of Present Illness 83 y/o female who lives at home with 24/7 caregivers.  She had a fall with R hip fx and underwent ORIF 10/1.  History includes Afib, CVA, dementia.    PT Comments    Pt went to CCU yesterday but is back on the ortho floor.  Spoke with attending MD who okayed continuation of PT.  Pt's BP has stabilized since eval on Monday as well as other vitals.  Pt continues to do relatively poorly with PT, she was able to tolerate some supine exercises with less apparent pain today, but is still very functionally limited and needed total assist to get to the recliner.  Pt needed a lot of cuing and reinforcement to do even basic activities/exercises.  Essentially unable to do any mobility/standing/walking w/o max/total assist.   Follow Up Recommendations  SNF     Equipment Recommendations       Recommendations for Other Services       Precautions / Restrictions Precautions Precautions: Fall Restrictions RLE Weight Bearing: Weight bearing as tolerated    Mobility  Bed Mobility Overal bed mobility: Needs Assistance Bed Mobility: Supine to Sit     Supine to sit: Max assist     General bed mobility comments: Pt still required a lot of assist to get to sitting but tolerated it better today and was able to give minimally more assist  Transfers Overall transfer level: Needs assistance Equipment used: Rolling walker (2 wheeled) Transfers: Stand Pivot Transfers;Sit to/from Stand Sit to Stand: Max assist Stand pivot transfers: Total assist       General transfer comment: Pt needed heavy assist to get to "standing" but ultimately was unable to get LEs underneath her and was unable to move either LE w/o direct PT assist.  Ambulation/Gait                 Stairs            Wheelchair Mobility    Modified Rankin (Stroke Patients  Only)       Balance Overall balance assessment: Needs assistance Sitting-balance support: Bilateral upper extremity supported Sitting balance-Leahy Scale: Fair     Standing balance support: Bilateral upper extremity supported Standing balance-Leahy Scale: Zero Standing balance comment: Pt unable to achieve standing, showed zero confidence and was unable to follow instructions to try and get weight over walker or to really do anything in standing                            Cognition Arousal/Alertness: Awake/alert Behavior During Therapy: [facility] for tasks assessed/performed;Restless Overall Cognitive Status: History of cognitive impairments - at baseline                                        Exercises General Exercises - Lower Extremity Ankle Circles/Pumps: Strengthening;15 reps Quad Sets: Strengthening;15 reps Gluteal Sets: Strengthening;15 reps Short Arc Quad: AROM;15 reps;AAROM Heel Slides: AAROM;10 reps Hip ABduction/ADduction: 10 reps;AROM    General Comments        Pertinent Vitals/Pain Pain Assessment: 0-10 Pain Score: 6  (very minimal pain at rest, max pain with hip abd) Pain Location: R hip    Home Living  Prior Function            PT Goals (current goals can now be found in the care plan section) Progress towards PT goals: Progressing toward goals (still very limited functionally)    Frequency    BID      PT Plan Current plan remains appropriate    Co-evaluation              AM-PAC PT "6 Clicks" Daily Activity  Outcome Measure  Difficulty turning over in bed (including adjusting bedclothes, sheets and blankets)?: Unable Difficulty moving from lying on back to sitting on the side of the bed? : Unable Difficulty sitting down on and standing up from a chair with arms (e.g., wheelchair, bedside commode, etc,.)?: Unable Help needed moving to and from a bed to chair (including a  wheelchair)?: Total Help needed walking in hospital room?: Total Help needed climbing 3-5 steps with a railing? : Total 6 Click Score: 6    End of Session Equipment Utilized During Treatment: Gait belt Activity Tolerance:  (Pt limited 2/2 mental status, weakness, pain)   Nurse Communication: Mobility status PT Visit Diagnosis: Muscle weakness (generalized) (M62.81);Difficulty in walking, not elsewhere classified (R26.2)     Time: 2426-8341 PT Time Calculation (min) (ACUTE ONLY): 27 min  Charges:  $Therapeutic Exercise: 8-22 mins $Therapeutic Activity: 8-22 mins                    G Codes:       Malachi Pro, DPT 04/15/2017, 1:39 PM

## 2017-04-16 ENCOUNTER — Inpatient Hospital Stay (HOSPITAL_COMMUNITY)
Admit: 2017-04-16 | Discharge: 2017-04-16 | Disposition: A | Discharge status: Home / Self Care | Payer: Medicare Other | Attending: Registered Nurse | Admitting: Registered Nurse

## 2017-04-16 DIAGNOSIS — I340 Nonrheumatic mitral (valve) insufficiency: Secondary | ICD-10-CM

## 2017-04-16 LAB — HEMOGLOBIN AND HEMATOCRIT, BLOOD
HCT: 28.6 % — ABNORMAL LOW (ref 35.0–47.0)
HCT: 28.5 % — ABNORMAL LOW (ref 35.0–47.0)
HEMATOCRIT: 29.1 % — AB (ref 35.0–47.0)
Hemoglobin: 10 g/dL — ABNORMAL LOW (ref 12.0–16.0)
Hemoglobin: 9.7 g/dL — ABNORMAL LOW (ref 12.0–16.0)
Hemoglobin: 9.9 g/dL — ABNORMAL LOW (ref 12.0–16.0)

## 2017-04-16 LAB — MAGNESIUM: MAGNESIUM: 1.9 mg/dL (ref 1.7–2.4)

## 2017-04-16 LAB — POTASSIUM: POTASSIUM: 3.2 mmol/L — AB (ref 3.5–5.1)

## 2017-04-16 LAB — HAPTOGLOBIN: Haptoglobin: 85 mg/dL (ref 34–200)

## 2017-04-16 MED ORDER — AMIODARONE HCL 200 MG PO TABS
400.0000 mg | ORAL_TABLET | Freq: Two times a day (BID) | ORAL | Status: DC
  Administered 2017-04-16 – 2017-04-19 (×7): 400 mg via ORAL
  Filled 2017-04-16 (×7): qty 2

## 2017-04-16 MED ORDER — POTASSIUM CHLORIDE CRYS ER 20 MEQ PO TBCR
40.0000 meq | EXTENDED_RELEASE_TABLET | Freq: Once | ORAL | Status: AC
  Administered 2017-04-16: 40 meq via ORAL
  Filled 2017-04-16: qty 2

## 2017-04-16 NOTE — Progress Notes (Signed)
Agree with plan of weaning dosage of amiodrone. Patient paraxysmal atrial fibrillation post -operatively, and now in NSR with frequent APC. Upon discharge f/u in office next week, Thursday at 10 Am.

## 2017-04-16 NOTE — Progress Notes (Signed)
SUBJECTIVE: Pt is resting comfortably and denies chest pain or shortness of breath. Denies palpitations.   Vitals:   04/15/17 2200 04/15/17 2300 04/16/17 0000 04/16/17 0726  BP: 132/79 (!) 147/85 (!) 148/77 117/81  Pulse: 64 94 94 (!) 139  Resp: 19 (!) 24 20 18   Temp:    98 F (36.7 C)  TempSrc:      SpO2: (!) 87% 97% 96% 100%  Weight:      Height:        Intake/Output Summary (Last 24 hours) at 04/16/17 0843 Last data filed at 04/16/17 0500  Gross per 24 hour  Intake          4220.01 ml  Output             1650 ml  Net          2570.01 ml    LABS: Basic Metabolic Panel:  Recent Labs  06/16/17 0403 04/15/17 0155 04/15/17 1857 04/16/17 0207  NA 141 140  --   --   K 3.4* 3.5  --  3.2*  CL 113* 111  --   --   CO2 26 24  --   --   GLUCOSE 97 125*  --   --   BUN 15 12  --   --   CREATININE 0.87 0.75  --   --   CALCIUM 7.5* 7.5* 8.0*  --   MG 1.8  --  1.8 1.9   Liver Function Tests:  Recent Labs  04/15/17 0155  AST 23  ALT 7*  ALKPHOS 40  BILITOT 1.0  PROT 5.1*  ALBUMIN 2.6*   No results for input(s): LIPASE, AMYLASE in the last 72 hours. CBC:  Recent Labs  04/14/17 1951 04/15/17 0155  04/15/17 1857 04/16/17 0207  WBC 7.1 7.0  --   --   --   HGB 8.8* 8.6*  < > 9.5* 10.0*  HCT 26.1* 25.3*  < > 27.3* 29.1*  MCV 94.9 92.7  --   --   --   PLT 91* 98*  --   --   --   < > = values in this interval not displayed. Cardiac Enzymes: No results for input(s): CKTOTAL, CKMB, CKMBINDEX, TROPONINI in the last 72 hours. BNP: Invalid input(s): POCBNP D-Dimer: No results for input(s): DDIMER in the last 72 hours. Hemoglobin A1C: No results for input(s): HGBA1C in the last 72 hours. Fasting Lipid Panel: No results for input(s): CHOL, HDL, LDLCALC, TRIG, CHOLHDL, LDLDIRECT in the last 72 hours. Thyroid Function Tests: No results for input(s): TSH, T4TOTAL, T3FREE, THYROIDAB in the last 72 hours.  Invalid input(s): FREET3 Anemia Panel:  Recent Labs  04/14/17 1951  VITAMINB12 1,690*  FOLATE 9.3  FERRITIN 235  TIBC 190*  IRON 67     PHYSICAL EXAM General: Frail appearing, thin, pale HEENT:  Normocephalic and atramatic Neck:  No JVD.  Lungs: Clear bilaterally to auscultation and percussion. Heart: HRRR . Normal S1 and S2 without gallops or murmurs.  Abdomen: Bowel sounds are positive, abdomen soft and non-tender  Msk:  Back normal, normal gait. Normal strength and tone for age. Extremities: No clubbing, cyanosis or edema.   Neuro: Sleepy but arouses responds to  Psych: Responds approrpaitely  TELEMETRY: Afib 90-100bpm  ASSESSMENT AND PLAN: Atrial fibrillation with paroxysmal rapid ventricular rate. Continue Amiodarone 400mg  BID for 3 days with plan to wean to maintenance dose of 200mg /daily when rhythm responds. Continue to monitor hypokalemia and replace as indicated.Will need  close outpatient follow up.   Active Problems:   Rheumatoid arthritis (HCC)   Atrial fibrillation (HCC)   Dementia   Closed displaced intertrochanteric fracture of right femur (HCC)    Caroleen Hamman, NP-C 04/16/2017 8:43 AM

## 2017-04-16 NOTE — Progress Notes (Signed)
Transferred to 2-A as ordered.  Report called to receiving nurse.  Left floor via bed with all personal belongings.  Son bedside and aware of transfer.

## 2017-04-16 NOTE — Progress Notes (Signed)
Daily Progress Note   Patient Name: Kelly Wright       Date: 04/16/2017 DOB: [birth year]  Age: 83 y.o. MRN#: [id number] Attending Physician: Delfino Lovett, MD Primary Care Physician: Leanna Sato, MD Admit Date: 04/11/2017  Reason for Consultation/Follow-up: Establishing goals of care  Subjective: Patient is resting in bed asleep this morning upon entry into room. She denies pain this morning. Family at bedside. She is noted to have RVR. She has a history of  paroxysmal a -fib and has had episodes of RVR during her hospitalization which cardiology is following for.    Length of Stay: 5  Current Medications: Scheduled Meds:  . acetaminophen  500 mg Oral Q6H  . amiodarone  400 mg Oral Daily  . Chlorhexidine Gluconate Cloth  6 each Topical Q0600  . citalopram  30 mg Oral QHS  . docusate sodium  100 mg Oral BID  . donepezil  10 mg Oral QHS  . folic acid  500 mcg Oral QHS  . loratadine  10 mg Oral Daily  . memantine  10 mg Oral BID  . methotrexate  12.5 mg Oral Q Wed  . mupirocin ointment  1 application Nasal BID  . pantoprazole  40 mg Oral BID  . pneumococcal 23 valent vaccine  0.5 mL Intramuscular Tomorrow-1000  . polyethylene glycol  17 g Oral BID  . potassium chloride  40 mEq Oral Once    Continuous Infusions: . sodium chloride 100 mL/hr at 04/15/17 2152  .  ceFAZolin (ANCEF) IV      PRN Meds: bisacodyl, cyclobenzaprine, diphenhydrAMINE, HYDROcodone-acetaminophen, magnesium hydroxide, metoCLOPramide **OR** metoCLOPramide (REGLAN) injection, metoprolol tartrate, ondansetron **OR** [DISCONTINUED] ondansetron (ZOFRAN) IV, sodium phosphate  Physical Exam          Vital Signs: BP 117/81 (BP Location: Right Arm)   Pulse (!) 139   Temp 98 F (36.7 C)   Resp 18   Ht 5\' 9"  (1.753  m)   Wt 59.9 kg (132 lb)   SpO2 100%   BMI 19.49 kg/m  SpO2: SpO2: 100 % O2 Device: O2 Device: Not Delivered O2 Flow Rate: O2 Flow Rate (L/min): 8 L/min  Intake/output summary:  Intake/Output Summary (Last 24 hours) at 04/16/17 0839 Last data filed at 04/16/17 0500  Gross per 24 hour  Intake  4220.01 ml  Output             1650 ml  Net          2570.01 ml   LBM: Last BM Date: 04/11/17 Baseline Weight: Weight: 54.4 kg (120 lb) Most recent weight: Weight: 59.9 kg (132 lb)       Palliative Assessment/Data: 30%      Patient Active Problem List   Diagnosis Date Noted  . Closed displaced intertrochanteric fracture of right femur (HCC) 04/11/2017  . Infection of urinary tract 02/21/2016  . Dementia 02/21/2016  . Generalized weakness 02/21/2016  . Slurred speech 02/20/2016  . Depression 02/20/2016  . Broken ribs 08/11/2015  . UTI (lower urinary tract infection) 11/12/2014  . Encephalopathy, metabolic 11/12/2014  . Rheumatoid arthritis (HCC) 11/12/2014  . Chronic anticoagulation 11/12/2014  . Atrial fibrillation (HCC) 11/12/2014  . H/O: CVA (cerebrovascular accident) 11/12/2014    Palliative Care Assessment & Plan   Patient Profile: 83 y.o.femalewho is retired from [employer] with a history of  atrial fibrillation, a history of DVT, gastroesophageal reflux disease, and a CVA who lives at home with around the clock care provider.   Assessment: Kelly Wright is 4 days s/p IMN of right hip. She has been having episodes of A-fib with RVR. She has been working with PT as able with recommendations for SNF placement.    Recommendations/Plan:  No change to pain regimen at this time. Encourage frequent assessment of pain and medication dosing as indicated.  ? Initiation of DVT ppx as patient is s/p IMN with history of a-fib and DVT.   Will continue to follow, episodes of RVR making it difficult to work with PT. Concern for impending failure to thrive.    Goals of Care and Additional Recommendations: With family discussions, GOC at this point are focused on therapy and improvement since her ORIF.   Code Status:    Code Status Orders        Start     Ordered   04/12/17 0018  Full code  Continuous     04/12/17 0017    Code Status History    Date Active Date Inactive Code Status Order ID Comments User Context   02/20/2016  9:41 PM 02/21/2016  8:42 AM Full Code [id number]  Oralia Manis, MD Inpatient   08/11/2015  1:14 AM 08/12/2015  9:25 PM Full Code [id number]  Arnaldo Natal, MD ED   11/12/2014  2:35 AM 11/16/2014  9:54 PM Full Code [id number]  Vernell Barrier, MD Inpatient    Advance Directive Documentation     Most Recent Value  Type of Advance Directive  Living will  Pre-existing out of facility DNR order (yellow form or pink MOST form)  -  "MOST" Form in Place?  -       Prognosis:   < 12 months Concern for impending failure to thrive.   Discharge Planning:  To Be Determined  Care plan was discussed with Dr. Sherryll Burger  Thank you for allowing the Palliative Medicine Team to assist in the care of this patient.   Total Time 1 hour Prolonged Time Billed  No      Greater than 50%  of this time was spent counseling and coordinating care related to the above assessment and plan.  Morton Stall, NP 04/16/2017 9:12 AM Office: [phone] 7am-7pm  Call primary team after hours  Please contact Palliative Medicine Team phone at [phone] for questions and concerns.

## 2017-04-16 NOTE — Progress Notes (Signed)
Subjective: 4 Days Post-Op Procedure(s) (LRB): INTRAMEDULLARY (IM) NAIL INTERTROCHANTRIC (Right) Patient reports pain as mild.  Patient able to rest. Patient on cardiac floor, HR elevated. Having episodes of AFlutter and NSR. Cardiology following. Plan will be for discharge to SNF when medically appropriate. Negative for chest pain and shortness of breath Fever: no Gastrointestinal:Negative for nausea and vomiting  Objective: Vital signs in last 24 hours: Temp:  [98 F (36.7 C)-98.9 F (37.2 C)] 98 F (36.7 C) (10/05 0726) Pulse Rate:  [64-197] 139 (10/05 0726) Resp:  [16-24] 18 (10/05 0726) BP: (101-148)/(64-88) 117/81 (10/05 0726) SpO2:  [87 %-100 %] 100 % (10/05 0726)  Intake/Output from previous day:  Intake/Output Summary (Last 24 hours) at 04/16/17 0933 Last data filed at 04/16/17 0500  Gross per 24 hour  Intake          4220.01 ml  Output             1650 ml  Net          2570.01 ml    Intake/Output this shift: No intake/output data recorded.  Labs:  Recent Labs  04/14/17 1951 04/15/17 0155 04/15/17 0956 04/15/17 1857 04/16/17 0207  HGB 8.8* 8.6* 9.0* 9.5* 10.0*    Recent Labs  04/14/17 1951 04/15/17 0155  04/15/17 1857 04/16/17 0207  WBC 7.1 7.0  --   --   --   RBC 2.75* 2.72*  --   --   --   HCT 26.1* 25.3*  < > 27.3* 29.1*  PLT 91* 98*  --   --   --   < > = values in this interval not displayed.  Recent Labs  04/14/17 0403 04/15/17 0155 04/15/17 1857 04/16/17 0207  NA 141 140  --   --   K 3.4* 3.5  --  3.2*  CL 113* 111  --   --   CO2 26 24  --   --   BUN 15 12  --   --   CREATININE 0.87 0.75  --   --   GLUCOSE 97 125*  --   --   CALCIUM 7.5* 7.5* 8.0*  --    No results for input(s): LABPT, INR in the last 72 hours.   EXAM General - Patient is Alert, Appropriate, Lacking and pale this AM Extremity - ABD soft Sensation intact distally Intact pulses distally Dorsiflexion/Plantar flexion intact Incision: dressing C/D/I No  cellulitis present Dressing/Incision - clean, dry, scant drainage Motor Function - intact, moving foot and toes well on exam.     Past Medical History:  Diagnosis Date  . Arthritis   . Atrial fibrillation (HCC)   . Cerebral vascular accident (HCC)   . DVT (deep venous thrombosis) (HCC)   . Dysrhythmia   . GERD (gastroesophageal reflux disease)   . Interstitial lung disease (HCC)   . Osteoporosis     Assessment/Plan: 4 Days Post-Op Procedure(s) (LRB): INTRAMEDULLARY (IM) NAIL INTERTROCHANTRIC (Right) Active Problems:   Rheumatoid arthritis (HCC)   Atrial fibrillation (HCC)   Dementia   Closed displaced intertrochanteric fracture of right femur (HCC)  Estimated body mass index is 19.49 kg/m as calculated from the following:   Height as of this encounter: 5\' 9"  (1.753 m).   Weight as of this encounter: 59.9 kg (132 lb). Advance diet Up with therapy  Needs BM before discharge Hgb trending up. Hgb 10.0 Hypokalemia 3.2 Discharge patient pending medical clearance  DVT Prophylaxis - Foot Pumps and TED hose Weight-Bearing as tolerated  to right leg  Evon Slack, PA-C [facility] 04/16/2017, 9:33 AM

## 2017-04-16 NOTE — Progress Notes (Signed)
1        [employer] - Secretary at [facility]   PATIENT NAME: Kelly Wright    MR#:  [id number]  DATE OF BIRTH:  [birth year]  SUBJECTIVE:  CHIEF COMPLAINT:   Chief Complaint  Patient presents with  . Fall  Hb 9.7, no new issues but heart rate anywhere from 120-130s at rest REVIEW OF SYSTEMS:  Review of Systems  Constitutional: Positive for malaise/fatigue. Negative for chills, fever and weight loss.  HENT: Negative for nosebleeds and sore throat.   Eyes: Negative for blurred vision.  Respiratory: Negative for cough, shortness of breath and wheezing.   Cardiovascular: Negative for chest pain, orthopnea, leg swelling and PND.  Gastrointestinal: Negative for abdominal pain, constipation, diarrhea, heartburn, nausea and vomiting.  Genitourinary: Negative for dysuria and urgency.  Musculoskeletal: Positive for joint pain. Negative for back pain.  Skin: Negative for rash.  Neurological: Positive for weakness. Negative for dizziness, speech change, focal weakness and headaches.  Endo/Heme/Allergies: Does not bruise/bleed easily.  Psychiatric/Behavioral: Negative for depression.   DRUG ALLERGIES:  No Known Allergies VITALS:  Blood pressure 117/81, pulse (!) 139, temperature 98 F (36.7 C), resp. rate 18, height 5\' 9"  (1.753 m), weight 59.9 kg (132 lb), SpO2 100 %. PHYSICAL EXAMINATION:  Physical Exam  Constitutional: She is well-developed, well-nourished, and in no distress.  HENT:  Head: Normocephalic and atraumatic.  Eyes: Pupils are equal, round, and reactive to light. Conjunctivae and EOM are normal.  Neck: Normal range of motion. Neck supple. No tracheal deviation present. No thyromegaly present.  Cardiovascular: Normal heart sounds.  An irregularly irregular rhythm present. Tachycardia present.   Pulmonary/Chest: Effort normal and breath sounds normal. No respiratory distress. She has no wheezes. She exhibits no tenderness.  Abdominal: Soft. Bowel sounds are  normal. She exhibits no distension. There is no tenderness.  Musculoskeletal:       Right hip: She exhibits normal range of motion, normal strength, no tenderness and no bony tenderness.  Surgical site dressing looks clean  Neurological: She is alert. No cranial nerve deficit.  confused  Skin: Skin is warm and dry. No rash noted.  Psychiatric: Mood and affect normal.   LABORATORY PANEL:  Female CBC  Recent Labs Lab 04/15/17 0155  04/16/17 1013  WBC 7.0  --   --   HGB 8.6*  < > 9.7*  HCT 25.3*  < > 28.6*  PLT 98*  --   --   < > = values in this interval not displayed. ------------------------------------------------------------------------------------------------------------------ Chemistries   Recent Labs Lab 04/15/17 0155 04/15/17 1857 04/16/17 0207  NA 140  --   --   K 3.5  --  3.2*  CL 111  --   --   CO2 24  --   --   GLUCOSE 125*  --   --   BUN 12  --   --   CREATININE 0.75  --   --   CALCIUM 7.5* 8.0*  --   MG  --  1.8 1.9  AST 23  --   --   ALT 7*  --   --   ALKPHOS 40  --   --   BILITOT 1.0  --   --    RADIOLOGY:  No results found. ASSESSMENT AND PLAN:  83 y.o. female with a history of chronic atrial fibrillation, a history of DVT, gastroesophageal reflux disease, and a CVA who lives independently. Apparently she lost her balance descending several stairs, causing her  to land on her right hip. She was brought to the emergency room where x-rays demonstrated a displaced 4 part fracture of the intertrochanteric region of her right hip  *Paroxysmal A. fib - HR up to 110-120s at rest -Cardiology started amiodarone 400 mg twice a day to better control rate - not a good candidate for anticoagulation  *Anemia/pancytopenia- acute on chronic blood loss -Hold her Lovenox as platelet counts dropped from 130->87->78 - HIT panel negative. stop Lovenox - Ibuprofen  -Likely from blood loss while at surgery - Hemoglobin dropped s/p one PRBC. hemoglobin 9.7 today - has  h/o DVT, A.fib and CVA - high risk for blood clots -SCDs for now  *Right hip fracture -Reduction and internal fixation of displaced intertrochanteric right hip fracture on 10/1 -Physical therapy as tolerated - pain control  * Hypokalemia - replete and recheck  * Acute kidney injury -IV fluid hydration -Resolved with hydration  . Rheumatoid arthritis (HCC) -Stable  . Moderate Dementia -Stable     Overall poor prognosis (had long d/w daughter [name] over phone) - c/s palliative care   Physical therapy recommends short-term rehab/skilled nursing facility   All the records are reviewed and case discussed with Care Management/Social Worker. Management plans discussed with the patient, nursing (with daughter [name] over phone), Dr Belia Heman and they are in agreement.  CODE STATUS: Full Code  TOTAL TIME TAKING CARE OF THIS PATIENT: 35 minutes.   More than 50% of the time was spent in counseling/coordination of care: YES  POSSIBLE D/C IN 3-4 DAYS, DEPENDING ON CLINICAL CONDITION    Delfino Lovett M.D on 04/16/2017 at 4:17 PM  Between 7am to 6pm - Pager - [phone]  After 6pm go to [email or web address] - Social research officer, government  [employer] [facility]  Office  [phone]  CC: Primary care physician; Leanna Sato, MD  Note: This dictation was prepared with Dragon dictation along with smaller phrase technology. Any transcriptional errors that result from this process are unintentional.

## 2017-04-16 NOTE — Progress Notes (Signed)
MEDICATION RELATED CONSULT NOTE  Pharmacy Consult for electrolyte management   Pharmacy consulted for electrolyte management for 83 yo female admitted to the ICU s/p right hip fracture repair now with acute on chronic blood loss. Patient has atrial fibrillation.  Plan:  Mg 1.9, K 3.2. Will give KCl 40 mEq PO once  Will recheck electrolytes with AM labs tomorrow.  No Known Allergies  Patient Measurements: Height: 5\' 9"  (175.3 cm) Weight: 132 lb (59.9 kg) IBW/kg (Calculated) : 66.2  Vital Signs: Temp: 98 F (36.7 C) (10/05 0726) BP: 117/81 (10/05 0726) Pulse Rate: 139 (10/05 0726) Intake/Output from previous day: 10/04 0701 - 10/05 0700 In: 4220 [P.O.:720; I.V.:3500] Out: 1650 [Urine:1650] Intake/Output from this shift: No intake/output data recorded.  Labs:  Recent Labs  04/14/17 0403  04/14/17 1951 04/15/17 0155  04/15/17 1857 04/16/17 0207 04/16/17 1013  WBC 7.2  --  7.1 7.0  --   --   --   --   HGB 6.8*  < > 8.8* 8.6*  < > 9.5* 10.0* 9.7*  HCT 20.5*  < > 26.1* 25.3*  < > 27.3* 29.1* 28.6*  PLT 78*  --  91* 98*  --   --   --   --   CREATININE 0.87  --   --  0.75  --   --   --   --   MG 1.8  --   --   --   --  1.8 1.9  --   ALBUMIN  --   --   --  2.6*  --   --   --   --   PROT  --   --   --  5.1*  --   --   --   --   AST  --   --   --  23  --   --   --   --   ALT  --   --   --  7*  --   --   --   --   ALKPHOS  --   --   --  40  --   --   --   --   BILITOT  --   --   --  1.0  --   --   --   --   BILIDIR  --   --   --  0.2  --   --   --   --   IBILI  --   --   --  0.8  --   --   --   --   < > = values in this interval not displayed. Estimated Creatinine Clearance: 51.3 mL/min (by C-G formula based on SCr of 0.75 mg/dL).  Pharmacy will continue to monitor and adjust per consult.   06/16/17, PharmD, BCPS Clinical Pharmacist 04/16/2017,11:50 AM

## 2017-04-17 LAB — HEMOGLOBIN AND HEMATOCRIT, BLOOD
HCT: 29.1 % — ABNORMAL LOW (ref 35.0–47.0)
HEMATOCRIT: 28.3 % — AB (ref 35.0–47.0)
HEMOGLOBIN: 9.9 g/dL — AB (ref 12.0–16.0)
HEMOGLOBIN: 9.9 g/dL — AB (ref 12.0–16.0)

## 2017-04-17 LAB — DIRECT PLATELET ANTIBODY
PLT ASSOC. ANTI-IA/IIA: NEGATIVE
PLT ASSOC. ANTI-IB/IX: NEGATIVE
Plt Assoc. Anti-IIB/IIIA: NEGATIVE

## 2017-04-17 LAB — CBC WITH DIFFERENTIAL/PLATELET
BASOS ABS: 0 10*3/uL (ref 0–0.1)
Basophils Relative: 0 %
EOS ABS: 0.2 10*3/uL (ref 0–0.7)
Eosinophils Relative: 2 %
HCT: 27.4 % — ABNORMAL LOW (ref 35.0–47.0)
HEMOGLOBIN: 9.4 g/dL — AB (ref 12.0–16.0)
LYMPHS ABS: 0.9 10*3/uL — AB (ref 1.0–3.6)
LYMPHS PCT: 13 %
MCH: 32.1 pg (ref 26.0–34.0)
MCHC: 34.3 g/dL (ref 32.0–36.0)
MCV: 93.5 fL (ref 80.0–100.0)
Monocytes Absolute: 0.3 10*3/uL (ref 0.2–0.9)
Monocytes Relative: 4 %
NEUTROS PCT: 81 %
Neutro Abs: 5.6 10*3/uL (ref 1.4–6.5)
Platelets: 154 10*3/uL (ref 150–440)
RBC: 2.94 MIL/uL — AB (ref 3.80–5.20)
RDW: 17.1 % — ABNORMAL HIGH (ref 11.5–14.5)
WBC: 7 10*3/uL (ref 3.6–11.0)

## 2017-04-17 LAB — ECHOCARDIOGRAM COMPLETE
HEIGHTINCHES: 69 in
WEIGHTICAEL: 2112 [oz_av]

## 2017-04-17 LAB — MAGNESIUM: Magnesium: 2 mg/dL (ref 1.7–2.4)

## 2017-04-17 LAB — POTASSIUM: POTASSIUM: 3.6 mmol/L (ref 3.5–5.1)

## 2017-04-17 LAB — PHOSPHORUS: PHOSPHORUS: 1.4 mg/dL — AB (ref 2.5–4.6)

## 2017-04-17 MED ORDER — K PHOS MONO-SOD PHOS DI & MONO 155-852-130 MG PO TABS
500.0000 mg | ORAL_TABLET | Freq: Four times a day (QID) | ORAL | Status: AC
  Administered 2017-04-17 (×3): 500 mg via ORAL
  Filled 2017-04-17 (×4): qty 2

## 2017-04-17 NOTE — Progress Notes (Signed)
1        [employer] -  at [facility]   PATIENT NAME: Kelly Wright    MR#:  [id number]  DATE OF BIRTH:  Jan 26, 1933  SUBJECTIVE:  CHIEF COMPLAINT:   Chief Complaint  Patient presents with  . Fall  Hb 9.9, Patient has baseline dementia and confused, son-in-law at bedside REVIEW OF SYSTEMS:  Review of Systems  Unable to perform ROS: Dementia  Constitutional: Negative for chills, fever, malaise/fatigue and weight loss.  HENT: Negative for nosebleeds and sore throat.   Eyes: Negative for blurred vision.  Respiratory: Negative for cough, shortness of breath and wheezing.   Cardiovascular: Negative for chest pain, orthopnea, leg swelling and PND.  Gastrointestinal: Negative for abdominal pain, constipation, diarrhea, heartburn, nausea and vomiting.  Genitourinary: Negative for dysuria and urgency.  Musculoskeletal: Positive for joint pain. Negative for back pain.  Skin: Negative for rash.  Neurological: Positive for weakness. Negative for dizziness, speech change, focal weakness and headaches.  Endo/Heme/Allergies: Does not bruise/bleed easily.  Psychiatric/Behavioral: Negative for depression.   DRUG ALLERGIES:  No Known Allergies VITALS:  Blood pressure (!) 146/95, pulse 85, temperature (!) 97.5 F (36.4 C), temperature source Oral, resp. rate 18, height 5\' 9"  (1.753 m), weight 59.9 kg (132 lb), SpO2 99 %. PHYSICAL EXAMINATION:  Physical Exam  Constitutional: She is well-developed, well-nourished, and in no distress.  HENT:  Head: Normocephalic and atraumatic.  Eyes: Pupils are equal, round, and reactive to light. Conjunctivae and EOM are normal.  Neck: Normal range of motion. Neck supple. No tracheal deviation present. No thyromegaly present.  Cardiovascular: Normal heart sounds.  An irregularly irregular rhythm present. Tachycardia present.   Pulmonary/Chest: Effort normal and breath sounds normal. No respiratory distress. She has no wheezes. She exhibits  no tenderness.  Abdominal: Soft. Bowel sounds are normal. She exhibits no distension. There is no tenderness.  Musculoskeletal:       Right hip: She exhibits normal range of motion, normal strength, no tenderness and no bony tenderness.  Surgical site dressing looks clean  Neurological: She is alert. No cranial nerve deficit.  confused  Skin: Skin is warm and dry. No rash noted.  Psychiatric: Mood and affect normal.   LABORATORY PANEL:  Female CBC  Recent Labs Lab 04/17/17 0217 04/17/17 1002  WBC 7.0  --   HGB 9.4* 9.9*  HCT 27.4* 29.1*  PLT 154  --    ------------------------------------------------------------------------------------------------------------------ Chemistries   Recent Labs Lab 04/15/17 0155 04/15/17 1857  04/17/17 0217  NA 140  --   --   --   K 3.5  --   < > 3.6  CL 111  --   --   --   CO2 24  --   --   --   GLUCOSE 125*  --   --   --   BUN 12  --   --   --   CREATININE 0.75  --   --   --   CALCIUM 7.5* 8.0*  --   --   MG  --  1.8  < > 2.0  AST 23  --   --   --   ALT 7*  --   --   --   ALKPHOS 40  --   --   --   BILITOT 1.0  --   --   --   < > = values in this interval not displayed. RADIOLOGY:  No results found. ASSESSMENT AND PLAN:  83 y.o. female with a history of chronic atrial fibrillation, a history of DVT, gastroesophageal reflux disease, and a CVA who lives independently. Apparently she lost her balance descending several stairs, causing her to land on her right hip. She was brought to the emergency room where x-rays demonstrated a displaced 4 part fracture of the intertrochanteric region of her right hip  *Paroxysmal A. fib - HR Is better at 95 today -Cardiology started amiodarone 400 mg twice a day to better control rate, closely monitor heart rate - not a good candidate for anticoagulation -Follow up with Dr. Welton Flakes  *Anemia/pancytopenia- acute on chronic blood loss -Hold her Lovenox as platelet counts dropped from 130->87->78 - HIT  panel negative. stop Lovenox - Ibuprofen  -Likely from blood loss while at surgery - Hemoglobin dropped s/p one PRBC. hemoglobin 9.7-9.9 today - has h/o DVT, A.fib and CVA - high risk for blood clots -SCDs for now  *Right hip fracture -Reduction and internal fixation of displaced intertrochanteric right hip fracture on 10/1 -Physical therapy consult pending - pain control  * Hypokalemia - replete and recheck as needed  * Acute kidney injury -IV fluid hydration -Resolved with hydration -BMP in a.m.  [name] Rheumatoid arthritis (HCC) -Stable  . Moderate Dementia -Stable     Overall poor prognosis  - c/s palliative care   Physical therapy recommends short-term rehab/skilled nursing facility   All the records are reviewed and case discussed with Care Management/Social Worker. Management plans discussed with the patient, son-in-law at bedside they are in agreement.  CODE STATUS: Full Code  TOTAL TIME TAKING CARE OF THIS PATIENT: 35 minutes.   More than 50% of the time was spent in counseling/coordination of care: YES  POSSIBLE D/C IN 3-4 DAYS, DEPENDING ON CLINICAL CONDITION    Ramonita Lab M.D on 04/17/2017 at 10:47 AM  Between 7am to 6pm - Pager - [phone]   After 6pm go to [email or web address] - Social research officer, government  [employer] [facility]  Office  [phone]  CC: Primary care physician; Leanna Sato, MD  Note: This dictation was prepared with Dragon dictation along with smaller phrase technology. Any transcriptional errors that result from this process are unintentional.

## 2017-04-17 NOTE — Progress Notes (Signed)
SUBJECTIVE: Pt is weak and tired with hip pain but denies chest pain or palpitations.    Vitals:   04/16/17 0000 04/16/17 0726 04/16/17 1947 04/17/17 0525  BP: (!) 148/77 117/81 121/65 123/77  Pulse: 94 (!) 139 (!) 113 (!) 109  Resp: 20 18 18 18   Temp:  98 F (36.7 C) 99 F (37.2 C) (!) 97.5 F (36.4 C)  TempSrc:   Oral Oral  SpO2: 96% 100% 98% 100%  Weight:      Height:        Intake/Output Summary (Last 24 hours) at 04/17/17 0834 Last data filed at 04/17/17 0654  Gross per 24 hour  Intake          2268.33 ml  Output              850 ml  Net          1418.33 ml    LABS: Basic Metabolic Panel:  Recent Labs  06/17/17 0155  04/15/17 1857 04/16/17 0207 04/17/17 0217  NA 140  --   --   --   --   K 3.5  --   --  3.2* 3.6  CL 111  --   --   --   --   CO2 24  --   --   --   --   GLUCOSE 125*  --   --   --   --   BUN 12  --   --   --   --   CREATININE 0.75  --   --   --   --   CALCIUM 7.5*  --  8.0*  --   --   MG  --   < > 1.8 1.9 2.0  PHOS  --   --   --   --  1.4*  < > = values in this interval not displayed. Liver Function Tests:  Recent Labs  04/15/17 0155  AST 23  ALT 7*  ALKPHOS 40  BILITOT 1.0  PROT 5.1*  ALBUMIN 2.6*   No results for input(s): LIPASE, AMYLASE in the last 72 hours. CBC:  Recent Labs  04/15/17 0155  04/16/17 1938 04/17/17 0217  WBC 7.0  --   --  7.0  NEUTROABS  --   --   --  5.6  HGB 8.6*  < > 9.9* 9.4*  HCT 25.3*  < > 28.5* 27.4*  MCV 92.7  --   --  93.5  PLT 98*  --   --  154  < > = values in this interval not displayed. Cardiac Enzymes: No results for input(s): CKTOTAL, CKMB, CKMBINDEX, TROPONINI in the last 72 hours. BNP: Invalid input(s): POCBNP D-Dimer: No results for input(s): DDIMER in the last 72 hours. Hemoglobin A1C: No results for input(s): HGBA1C in the last 72 hours. Fasting Lipid Panel: No results for input(s): CHOL, HDL, LDLCALC, TRIG, CHOLHDL, LDLDIRECT in the last 72 hours. Thyroid Function Tests: No  results for input(s): TSH, T4TOTAL, T3FREE, THYROIDAB in the last 72 hours.  Invalid input(s): FREET3 Anemia Panel:  Recent Labs  04/14/17 1951  VITAMINB12 1,690*  FOLATE 9.3  FERRITIN 235  TIBC 190*  IRON 67     PHYSICAL EXAM General: Well developed, well nourished, in no acute distress HEENT:  Normocephalic and atramatic Neck:  No JVD.  Lungs: Clear bilaterally to auscultation and percussion. Heart: HRRR . Normal S1 and S2 without gallops or murmurs.  Abdomen: Bowel sounds are positive, abdomen  soft and non-tender  Msk:  Back normal, normal gait. Normal strength and tone for age. Extremities: No clubbing, cyanosis or edema.   Neuro: Alert and oriented X 3. Psych:  Good affect, responds appropriately  TELEMETRY: Atrial fibrillation 80-90bpm at rest  ASSESSMENT AND PLAN: Atrial fibrillation with paroxysmal rapid ventricular rate. Continue amiodarone 400mg  BID for at least two more days. Will plan to wean dose when rate responds or converts. May consider adding metoprolol if rate is still high. Anemia may be contributing factor. Will continue to follow.  Active Problems:   Rheumatoid arthritis (HCC)   Atrial fibrillation (HCC)   Dementia   Closed displaced intertrochanteric fracture of right femur (HCC)    , NP-C 04/17/2017 8:34 AM

## 2017-04-17 NOTE — Progress Notes (Signed)
MEDICATION RELATED CONSULT NOTE  Pharmacy Consult for electrolyte management   Pharmacy consulted for electrolyte management for 83 yo female admitted to the ICU s/p right hip fracture repair now with acute on chronic blood loss. Patient has atrial fibrillation.  Plan:  Will give Kphos neutral 2 tabs x 4 doses  Will recheck electrolytes with AM labs tomorrow.  No Known Allergies  Patient Measurements: Height: 5\' 9"  (175.3 cm) Weight: 132 lb (59.9 kg) IBW/kg (Calculated) : 66.2  Vital Signs: Temp: 97.5 F (36.4 C) (10/06 0525) Temp Source: Oral (10/06 0525) BP: 146/95 (10/06 0725) Pulse Rate: 85 (10/06 0725) Intake/Output from previous day: 10/05 0701 - 10/06 0700 In: 2268.3 [I.V.:2268.3] Out: 850 [Urine:850] Intake/Output from this shift: No intake/output data recorded.  Labs:  Recent Labs  04/14/17 1951 04/15/17 0155  04/15/17 1857 04/16/17 0207 04/16/17 1013 04/16/17 1938 04/17/17 0217  WBC 7.1 7.0  --   --   --   --   --  7.0  HGB 8.8* 8.6*  < > 9.5* 10.0* 9.7* 9.9* 9.4*  HCT 26.1* 25.3*  < > 27.3* 29.1* 28.6* 28.5* 27.4*  PLT 91* 98*  --   --   --   --   --  154  CREATININE  --  0.75  --   --   --   --   --   --   MG  --   --   --  1.8 1.9  --   --  2.0  PHOS  --   --   --   --   --   --   --  1.4*  ALBUMIN  --  2.6*  --   --   --   --   --   --   PROT  --  5.1*  --   --   --   --   --   --   AST  --  23  --   --   --   --   --   --   ALT  --  7*  --   --   --   --   --   --   ALKPHOS  --  40  --   --   --   --   --   --   BILITOT  --  1.0  --   --   --   --   --   --   BILIDIR  --  0.2  --   --   --   --   --   --   IBILI  --  0.8  --   --   --   --   --   --   < > = values in this interval not displayed. Estimated Creatinine Clearance: 51.3 mL/min (by C-G formula based on SCr of 0.75 mg/dL).   Sodium (mmol/L)  Date Value  04/15/2017 140  04/03/2013 141   Potassium (mmol/L)  Date Value  04/17/2017 3.6  04/03/2013 3.8   Calcium (mg/dL)  Date  Value  04/05/2013 8.0 (L)   Calcium, Total (mg/dL)  Date Value  58/30/9407 8.8   Albumin (g/dL)  Date Value  68/02/8109 2.6 (L)  04/03/2013 3.4     Pharmacy will continue to monitor and adjust per consult.   04/05/2013, PharmD, BCPS Clinical Pharmacist 04/17/2017,10:21 AM

## 2017-04-18 LAB — HEMOGLOBIN AND HEMATOCRIT, BLOOD
HEMATOCRIT: 25.8 % — AB (ref 35.0–47.0)
HEMOGLOBIN: 9 g/dL — AB (ref 12.0–16.0)

## 2017-04-18 LAB — BASIC METABOLIC PANEL
ANION GAP: 6 (ref 5–15)
BUN: 10 mg/dL (ref 6–20)
CALCIUM: 7.4 mg/dL — AB (ref 8.9–10.3)
CO2: 24 mmol/L (ref 22–32)
CREATININE: 0.42 mg/dL — AB (ref 0.44–1.00)
Chloride: 108 mmol/L (ref 101–111)
GFR calc Af Amer: >60 mL/min (ref >60)
GLUCOSE: 105 mg/dL — AB (ref 65–99)
Potassium: 3 mmol/L — ABNORMAL LOW (ref 3.5–5.1)
SODIUM: 138 mmol/L (ref 135–145)

## 2017-04-18 LAB — MAGNESIUM: Magnesium: 1.8 mg/dL (ref 1.7–2.4)

## 2017-04-18 LAB — POTASSIUM: Potassium: 3.2 mmol/L — ABNORMAL LOW (ref 3.5–5.1)

## 2017-04-18 LAB — PHOSPHORUS: Phosphorus: 3.3 mg/dL (ref 2.5–4.6)

## 2017-04-18 MED ORDER — POTASSIUM CHLORIDE CRYS ER 20 MEQ PO TBCR
40.0000 meq | EXTENDED_RELEASE_TABLET | Freq: Once | ORAL | Status: AC
  Administered 2017-04-18: 40 meq via ORAL
  Filled 2017-04-18: qty 2

## 2017-04-18 MED ORDER — MAGNESIUM SULFATE 2 GM/50ML IV SOLN
2.0000 g | Freq: Once | INTRAVENOUS | Status: AC
  Administered 2017-04-18: 2 g via INTRAVENOUS
  Filled 2017-04-18: qty 50

## 2017-04-18 MED ORDER — POTASSIUM CHLORIDE CRYS ER 20 MEQ PO TBCR
60.0000 meq | EXTENDED_RELEASE_TABLET | Freq: Once | ORAL | Status: AC
  Administered 2017-04-18: 60 meq via ORAL
  Filled 2017-04-18: qty 3

## 2017-04-18 NOTE — Progress Notes (Signed)
SUBJECTIVE: Pt is feeling well, no chest pain or shortness of breath.   Vitals:   04/17/17 0725 04/17/17 1215 04/17/17 1931 04/18/17 0522  BP: (!) 146/95 127/87 128/63 116/74  Pulse: 85 99 (!) 104 81  Resp: 18 18 17 18   Temp:   98 F (36.7 C) 97.8 F (36.6 C)  TempSrc:   Oral Oral  SpO2: 99% 100% 99% 98%  Weight:      Height:       No intake or output data in the 24 hours ending 04/18/17 0756  LABS: Basic Metabolic Panel:  Recent Labs  06/18/17 1857  04/17/17 0217 04/18/17 0159  NA  --   --   --  138  K  --   < > 3.6 3.0*  CL  --   --   --  108  CO2  --   --   --  24  GLUCOSE  --   --   --  105*  BUN  --   --   --  10  CREATININE  --   --   --  0.42*  CALCIUM 8.0*  --   --  7.4*  MG 1.8  < > 2.0 1.8  PHOS  --   --  1.4* 3.3  < > = values in this interval not displayed. Liver Function Tests: No results for input(s): AST, ALT, ALKPHOS, BILITOT, PROT, ALBUMIN in the last 72 hours. No results for input(s): LIPASE, AMYLASE in the last 72 hours. CBC:  Recent Labs  04/17/17 0217  04/17/17 1746 04/18/17 0159  WBC 7.0  --   --   --   NEUTROABS 5.6  --   --   --   HGB 9.4*  < > 9.9* 9.0*  HCT 27.4*  < > 28.3* 25.8*  MCV 93.5  --   --   --   PLT 154  --   --   --   < > = values in this interval not displayed. Cardiac Enzymes: No results for input(s): CKTOTAL, CKMB, CKMBINDEX, TROPONINI in the last 72 hours. BNP: Invalid input(s): POCBNP D-Dimer: No results for input(s): DDIMER in the last 72 hours. Hemoglobin A1C: No results for input(s): HGBA1C in the last 72 hours. Fasting Lipid Panel: No results for input(s): CHOL, HDL, LDLCALC, TRIG, CHOLHDL, LDLDIRECT in the last 72 hours. Thyroid Function Tests: No results for input(s): TSH, T4TOTAL, T3FREE, THYROIDAB in the last 72 hours.  Invalid input(s): FREET3 Anemia Panel: No results for input(s): VITAMINB12, FOLATE, FERRITIN, TIBC, IRON, RETICCTPCT in the last 72 hours.   PHYSICAL EXAM General: Well  developed, well nourished, in no acute distress HEENT:  Normocephalic and atramatic Neck:  No JVD.  Lungs: Clear bilaterally to auscultation and percussion. Heart: HRRR . Normal S1 and S2 without gallops or murmurs.  Abdomen: Bowel sounds are positive, abdomen soft and non-tender  Msk:  Back normal, normal gait. Normal strength and tone for age. Extremities: No clubbing, cyanosis or edema.   Neuro: Alert and oriented X 3. Psych:  Good affect, responds appropriately  TELEMETRY: Afib 82 bpm.  ASSESSMENT AND PLAN:Atrial fibrillation with paroxysmal RVR. Rate improving. Pt is feeling well. Recommend continue amiodarone and will decrease dose tomorrow. Will continue to follow.   Active Problems:   Rheumatoid arthritis (HCC)   Atrial fibrillation (HCC)   Dementia   Closed displaced intertrochanteric fracture of right femur (HCC)    06/18/17, NP-C 04/18/2017 7:56 AM

## 2017-04-18 NOTE — Evaluation (Signed)
Physical Therapy Re- Evaluation Patient Details Name: Kelly Wright MRN: [id number] DOB: 01-14-1933 Today's Date: 04/18/2017   History of Present Illness  83 y/o female who lives at home with 24/7 caregivers.  She had a fall with R hip fx and underwent ORIF 10/1.  History includes Afib, CVA, dementia.  Clinical Impression  Patient was being treated by therapy staff after repaired R hip fx, when she began developing elevated HR and was transferred to the CCU. Prior to that, she was largely a total assist for bed mobility and transfers. Today she is able to participate slightly more and provide some assistance with bed mobility. Once in standing she has no confidence or requisite strength in her RLE to take any meaningful steps, thus required a stand pivot transfer to get to the chair. She will benefit from SNF placement at discharge still secondary to marked functional limitations.     Follow Up Recommendations SNF    Equipment Recommendations       Recommendations for Other Services       Precautions / Restrictions Precautions Precautions: Fall Restrictions Weight Bearing Restrictions: Yes RLE Weight Bearing: Weight bearing as tolerated      Mobility  Bed Mobility Overal bed mobility: Needs Assistance Bed Mobility: Supine to Sit     Supine to sit: Min assist;Mod assist     General bed mobility comments: Patient is able to bring her LEs off the edge of the bed with minimal assistance, but requires assistance to manage her torso.   Transfers Overall transfer level: Needs assistance Equipment used: Rolling walker (2 wheeled) Transfers: Stand Pivot Transfers;Sit to/from Stand Sit to Stand: Max assist;Mod assist Stand pivot transfers: Total assist;Max assist       General transfer comment: Patient is able to stand, but unable to take any steps, and leans posteriorly. Opted to perform stand pivot transfer instead.   Ambulation/Gait                Stairs             Wheelchair Mobility    Modified Rankin (Stroke Patients Only)       Balance Overall balance assessment: Needs assistance Sitting-balance support: Bilateral upper extremity supported Sitting balance-Leahy Scale: Fair   Postural control: Left lateral lean Standing balance support: Bilateral upper extremity supported Standing balance-Leahy Scale: Poor Standing balance comment: RLE remains flexed, unable to weightbear sufficiently to take any steps to the L.                              Pertinent Vitals/Pain Pain Assessment: Faces Faces Pain Scale: Hurts little more Pain Location: R hip Pain Descriptors / Indicators: Aching Pain Intervention(s): Limited activity within patient's tolerance;Monitored during session;Repositioned    Home Living Family/patient expects to be discharged to:: Skilled nursing facility Living Arrangements: Alone Available Help at Discharge: Available 24 hours/day;Personal care attendant   Home Access: Ramped entrance     Home Layout:  (has 1-2 steps t/o the house )   Additional Comments: 1-2 steps to enter the house     Prior Function Level of Independence: Needs assistance   Gait / Transfers Assistance Needed: Apparently pt can typically walk w/o AD, but caregivers are always with her     Comments: Pt does go out weekly, had been doing outpt PT and getting out more     Hand Dominance        Extremity/Trunk Assessment  Upper Extremity Assessment Upper Extremity Assessment: Generalized weakness    Lower Extremity Assessment Lower Extremity Assessment: Generalized weakness       Communication   Communication:  (pt with mild dementia, able to communicate)  Cognition Arousal/Alertness: Awake/alert Behavior During Therapy: WFL for tasks assessed/performed Overall Cognitive Status: History of cognitive impairments - at baseline                                        General Comments General  comments (skin integrity, edema, etc.): R hip honeycomb dressing is clear and no drainage.     Exercises Total Joint Exercises Ankle Circles/Pumps: AROM;Both;10 reps Heel Slides: AROM;Both;10 reps;AAROM;Right Hip ABduction/ADduction: AROM;Left;AAROM;Right;10 reps Straight Leg Raises: AAROM;Right;Left;10 reps Long Arc Quad: AROM;Both;10 reps   Assessment/Plan    PT Assessment Patient needs continued PT services  PT Problem List Decreased strength;Decreased range of motion;Decreased activity tolerance;Decreased balance;Decreased mobility;Decreased knowledge of use of DME;Decreased safety awareness;Pain       PT Treatment Interventions DME instruction;Gait training;Stair training;Therapeutic activities;Functional mobility training;Balance training;Therapeutic exercise;Patient/family education    PT Goals (Current goals can be found in the Care Plan section)  Acute Rehab PT Goals Patient Stated Goal: get stronger PT Goal Formulation: With patient Time For Goal Achievement: 05/02/17 Potential to Achieve Goals: Fair    Frequency BID   Barriers to discharge        Co-evaluation               AM-PAC PT "6 Clicks" Daily Activity  Outcome Measure Difficulty turning over in bed (including adjusting bedclothes, sheets and blankets)?: A Lot Difficulty moving from lying on back to sitting on the side of the bed? : A Lot Difficulty sitting down on and standing up from a chair with arms (e.g., wheelchair, bedside commode, etc,.)?: A Lot Help needed moving to and from a bed to chair (including a wheelchair)?: Total Help needed walking in hospital room?: Total Help needed climbing 3-5 steps with a railing? : Total 6 Click Score: 9    End of Session Equipment Utilized During Treatment: Gait belt Activity Tolerance: Patient tolerated treatment well;Patient limited by fatigue Patient left: in chair;with chair alarm set;with call bell/phone within reach Nurse Communication: Mobility  status PT Visit Diagnosis: Muscle weakness (generalized) (M62.81);Difficulty in walking, not elsewhere classified (R26.2)    Time: 7371-0626 PT Time Calculation (min) (ACUTE ONLY): 30 min   Charges:   PT Evaluation $PT Re-evaluation: 1 Re-eval PT Treatments $Therapeutic Exercise: 8-22 mins   PT G Codes:   PT G-Codes **NOT FOR INPATIENT CLASS** Functional Assessment Tool Used: AM-PAC 6 Clicks Basic Mobility Functional Limitation: Mobility: Walking and moving around Mobility: Walking and Moving Around Current Status (R4854): At least 80 percent but less than 100 percent impaired, limited or restricted Mobility: Walking and Moving Around Goal Status [phone]): At least 60 percent but less than 80 percent impaired, limited or restricted   Alva Garnet PT, DPT, CSCS    04/18/2017, 1:13 PM

## 2017-04-18 NOTE — Progress Notes (Signed)
1        [facility]   PATIENT NAME: Kelly Wright    MR#:  [id number]  DATE OF BIRTH:  06-May-1933  SUBJECTIVE:  CHIEF COMPLAINT:   Chief Complaint  Patient presents with  . Fall  Hb 9.9, Patient has baseline dementia and confused, Working with physical therapy today REVIEW OF SYSTEMS:  Review of Systems  Unable to perform ROS: Dementia  Constitutional: Negative for chills, fever, malaise/fatigue and weight loss.  HENT: Negative for nosebleeds and sore throat.   Eyes: Negative for blurred vision.  Respiratory: Negative for cough, shortness of breath and wheezing.   Cardiovascular: Negative for chest pain, orthopnea, leg swelling and PND.  Gastrointestinal: Negative for abdominal pain, constipation, diarrhea, heartburn, nausea and vomiting.  Genitourinary: Negative for dysuria and urgency.  Musculoskeletal: Positive for joint pain. Negative for back pain.  Skin: Negative for rash.  Neurological: Positive for weakness. Negative for dizziness, speech change, focal weakness and headaches.  Endo/Heme/Allergies: Does not bruise/bleed easily.  Psychiatric/Behavioral: Negative for depression.   DRUG ALLERGIES:  No Known Allergies VITALS:  Blood pressure 116/74, pulse 81, temperature 97.8 F (36.6 C), temperature source Oral, resp. rate 18, height 5\' 9"  (1.753 m), weight 59.9 kg (132 lb), SpO2 98 %. PHYSICAL EXAMINATION:  Physical Exam  Constitutional: She is well-developed, well-nourished, and in no distress.  HENT:  Head: Normocephalic and atraumatic.  Eyes: Pupils are equal, round, and reactive to light. Conjunctivae and EOM are normal.  Neck: Normal range of motion. Neck supple. No tracheal deviation present. No thyromegaly present.  Cardiovascular: Normal heart sounds.  An irregularly irregular rhythm present. Tachycardia present.   Pulmonary/Chest: Effort normal and breath sounds normal. No respiratory distress. She has no wheezes. She  exhibits no tenderness.  Abdominal: Soft. Bowel sounds are normal. She exhibits no distension. There is no tenderness.  Musculoskeletal:       Right hip: She exhibits normal range of motion, normal strength, no tenderness and no bony tenderness.  Surgical site dressing looks clean  Neurological: She is alert. No cranial nerve deficit.  confused  Skin: Skin is warm and dry. No rash noted.  Psychiatric: Mood and affect normal.   LABORATORY PANEL:  Female CBC  Recent Labs Lab 04/17/17 0217  04/18/17 0159  WBC 7.0  --   --   HGB 9.4*  < > 9.0*  HCT 27.4*  < > 25.8*  PLT 154  --   --   < > = values in this interval not displayed. ------------------------------------------------------------------------------------------------------------------ Chemistries   Recent Labs Lab 04/15/17 0155  04/18/17 0159  NA 140  --  138  K 3.5  < > 3.0*  CL 111  --  108  CO2 24  --  24  GLUCOSE 125*  --  105*  BUN 12  --  10  CREATININE 0.75  --  0.42*  CALCIUM 7.5*  < > 7.4*  MG  --   < > 1.8  AST 23  --   --   ALT 7*  --   --   ALKPHOS 40  --   --   BILITOT 1.0  --   --   < > = values in this interval not displayed. RADIOLOGY:  No results found. ASSESSMENT AND PLAN:  83 y.o. female with a history of chronic atrial fibrillation, a history of DVT, gastroesophageal reflux disease, and a CVA who lives independently. Apparently she lost her balance descending several  stairs, causing her to land on her right hip. She was brought to the emergency room where x-rays demonstrated a displaced 4 part fracture of the intertrochanteric region of her right hip  *Paroxysmal A. fib - HR Is better at 95-100 today -Cardiology Recommended to continue amiodarone 400 mg twice a day to better control rate, closely monitor heart rate - not a good candidate for anticoagulation -Follow up with Dr. Welton Flakes  *Anemia/pancytopenia- acute on chronic blood loss -Hold her Lovenox as platelet counts dropped from  130->87->78 - HIT panel negative. stop Lovenox - Ibuprofen  -Likely from blood loss while at surgery - Hemoglobin dropped s/p one PRBC. hemoglobin 9.7-9.9 today - has h/o DVT, A.fib and CVA - high risk for blood clots -SCDs for now  *Right hip fracture -Reduction and internal fixation of displaced intertrochanteric right hip fracture on 10/1 -Physical therapy consult pending - pain control  * Hypokalemia - replete and recheck as needed  * Acute kidney injury -Resolved with hydration   . Rheumatoid arthritis (HCC) -Stable  . Moderate Dementia -Stable     Overall poor prognosis  - c/s palliative care   Physical therapy recommends short-term rehab/skilled nursing facility, anticipate discharge tomorrow   All the records are reviewed and case discussed with Care Management/Social Worker. Management plans discussed with the patient, son-in-law at bedside they are in agreement.  CODE STATUS: Full Code  TOTAL TIME TAKING CARE OF THIS PATIENT: 35 minutes.   More than 50% of the time was spent in counseling/coordination of care: YES  POSSIBLE D/C IN 3-4 DAYS, DEPENDING ON CLINICAL CONDITION    Ramonita Lab M.D on 04/18/2017 at 10:17 AM  Between 7am to 6pm - Pager - [phone]   After 6pm go to [email or web address] - Social research officer, government  [facility]  Office  [phone]  CC: Primary care physician; Leanna Sato, MD  Note: This dictation was prepared with Dragon dictation along with smaller phrase technology. Any transcriptional errors that result from this process are unintentional.

## 2017-04-18 NOTE — Progress Notes (Signed)
Pharmacy Consult for Electrolytes Indication: Hypokalemia, atrial fibrillation   No Known Allergies  Patient Measurements: Height: 5\' 9"  (175.3 cm) Weight: 132 lb (59.9 kg) IBW/kg (Calculated) : 66.2   Vital Signs: Temp: 97.8 F (36.6 C) (10/07 0522) Temp Source: Oral (10/07 0522) BP: 116/74 (10/07 0522) Pulse Rate: 81 (10/07 0522) Intake/Output from previous day: No intake/output data recorded. Intake/Output from this shift: No intake/output data recorded.  Labs:  Recent Labs  04/16/17 0207  04/17/17 0217 04/17/17 1002 04/17/17 1746 04/18/17 0159  WBC  --   --  7.0  --   --   --   HGB 10.0*  < > 9.4* 9.9* 9.9* 9.0*  HCT 29.1*  < > 27.4* 29.1* 28.3* 25.8*  PLT  --   --  154  --   --   --   CREATININE  --   --   --   --   --  0.42*  MG 1.9  --  2.0  --   --  1.8  PHOS  --   --  1.4*  --   --  3.3  < > = values in this interval not displayed. Estimated Creatinine Clearance: 51.3 mL/min (A) (by C-G formula based on SCr of 0.42 mg/dL (L)).  Sodium (mmol/L)  Date Value  04/18/2017 138  04/03/2013 141   Potassium (mmol/L)  Date Value  04/18/2017 3.0 (L)  04/03/2013 3.8   Calcium (mg/dL)  Date Value  04/05/2013 7.4 (L)   Calcium, Total (mg/dL)  Date Value  79/39/0300 8.8   Albumin (g/dL)  Date Value  92/33/0076 2.6 (L)  04/03/2013 3.4     Medical History: Past Medical History:  Diagnosis Date  . Arthritis   . Atrial fibrillation (HCC)   . Cerebral vascular accident (HCC)   . DVT (deep venous thrombosis) (HCC)   . Dysrhythmia   . GERD (gastroesophageal reflux disease)   . Interstitial lung disease (HCC)   . Osteoporosis    Assessment: 83 y/o F with a h/o CAF admitted after fall. Pharmacy consulted for electrolyte monitoring.  Plan:  KCl 60 meq po once and magnesium sulfate 2 g iv once. Will recheck K at 1800.   97 D 04/18/2017,8:41 AM

## 2017-04-18 NOTE — Progress Notes (Signed)
Pharmacy Consult for Electrolytes Indication: Hypokalemia, atrial fibrillation   No Known Allergies  Patient Measurements: Height: 5\' 9"  (175.3 cm) Weight: 132 lb (59.9 kg) IBW/kg (Calculated) : 66.2   Vital Signs: Temp: 97.6 F (36.4 C) (10/07 1034) Temp Source: Oral (10/07 1034) BP: 130/70 (10/07 1034) Pulse Rate: 107 (10/07 1034) Intake/Output from previous day: No intake/output data recorded. Intake/Output from this shift: No intake/output data recorded.  Labs:  Recent Labs  04/16/17 0207  04/17/17 0217 04/17/17 1002 04/17/17 1746 04/18/17 0159  WBC  --   --  7.0  --   --   --   HGB 10.0*  < > 9.4* 9.9* 9.9* 9.0*  HCT 29.1*  < > 27.4* 29.1* 28.3* 25.8*  PLT  --   --  154  --   --   --   CREATININE  --   --   --   --   --  0.42*  MG 1.9  --  2.0  --   --  1.8  PHOS  --   --  1.4*  --   --  3.3  < > = values in this interval not displayed. Estimated Creatinine Clearance: 51.3 mL/min (A) (by C-G formula based on SCr of 0.42 mg/dL (L)).  Sodium (mmol/L)  Date Value  04/18/2017 138  04/03/2013 141   Potassium (mmol/L)  Date Value  04/18/2017 3.2 (L)  04/03/2013 3.8   Calcium (mg/dL)  Date Value  04/05/2013 7.4 (L)   Calcium, Total (mg/dL)  Date Value  16/04/9603 8.8   Albumin (g/dL)  Date Value  54/03/8118 2.6 (L)  04/03/2013 3.4     Medical History: Past Medical History:  Diagnosis Date  . Arthritis   . Atrial fibrillation (HCC)   . Cerebral vascular accident (HCC)   . DVT (deep venous thrombosis) (HCC)   . Dysrhythmia   . GERD (gastroesophageal reflux disease)   . Interstitial lung disease (HCC)   . Osteoporosis    Assessment: 83 y/o F with a h/o CAF admitted after fall. Pharmacy consulted for electrolyte monitoring.  Plan:  KCl 60 meq po once and magnesium sulfate 2 g iv once. Will recheck K at 1800.   10/7 1817 K 3.2. Will give potassium chloride 40 mEq po x 1 and recheck with AM labs,  12/7, Pharm.D., BCPS Clinical  Pharmacist 04/18/2017,7:04 PM

## 2017-04-19 LAB — CBC
HCT: 25.9 % — ABNORMAL LOW (ref 35.0–47.0)
Hemoglobin: 8.9 g/dL — ABNORMAL LOW (ref 12.0–16.0)
MCH: 33.2 pg (ref 26.0–34.0)
MCHC: 34.3 g/dL (ref 32.0–36.0)
MCV: 96.7 fL (ref 80.0–100.0)
Platelets: 180 10*3/uL (ref 150–440)
RBC: 2.68 MIL/uL — AB (ref 3.80–5.20)
RDW: 17.7 % — AB (ref 11.5–14.5)
WBC: 6.1 10*3/uL (ref 3.6–11.0)

## 2017-04-19 LAB — URINALYSIS, COMPLETE (UACMP) WITH MICROSCOPIC
Bilirubin Urine: NEGATIVE
Glucose, UA: NEGATIVE mg/dL
Hgb urine dipstick: NEGATIVE
Ketones, ur: NEGATIVE mg/dL
Nitrite: NEGATIVE
Protein, ur: NEGATIVE mg/dL
Specific Gravity, Urine: 1.018 (ref 1.005–1.030)
pH: 6 (ref 5.0–8.0)

## 2017-04-19 LAB — BASIC METABOLIC PANEL
ANION GAP: 3 — AB (ref 5–15)
BUN: 10 mg/dL (ref 6–20)
CALCIUM: 7.6 mg/dL — AB (ref 8.9–10.3)
CHLORIDE: 112 mmol/L — AB (ref 101–111)
CO2: 24 mmol/L (ref 22–32)
Creatinine, Ser: 0.43 mg/dL — ABNORMAL LOW (ref 0.44–1.00)
GFR calc non Af Amer: >60 mL/min (ref >60)
Glucose, Bld: 109 mg/dL — ABNORMAL HIGH (ref 65–99)
Potassium: 3.7 mmol/L (ref 3.5–5.1)
SODIUM: 139 mmol/L (ref 135–145)

## 2017-04-19 LAB — MAGNESIUM: MAGNESIUM: 2.1 mg/dL (ref 1.7–2.4)

## 2017-04-19 MED ORDER — METOPROLOL SUCCINATE ER 25 MG PO TB24: 25.0000 mg | ORAL_TABLET | Freq: Every day | ORAL | Status: AC

## 2017-04-19 MED ORDER — CYCLOBENZAPRINE HCL 5 MG PO TABS: 5.0000 mg | ORAL_TABLET | Freq: Three times a day (TID) | ORAL | 0 refills | Status: AC | PRN

## 2017-04-19 MED ORDER — PANTOPRAZOLE SODIUM 40 MG PO TBEC: 40.0000 mg | DELAYED_RELEASE_TABLET | Freq: Every day | ORAL | Status: AC

## 2017-04-19 MED ORDER — PIPERACILLIN-TAZOBACTAM 3.375 G IVPB
3.3750 g | Freq: Three times a day (TID) | INTRAVENOUS | Status: DC
  Administered 2017-04-19 (×2): 3.375 g via INTRAVENOUS
  Filled 2017-04-19 (×2): qty 50

## 2017-04-19 MED ORDER — CEPHALEXIN 500 MG PO CAPS: 500.0000 mg | ORAL_CAPSULE | Freq: Two times a day (BID) | ORAL | 0 refills | Status: AC

## 2017-04-19 MED ORDER — SODIUM CHLORIDE 0.9% FLUSH: 3.0000 mL | Freq: Two times a day (BID) | INTRAVENOUS | Status: DC

## 2017-04-19 MED ORDER — AMIODARONE HCL 400 MG PO TABS: 400.0000 mg | ORAL_TABLET | Freq: Two times a day (BID) | ORAL | Status: DC

## 2017-04-19 MED ORDER — METOPROLOL SUCCINATE ER 25 MG PO TB24
25.0000 mg | ORAL_TABLET | Freq: Every day | ORAL | Status: DC
  Administered 2017-04-19: 25 mg via ORAL
  Filled 2017-04-19: qty 1

## 2017-04-19 MED ORDER — DIPHENHYDRAMINE HCL 12.5 MG/5ML PO ELIX: 12.5000 mg | ORAL_SOLUTION | ORAL | 0 refills | Status: DC | PRN

## 2017-04-19 MED ORDER — ASPIRIN EC 81 MG PO TBEC: 81.0000 mg | DELAYED_RELEASE_TABLET | Freq: Two times a day (BID) | ORAL | 0 refills | Status: AC

## 2017-04-19 MED ORDER — HYDROCODONE-ACETAMINOPHEN 5-325 MG PO TABS: 1.0000 | ORAL_TABLET | Freq: Four times a day (QID) | ORAL | 0 refills | Status: DC | PRN

## 2017-04-19 MED ORDER — ACETAMINOPHEN 500 MG PO TABS: 500.0000 mg | ORAL_TABLET | Freq: Four times a day (QID) | ORAL | 0 refills | Status: AC

## 2017-04-19 NOTE — Plan of Care (Signed)
Problem: Health Behavior/Discharge Planning: Goal: Ability to manage health-related needs will improve Outcome: Adequate for Discharge Discharge to [facility].  Care provided by staff.

## 2017-04-19 NOTE — Progress Notes (Signed)
Daily Progress Note   Patient Name: Kelly Wright       Date: 04/19/2017 DOB: 03-Sep-1932  Age: 83 y.o. MRN#: [id number] Attending Physician: Ramonita Lab, MD Primary Care Physician: Leanna Sato, MD Admit Date: 04/11/2017  Reason for Consultation/Follow-up: Establishing goals of care  Subjective: Kelly Wright is resting in bed, family at bedside. She denies pain or other complaints. She is confused per her baseline, and has a history of dementia.     Length of Stay: 8  Current Medications: Scheduled Meds:  . acetaminophen  500 mg Oral Q6H  . amiodarone  400 mg Oral BID  . citalopram  30 mg Oral QHS  . donepezil  10 mg Oral QHS  . folic acid  500 mcg Oral QHS  . loratadine  10 mg Oral Daily  . memantine  10 mg Oral BID  . methotrexate  12.5 mg Oral Q Wed  . metoprolol succinate  25 mg Oral Daily  . pantoprazole  40 mg Oral BID  . sodium chloride flush  3 mL Intravenous Q12H    Continuous Infusions: . sodium chloride 100 mL/hr at 04/19/17 1004  .  ceFAZolin (ANCEF) IV    . piperacillin-tazobactam (ZOSYN)  IV 3.375 g (04/19/17 1431)    PRN Meds: bisacodyl, cyclobenzaprine, diphenhydrAMINE, HYDROcodone-acetaminophen, magnesium hydroxide, metoCLOPramide **OR** metoCLOPramide (REGLAN) injection, metoprolol tartrate, ondansetron **OR** [DISCONTINUED] ondansetron (ZOFRAN) IV, sodium phosphate  Physical Exam  Constitutional: No distress.  HENT:  Head: Normocephalic.  Neurological:  Confused per her baseline dementia.            Vital Signs: BP 125/65   Pulse 94   Temp 98.3 F (36.8 C) (Oral)   Resp 18   Ht 5\' 9"  (1.753 m)   Wt 60.7 kg (133 lb 14.4 oz)   SpO2 98%   BMI 19.77 kg/m  SpO2: SpO2: 98 % O2 Device: O2 Device: Not Delivered O2 Flow Rate: O2 Flow Rate (L/min): 8  L/min  Intake/output summary:  Intake/Output Summary (Last 24 hours) at 04/19/17 1503 Last data filed at 04/19/17 0218  Gross per 24 hour  Intake                0 ml  Output                0 ml  Net                0 ml   LBM: Last BM Date: 04/18/17 Baseline Weight: Weight: 54.4 kg (120 lb) Most recent weight: Weight: 60.7 kg (133 lb 14.4 oz)       Palliative Assessment/Data: 40%      Patient Active Problem List   Diagnosis Date Noted  . Closed displaced intertrochanteric fracture of right femur (HCC) 04/11/2017  . Infection of urinary tract 02/21/2016  . Dementia 02/21/2016  . Generalized weakness 02/21/2016  . Slurred speech 02/20/2016  . Depression 02/20/2016  . Broken ribs 08/11/2015  . UTI (lower urinary tract infection) 11/12/2014  . Encephalopathy, metabolic 11/12/2014  . Rheumatoid arthritis (HCC) 11/12/2014  . Chronic anticoagulation 11/12/2014  . Atrial fibrillation (HCC) 11/12/2014  . H/O: CVA (cerebrovascular accident) 11/12/2014    Palliative Care Assessment & Plan   Patient Profile: 83 y.o.femalewho is retired from [employer] with a history of  atrial fibrillation, a history of DVT, gastroesophageal reflux disease, and a CVA who lives at home with around the clock care provider.    Assessment:  Kelly Wright is s/p IMN of right hip. Cardiology has been following for her A fib with RVR and adjusting medications. She has worked with PT with recommendations for SNF placement.     Recommendations/Plan:  Continue with current treatment plans.   Goals of Care and Additional Recommendations:  Limitations on Scope of Treatment: Full Scope Treatment  Code Status:    Code Status Orders        Start     Ordered   04/12/17 0018  Full code  Continuous     04/12/17 0017    Code Status History    Date Active Date Inactive Code Status Order ID Comments User Context   02/20/2016  9:41 PM 02/21/2016  8:42 AM Full Code [id number]  Oralia Manis, MD  Inpatient   08/11/2015  1:14 AM 08/12/2015  9:25 PM Full Code [id number]  Arnaldo Natal, MD ED   11/12/2014  2:35 AM 11/16/2014  9:54 PM Full Code [id number]  Vernell Barrier, MD Inpatient    Advance Directive Documentation     Most Recent Value  Type of Advance Directive  Living will  Pre-existing out of facility DNR order (yellow form or pink MOST form)  -  "MOST" Form in Place?  -       Prognosis:  Unable to determine  This depends on her healing and progress with PT as well as her cardiac status.   Discharge Planning:  To Be Determined   Thank you for allowing the Palliative Medicine Team to assist in the care of this patient.   Total Time 45 minutes Prolonged Time Billed No      Greater than 50%  of this time was spent counseling and coordinating care related to the above assessment and plan.  Morton Stall, NP 04/19/2017 3:11 PM Office: [phone] 7am-7pm  Pager: [phone] Call primary team after hours  Please contact Palliative Medicine Team phone at [phone] for questions and concerns.

## 2017-04-19 NOTE — Clinical Social Work Placement (Signed)
   CLINICAL SOCIAL WORK PLACEMENT  NOTE  Date:  04/19/2017  Patient Details  Name: Kelly Wright MRN: [id number] Date of Birth: 12-06-1932  Clinical Social Work is seeking post-discharge placement for this patient at the Skilled  Nursing Facility level of care (*CSW will initial, date and re-position this form in  chart as items are completed):  Yes   Patient/family provided with [place] Clinical Social Work Department's list of facilities offering this level of care within the geographic area requested by the patient (or if unable, by the patient's family).  Yes   Patient/family informed of their freedom to choose among providers that offer the needed level of care, that participate in Medicare, Medicaid or managed care program needed by the patient, have an available bed and are willing to accept the patient.  Yes   Patient/family informed of [facility]'s ownership interest in [facility] and [facility], as well as of the fact that they are under no obligation to receive care at these facilities.  PASRR submitted to EDS on 04/12/17     PASRR number received on 04/12/17     Existing PASRR number confirmed on       FL2 transmitted to all facilities in geographic area requested by pt/family on 04/12/17     FL2 transmitted to all facilities within larger geographic area on       Patient informed that his/her managed care company has contracts with or will negotiate with certain facilities, including the following:        Yes   Patient/family informed of bed offers received.  Patient chooses bed at [facility]     Physician recommends and patient chooses bed at      Patient to be transferred to [facility] on 04/19/17.  Patient to be transferred to facility by [facility]     Patient family notified on 04/19/17 of transfer.  Name of family member notified:  Son Marcial Pacas Suen     PHYSICIAN Please sign FL2     Additional  Comment:    _______________________________________________ Darleene Cleaver, LCSWA 04/19/2017, 4:18 PM

## 2017-04-19 NOTE — Clinical Social Work Note (Signed)
Patient to be d/c'ed today to [facility].  Patient and family agreeable to plans will transport via ems RN to call report.  Windell Moulding, MSW, Theresia Majors [phone]

## 2017-04-19 NOTE — Progress Notes (Signed)
Pharmacy Consult for Electrolytes Indication: Hypokalemia, atrial fibrillation   No Known Allergies  Patient Measurements: Height: 5\' 9"  (175.3 cm) Weight: 133 lb 14.4 oz (60.7 kg) IBW/kg (Calculated) : 66.2   Vital Signs: Temp: 98.3 F (36.8 C) (10/08 1003) Temp Source: Oral (10/08 1003) BP: 125/65 (10/08 1003) Pulse Rate: 94 (10/08 1003) Intake/Output from previous day: 10/07 0701 - 10/08 0700 In: 120 [P.O.:120] Out: 0  Intake/Output from this shift: No intake/output data recorded.  Labs:  Recent Labs  04/17/17 0217  04/17/17 1746 04/18/17 0159 04/19/17 0617  WBC 7.0  --   --   --  6.1  HGB 9.4*  < > 9.9* 9.0* 8.9*  HCT 27.4*  < > 28.3* 25.8* 25.9*  PLT 154  --   --   --  180  CREATININE  --   --   --  0.42* 0.43*  MG 2.0  --   --  1.8 2.1  PHOS 1.4*  --   --  3.3  --   < > = values in this interval not displayed. Estimated Creatinine Clearance: 52 mL/min (A) (by C-G formula based on SCr of 0.43 mg/dL (L)).  Sodium (mmol/L)  Date Value  04/19/2017 139  04/03/2013 141   Potassium (mmol/L)  Date Value  04/19/2017 3.7  04/03/2013 3.8   Calcium (mg/dL)  Date Value  04/05/2013 7.6 (L)   Calcium, Total (mg/dL)  Date Value  50/03/3817 8.8   Albumin (g/dL)  Date Value  29/93/7169 2.6 (L)  04/03/2013 3.4     Medical History: Past Medical History:  Diagnosis Date  . Arthritis   . Atrial fibrillation (HCC)   . Cerebral vascular accident (HCC)   . DVT (deep venous thrombosis) (HCC)   . Dysrhythmia   . GERD (gastroesophageal reflux disease)   . Interstitial lung disease (HCC)   . Osteoporosis    Assessment: 83 y/o F with a h/o CAF admitted after fall. Pharmacy consulted for electrolyte monitoring.  Plan:  KCl 60 meq po once and magnesium sulfate 2 g iv once. Will recheck K at 1800.   10/7 1817 K 3.2. Will give potassium chloride 40 mEq po x 1 and recheck with AM labs  10/8 0617 K 3.7, Mg 2.1. WNL; Recheck with AM labs tomorrow. Will continue  to monitor and manage.   12/8 Pharmacy Resident  04/19/2017,11:46 AM

## 2017-04-19 NOTE — Discharge Summary (Signed)
[facility] -  at [facility]   PATIENT NAME: Kelly Wright    MR#:  [id number]  DATE OF BIRTH:  02-26-1933  DATE OF ADMISSION:  04/11/2017 ADMITTING PHYSICIAN: Gery Pray, MD  DATE OF DISCHARGE: 04/19/17  PRIMARY CARE PHYSICIAN: Leanna Sato, MD    ADMISSION DIAGNOSIS:  Closed displaced intertrochanteric fracture of right femur, initial encounter (HCC) [S72.141A]  DISCHARGE DIAGNOSIS:  Active Problems:   Rheumatoid arthritis (HCC)   Atrial fibrillation (HCC)   Dementia   Closed displaced intertrochanteric fracture of right femur (HCC)   SECONDARY DIAGNOSIS:   Past Medical History:  Diagnosis Date  . Arthritis   . Atrial fibrillation (HCC)   . Cerebral vascular accident (HCC)   . DVT (deep venous thrombosis) (HCC)   . Dysrhythmia   . GERD (gastroesophageal reflux disease)   . Interstitial lung disease (HCC)   . Osteoporosis     HOSPITAL COURSE:   HPI: This is a 83 year old female with moderate dementia and lives alone. She is around the clock caretakers. Today she went outside and tripped and fell going down a ramp. It was a mechanical fall. She did hit her head. Her caretaker was unable to move her, 911 was called and she was brought to the ER. History provided by the patient's daughters and caretaker present at bedside. Point of contact is Tim at [phone] or Christy at [phone]. The patient is in relatively good health. No significant history of coronary artery disease. No recent CHF. No complaint of chest pains per report  *Paroxysmal A. fib - HR Is better -Cardiology Recommended to continue amiodarone 400 mg twice a day to better control rate, closely monitor heart rate - not a good candidate for anticoagulation - Continue Toprol-XL 25 mg -Follow up with Dr. Welton Flakes  *Anemia/pancytopenia- acute on chronic blood loss -Hold her Lovenox as platelet counts dropped from 130->87->78 - HIT panel negative. stop Lovenox -  Ibuprofen  -Likely from blood loss while at surgery - Hemoglobin dropped s/p one PRBC. hemoglobin 9.7-9.9 today - has h/o DVT, A.fib and CVA - high risk for blood clots -SCDs for now  *Right hip fracture -Reduction and internal fixation of displaced intertrochanteric righthip fracture on 10/1 -Physical therapy consulted- recommending SNF - pain control PRN  -ORTHO has recommended aspirin 81 mg twice a day for DVT prophylaxis for 2 weeks  *Abnormal urinalysis Patient is afebrile and no leukocytosis. Given IV Zosyn Urine culture sent out today. Primary care physician at the facility has to follow up on the final culture results of urine Will discharge patient with by mouth KEFLEX  * Hypokalemia - replete and recheck as needed  * Acute kidney injury -Resolved with hydration   . Rheumatoid arthritis (HCC) -Stable  . Moderate Dementia -Stable; discontinue Namenda per family request as it would cause dizziness to patient   Discussed with son at bedside agreeable to discharge patient to [facility] today  DISCHARGE CONDITIONS:   stable  CONSULTS OBTAINED:  Treatment Team:  Poggi, Excell Seltzer, MD Jeralyn Ruths, MD Laurier Nancy, MD   PROCEDURES rt hip surgery  DRUG ALLERGIES:  No Known Allergies  DISCHARGE MEDICATIONS:   Current Discharge Medication List    START taking these medications   Details  acetaminophen (TYLENOL) 500 MG tablet Take 1 tablet (500 mg total) by mouth every 6 (six) hours. Qty: 30 tablet, Refills: 0    amiodarone (PACERONE) 400 MG tablet Take 1 tablet (400 mg  total) by mouth 2 (two) times daily.    cephALEXin (KEFLEX) 500 MG capsule Take 1 capsule (500 mg total) by mouth 2 (two) times daily. Qty: 14 capsule, Refills: 0    cyclobenzaprine (FLEXERIL) 5 MG tablet Take 1 tablet (5 mg total) by mouth 3 (three) times daily as needed for muscle spasms. Qty: 20 tablet, Refills: 0    diphenhydrAMINE (BENADRYL) 12.5 MG/5ML elixir Take  5-10 mLs (12.5-25 mg total) by mouth every 4 (four) hours as needed for itching. Qty: 120 mL, Refills: 0    HYDROcodone-acetaminophen (NORCO/VICODIN) 5-325 MG tablet Take 1 tablet by mouth every 6 (six) hours as needed for moderate pain or severe pain. Qty: 30 tablet, Refills: 0    metoprolol succinate (TOPROL-XL) 25 MG 24 hr tablet Take 1 tablet (25 mg total) by mouth daily.    pantoprazole (PROTONIX) 40 MG tablet Take 1 tablet (40 mg total) by mouth daily.      CONTINUE these medications which have CHANGED   Details  aspirin EC 81 MG tablet Take 1 tablet (81 mg total) by mouth 2 (two) times daily. Qty: 30 tablet, Refills: 0      CONTINUE these medications which have NOT CHANGED   Details  citalopram (CELEXA) 20 MG tablet Take 30 mg by mouth at bedtime.     CRANBERRY CONCENTRATE PO Take 500 mg by mouth daily.     folic acid (FOLVITE) 400 MCG tablet Take 400 mcg by mouth at bedtime.    methotrexate (RHEUMATREX) 2.5 MG tablet Take 12.5 mg by mouth every Wednesday.     donepezil (ARICEPT) 10 MG tablet Take 10 mg by mouth at bedtime.    loratadine (CLARITIN) 10 MG tablet Take 10 mg by mouth daily.      STOP taking these medications     ciprofloxacin (CIPRO) 250 MG tablet      memantine (NAMENDA) 10 MG tablet          DISCHARGE INSTRUCTIONS:   Staples to be removed on October 11 Follow-up with primary care physician in 3-5 days or sooner as needed Follow-up with orthopedics Dr. Salomon Mast in 3-4 weeks; postop wound care by orthopedics Follow-up with cardiology Dr. Welton Flakes in a week   DIET:  Cardiac diet  DISCHARGE CONDITION:  Fair  ACTIVITY:  Activity as tolerated per PT  OXYGEN:  Home Oxygen: No.   Oxygen Delivery: room air  DISCHARGE LOCATION:  nursing home   If you experience worsening of your admission symptoms, develop shortness of breath, life threatening emergency, suicidal or homicidal thoughts you must seek medical attention immediately by calling 911  or calling your MD immediately  if symptoms less severe.  You Must read complete instructions/literature along with all the possible adverse reactions/side effects for all the Medicines you take and that have been prescribed to you. Take any new Medicines after you have completely understood and accpet all the possible adverse reactions/side effects.   Please note  You were cared for by a hospitalist during your hospital stay. If you have any questions about your discharge medications or the care you received while you were in the hospital after you are discharged, you can call the unit and asked to speak with the hospitalist on call if the hospitalist that took care of you is not available. Once you are discharged, your primary care physician will handle any further medical issues. Please note that NO REFILLS for any discharge medications will be authorized once you are discharged, as it is  imperative that you return to your primary care physician (or establish a relationship with a primary care physician if you do not have one) for your aftercare needs so that they can reassess your need for medications and monitor your lab values.     Today  Chief Complaint  Patient presents with  . Fall   Patient is feeling fine. Son at bedside  ROS: Unobtainable from baseline dementia    VITAL SIGNS:  Blood pressure 125/65, pulse 94, temperature 98.3 F (36.8 C), temperature source Oral, resp. rate 18, height 5\' 9"  (1.753 m), weight 60.7 kg (133 lb 14.4 oz), SpO2 98 %.  I/O:    Intake/Output Summary (Last 24 hours) at 04/19/17 1552 Last data filed at 04/19/17 0218  Gross per 24 hour  Intake                0 ml  Output                0 ml  Net                0 ml    PHYSICAL EXAMINATION:  GENERAL:  83 y.o.-year-old patient lying in the bed with no acute distress.  EYES: Pupils equal, round, reactive to light and accommodation. No scleral icterus. Extraocular muscles intact.  HEENT: Head  atraumatic, normocephalic. Oropharynx and nasopharynx clear.  NECK:  Supple, no jugular venous distention. No thyroid enlargement, no tenderness.  LUNGS: Normal breath sounds bilaterally, no wheezing, rales,rhonchi or crepitation. No use of accessory muscles of respiration.  CARDIOVASCULAR: S1, S2 normal. No murmurs, rubs, or gallops.  ABDOMEN: Soft, non-tender, non-distended. Bowel sounds present. No organomegaly or mass.  EXTREMITIES: Right hip surgery site with honeycomb dressing  ;No pedal edema, cyanosis, or clubbing.  NEUROLOGIC: Patient is awake and alert and oriented 1 PSYCHIATRIC: The patient is alert and oriented x 1.  SKIN: No obvious rash, lesion, or ulcer.   DATA REVIEW:   CBC  Recent Labs Lab 04/19/17 0617  WBC 6.1  HGB 8.9*  HCT 25.9*  PLT 180    Chemistries   Recent Labs Lab 04/15/17 0155  04/19/17 0617  NA 140  < > 139  K 3.5  < > 3.7  CL 111  < > 112*  CO2 24  < > 24  GLUCOSE 125*  < > 109*  BUN 12  < > 10  CREATININE 0.75  < > 0.43*  CALCIUM 7.5*  < > 7.6*  MG  --   < > 2.1  AST 23  --   --   ALT 7*  --   --   ALKPHOS 40  --   --   BILITOT 1.0  --   --   < > = values in this interval not displayed.  Cardiac Enzymes No results for input(s): TROPONINI in the last 168 hours.  Microbiology Results  Results for orders placed or performed during the hospital encounter of 04/11/17  MRSA PCR Screening     Status: Abnormal   Collection Time: 04/12/17 12:10 AM  Result Value Ref Range Status   MRSA by PCR POSITIVE (A) NEGATIVE Final    Comment:        The GeneXpert MRSA Assay (FDA approved for NASAL specimens only), is one component of a comprehensive MRSA colonization surveillance program. It is not intended to diagnose MRSA infection nor to guide or monitor treatment for MRSA infections. RESULT CALLED TO, READ BACK BY AND VERIFIED WITH: ANESSA  MACROHON AT 9518 04/12/17.PMH     RADIOLOGY:  No results found.  EKG:   Orders placed or  performed during the hospital encounter of 04/11/17  . EKG 12-Lead  . EKG 12-Lead  . EKG 12-Lead  . EKG 12-Lead  . EKG 12-Lead  . EKG 12-Lead      Management plans discussed with the patient, family and they are in agreement.  CODE STATUS:     Code Status Orders        Start     Ordered   04/12/17 0018  Full code  Continuous     04/12/17 0017    Code Status History    Date Active Date Inactive Code Status Order ID Comments User Context   02/20/2016  9:41 PM 02/21/2016  8:42 AM Full Code [id number]  Oralia Manis, MD Inpatient   08/11/2015  1:14 AM 08/12/2015  9:25 PM Full Code [id number]  Arnaldo Natal, MD ED   11/12/2014  2:35 AM 11/16/2014  9:54 PM Full Code [id number]  Vernell Barrier, MD Inpatient    Advance Directive Documentation     Most Recent Value  Type of Advance Directive  Living will  Pre-existing out of facility DNR order (yellow form or pink MOST form)  -  "MOST" Form in Place?  -      TOTAL TIME TAKING CARE OF THIS PATIENT: 45  minutes.   Note: This dictation was prepared with Dragon dictation along with smaller phrase technology. Any transcriptional errors that result from this process are unintentional.   @MEC @  on 04/19/2017 at 3:52 PM  Between 7am to 6pm - Pager - [phone]  After 6pm go to [email or web address] - password [email or web address] [facility]  [place] [facility]  Office  [phone]  CC: Primary care physician; [phone], MD

## 2017-04-19 NOTE — Progress Notes (Signed)
Patient has not urinated the whole night and just went heavy  with strong odor. New order for U/A and Zosyn IV from Dr. Sheryle Hail received. The RN mentioned to MD that I & O  will likely not yield any output since the  patient just urinated. Dr. Sheryle Hail indicated to go ahead and give the Zosyn stat. Patient rested well this shift without any incident.  Will implement order as received  and  continue to monitor.

## 2017-04-19 NOTE — Progress Notes (Signed)
Subjective: Patient awake and appropriately resposive.  No c/o's re- right hip or thigh.    Objective: Vital signs in last 24 hours: Temp:  [97.5 F (36.4 C)-97.7 F (36.5 C)] 97.7 F (36.5 C) (10/08 0500) Pulse Rate:  [98-107] 98 (10/08 0500) Resp:  [18] 18 (10/08 0500) BP: (130-134)/(57-70) 134/57 (10/08 0500) SpO2:  [99 %-100 %] 99 % (10/08 0500) Weight:  [60.7 kg (133 lb 14.4 oz)] 60.7 kg (133 lb 14.4 oz) (10/08 0500)  Intake/Output from previous day: 10/07 0701 - 10/08 0700 In: 120 [P.O.:120] Out: 0  Intake/Output this shift: No intake/output data recorded.   Recent Labs  04/17/17 0217 04/17/17 1002 04/17/17 1746 04/18/17 0159 04/19/17 0617  HGB 9.4* 9.9* 9.9* 9.0* 8.9*    Recent Labs  04/17/17 0217  04/18/17 0159 04/19/17 0617  WBC 7.0  --   --  6.1  RBC 2.94*  --   --  2.68*  HCT 27.4*  < > 25.8* 25.9*  PLT 154  --   --  180  < > = values in this interval not displayed.  Recent Labs  04/18/17 0159 04/18/17 1817 04/19/17 0617  NA 138  --  139  K 3.0* 3.2* 3.7  CL 108  --  112*  CO2 24  --  24  BUN 10  --  10  CREATININE 0.42*  --  0.43*  GLUCOSE 105*  --  109*  CALCIUM 7.4*  --  7.6*   No results for input(s): LABPT, INR in the last 72 hours.  Physical Exam: Wounds healing well, no signs of infection.  Neurovascularly intact to right lower extremity.  X-rays: No new x-rays.  Assessment: S/P IM nailing of unstable IT fracture right hip.  Plan: Continue/resume PT for gait/transfer/balance training WBAT right LE. Staples to be removed on or about 10/11 if patient still here, otherwise may be removed at rehab facility. F/U appt. with orthopedics, either Horris Latino, PA-C, or myself, in another month.   Excell Seltzer Poggi 04/19/2017, 8:05 AM

## 2017-04-19 NOTE — Progress Notes (Signed)
Patient is incontinent. Has very powerful odor to urine. Ordered UA but will treat empirically treat with Zosyn.

## 2017-04-19 NOTE — Progress Notes (Signed)
SUBJECTIVE: Patient is feeling much better with no chest pain or shortness of breath   Vitals:   04/18/17 0522 04/18/17 1034 04/18/17 1945 04/19/17 0500  BP: 116/74 130/70 131/63 (!) 134/57  Pulse: 81 (!) 107 100 98  Resp: 18 18 18 18   Temp: 97.8 F (36.6 C) 97.6 F (36.4 C) (!) 97.5 F (36.4 C) 97.7 F (36.5 C)  TempSrc: Oral Oral Oral Oral  SpO2: 98% 100% 99% 99%  Weight:    133 lb 14.4 oz (60.7 kg)  Height:        Intake/Output Summary (Last 24 hours) at 04/19/17 0851 Last data filed at 04/19/17 06/19/17  Gross per 24 hour  Intake              120 ml  Output                0 ml  Net              120 ml    LABS: Basic Metabolic Panel:  Recent Labs  2831 0217 04/18/17 0159 04/18/17 1817 04/19/17 0617  NA  --  138  --  139  K 3.6 3.0* 3.2* 3.7  CL  --  108  --  112*  CO2  --  24  --  24  GLUCOSE  --  105*  --  109*  BUN  --  10  --  10  CREATININE  --  0.42*  --  0.43*  CALCIUM  --  7.4*  --  7.6*  MG 2.0 1.8  --  2.1  PHOS 1.4* 3.3  --   --    Liver Function Tests: No results for input(s): AST, ALT, ALKPHOS, BILITOT, PROT, ALBUMIN in the last 72 hours. No results for input(s): LIPASE, AMYLASE in the last 72 hours. CBC:  Recent Labs  04/17/17 0217  04/18/17 0159 04/19/17 0617  WBC 7.0  --   --  6.1  NEUTROABS 5.6  --   --   --   HGB 9.4*  < > 9.0* 8.9*  HCT 27.4*  < > 25.8* 25.9*  MCV 93.5  --   --  96.7  PLT 154  --   --  180  < > = values in this interval not displayed. Cardiac Enzymes: No results for input(s): CKTOTAL, CKMB, CKMBINDEX, TROPONINI in the last 72 hours. BNP: Invalid input(s): POCBNP D-Dimer: No results for input(s): DDIMER in the last 72 hours. Hemoglobin A1C: No results for input(s): HGBA1C in the last 72 hours. Fasting Lipid Panel: No results for input(s): CHOL, HDL, LDLCALC, TRIG, CHOLHDL, LDLDIRECT in the last 72 hours. Thyroid Function Tests: No results for input(s): TSH, T4TOTAL, T3FREE, THYROIDAB in the last 72  hours.  Invalid input(s): FREET3 Anemia Panel: No results for input(s): VITAMINB12, FOLATE, FERRITIN, TIBC, IRON, RETICCTPCT in the last 72 hours.   PHYSICAL EXAM General: Well developed, well nourished, in no acute distress HEENT:  Normocephalic and atramatic Neck:  No JVD.  Lungs: Clear bilaterally to auscultation and percussion. Heart: HRRR . Normal S1 and S2 without gallops or murmurs.  Abdomen: Bowel sounds are positive, abdomen soft and non-tender  Msk:  Back normal, normal gait. Normal strength and tone for age. Extremities: No clubbing, cyanosis or edema.   Neuro: Alert and oriented X 3. Psych:  Good affect, responds appropriately  TELEMETRY:Sinus tachycardia  ASSESSMENT AND PLAN: Patient has sinus tachycardia and will continue amiodarone 400 by mouth twice a day as has paroxysmal  atrial fibrillation postoperatively. May add metoprolol will do it.  Active Problems:   Rheumatoid arthritis (HCC)   Atrial fibrillation (HCC)   Dementia   Closed displaced intertrochanteric fracture of right femur (HCC)    Adrian Blackwater A, MD, [facility] 04/19/2017 8:51 AM

## 2017-04-19 NOTE — Discharge Instructions (Signed)
Staples to be removed on October 11 Follow-up with primary care physician in 3-5 days or sooner as needed Follow-up with orthopedics Dr. Salomon Mast in 3-4 weeks; postop wound care by orthopedics Follow-up with cardiology Dr. Welton Flakes in a week

## 2017-04-19 NOTE — Progress Notes (Signed)
Discharged to [facility] via EMS.  Report called to nursing staff at [facility].  AVS included in packet of information.  A copy of AVS also reviewed with the family.

## 2017-04-19 NOTE — Progress Notes (Signed)
Physical Therapy Treatment Patient Details Name: Kelly Wright MRN: [id number] DOB: 1933-03-29 Today's Date: 04/19/2017    History of Present Illness 83 y/o female who lives at home with 24/7 caregivers.  She had a fall with R hip fx and underwent ORIF 10/1.  History includes Afib, CVA, dementia.    PT Comments    Initial attempt, pt notes she just started her breakfast. Second attempt, pt agreeable to PT. Pt lethargic with minimal conversation/response to questioning. Minimal pain by face scale in Right lower extremity that increases slightly and temporarily with heel slide exercises with assist. Pt requires increased tactile and verbal cueing throughout exercise session, but does tolerate some resistance on left for several exercises. Assistance provided as needed on the Right lower extremity. Pt offered out of bed to chair, but pt declines noting she is too tired. Plan to treat pt this afternoon with hopeful mobility out of bed. Continue to progress participation, strength and endurance to improve all functional mobility.   Follow Up Recommendations  SNF     Equipment Recommendations       Recommendations for Other Services       Precautions / Restrictions Precautions Precautions: Fall Restrictions Weight Bearing Restrictions: Yes RLE Weight Bearing: Weight bearing as tolerated    Mobility  Bed Mobility               General bed mobility comments: Not tested; pt declines due to fatigue/lethargy "maybe later"  Transfers                    Ambulation/Gait                 Stairs            Wheelchair Mobility    Modified Rankin (Stroke Patients Only)       Balance                                            Cognition Arousal/Alertness: Lethargic Behavior During Therapy: WFL for tasks assessed/performed Overall Cognitive Status: History of cognitive impairments - at baseline                                        Exercises General Exercises - Lower Extremity Ankle Circles/Pumps: AROM;Both;20 reps;Supine Quad Sets: Strengthening;Both;20 reps;Supine (difficulty; best response with therapist hands under knees) Gluteal Sets: Strengthening;Both;20 reps;Supine (increased verbal/tactile cueing) Short Arc Quad: AROM;AAROM;Both;20 reps;Supine (occasional AAROM on R) Heel Slides: AAROM;Right;20 reps;Supine (AROM with resisted ext on L) Hip ABduction/ADduction: AAROM;Right;20 reps;Supine (RROM on L) Straight Leg Raises: AAROM;Both;10 reps;Supine    General Comments        Pertinent Vitals/Pain Faces Pain Scale: Hurts little more Pain Location: R hip Pain Intervention(s): Limited activity within patient's tolerance;Monitored during session    Home Living                      Prior Function            PT Goals (current goals can now be found in the care plan section) Progress towards PT goals: Not progressing toward goals - comment    Frequency    BID      PT Plan Current plan remains appropriate    Co-evaluation  AM-PAC PT "6 Clicks" Daily Activity  Outcome Measure  Difficulty turning over in bed (including adjusting bedclothes, sheets and blankets)?: Unable Difficulty moving from lying on back to sitting on the side of the bed? : Unable Difficulty sitting down on and standing up from a chair with arms (e.g., wheelchair, bedside commode, etc,.)?: Unable Help needed moving to and from a bed to chair (including a wheelchair)?: Total Help needed walking in hospital room?: Total Help needed climbing 3-5 steps with a railing? : Total 6 Click Score: 6    End of Session   Activity Tolerance: Patient limited by fatigue;Patient limited by lethargy Patient left: in bed;with call bell/phone within reach;with bed alarm set;with family/visitor present   PT Visit Diagnosis: Muscle weakness (generalized) (M62.81);Difficulty in walking, not elsewhere  classified (R26.2)     Time: 8546-2703 PT Time Calculation (min) (ACUTE ONLY): 23 min  Charges:  $Therapeutic Exercise: 23-37 mins                    G Codes:        Scot Dock, PTA 04/19/2017, 10:44 AM

## 2017-04-19 NOTE — Progress Notes (Signed)
Physical Therapy Treatment Patient Details Name: Kelly Wright MRN: [id number] DOB: 1933/03/23 Today's Date: 04/19/2017    History of Present Illness 83 y/o female who lives at home with 24/7 caregivers.  She had a fall with R hip fx and underwent ORIF 10/1.  History includes Afib, CVA, dementia.    PT Comments    Pt reluctant, but agreeable to PT. Denies pain. Pt does not wish up in chair, but with encouragement agreeable to up in bed/edge of bed. Pt requires Mod A and heavy cueing throughout all tasks. Assist for trunk and lower extremities with bed mobility. Poor sitting posture requiring assist and cueing for finding midline and maintaining. Unable to maintain without assist. Transferring to stand unsuccessful without bed significantly raised. Pt has poor weight acceptance through Right lower extremity. Right lower extremity becoming tight in a internally rotated hip position and valgus knee position. Educated pt and son on this and pillow positioning to avoid this position and benefits in doing so for function. Continue PT to progress strength, endurance and weight tolerance through Right lower extremity to improve all functional mobility.    Follow Up Recommendations  SNF     Equipment Recommendations       Recommendations for Other Services       Precautions / Restrictions Precautions Precautions: Fall Restrictions Weight Bearing Restrictions: Yes RLE Weight Bearing: Weight bearing as tolerated    Mobility  Bed Mobility Overal bed mobility: Needs Assistance Bed Mobility: Supine to Sit;Sit to Supine     Supine to sit: Mod assist Sit to supine: Mod assist   General bed mobility comments: Requires cues for sequence and assist for B upper and lower extremities. Poor sitting balance  Transfers Overall transfer level: Needs assistance Equipment used: Rolling walker (2 wheeled) Transfers: Sit to/from Stand Sit to Stand: Mod assist;From elevated surface         General  transfer comment: Attempted 3x at usual bed level; unable. Bed elevated significant level to allow for sit to stand; requires Mod A. Poor acceptance of weight through RLE  Ambulation/Gait             General Gait Details: unable   Stairs            Wheelchair Mobility    Modified Rankin (Stroke Patients Only)       Balance Overall balance assessment: Needs assistance Sitting-balance support: Bilateral upper extremity supported;Feet supported Sitting balance-Leahy Scale: Poor Sitting balance - Comments: L lateral lean. Difficulty keeping RLE on the floor. Poor proprioceptive and righting awareness   Standing balance support: Bilateral upper extremity supported Standing balance-Leahy Scale: Poor Standing balance comment: Poor weight acceptance on RLE                            Cognition Arousal/Alertness: Awake/alert Behavior During Therapy: WFL for tasks assessed/performed Overall Cognitive Status: History of cognitive impairments - at baseline                                        Exercises General Exercises - Lower Extremity Ankle Circles/Pumps: AROM;Both;20 reps;Supine Quad Sets: Strengthening;Both;20 reps;Supine (difficulty; best response with therapist hands under knees) Gluteal Sets: Strengthening;Both;20 reps;Supine (increased verbal/tactile cueing) Short Arc Quad: AROM;AAROM;Both;20 reps;Supine (occasional AAROM on R) Heel Slides: AAROM;Right;20 reps;Supine (AROM with resisted ext on L) Hip ABduction/ADduction: AAROM;Right;20 reps;Supine (RROM on L)  Straight Leg Raises: AAROM;Both;10 reps;Supine    General Comments        Pertinent Vitals/Pain Faces Pain Scale: Hurts even more Pain Location: R hip (with movement) Pain Intervention(s): Limited activity within patient's tolerance;Monitored during session    Home Living                      Prior Function            PT Goals (current goals can now be found  in the care plan section) Progress towards PT goals: Progressing toward goals (slowly)    Frequency    BID      PT Plan Current plan remains appropriate    Co-evaluation              AM-PAC PT "6 Clicks" Daily Activity  Outcome Measure  Difficulty turning over in bed (including adjusting bedclothes, sheets and blankets)?: Unable Difficulty moving from lying on back to sitting on the side of the bed? : Unable Difficulty sitting down on and standing up from a chair with arms (e.g., wheelchair, bedside commode, etc,.)?: Unable Help needed moving to and from a bed to chair (including a wheelchair)?: Total Help needed walking in hospital room?: Total Help needed climbing 3-5 steps with a railing? : Total 6 Click Score: 6    End of Session   Activity Tolerance: Patient limited by fatigue;Patient limited by lethargy Patient left: in bed;with call bell/phone within reach;with bed alarm set;with family/visitor present   PT Visit Diagnosis: Muscle weakness (generalized) (M62.81);Difficulty in walking, not elsewhere classified (R26.2)     Time: 2725-3664 PT Time Calculation (min) (ACUTE ONLY): 25 min  Charges:  $Therapeutic Exercise: 23-37 mins $Therapeutic Activity: 23-37 mins                    G CodesScot Dock, PTA 04/19/2017, 2:37 PM

## 2017-04-19 NOTE — Progress Notes (Signed)
Pharmacy Antibiotic Note  Kelly Wright is a 83 y.o. female admitted on 04/11/2017 with UTI.  Pharmacy has been consulted for Zosyn dosing.  Plan: Zosyn 3.375g IV q8h (4 hour infusion).  Height: 5\' 9"  (175.3 cm) Weight: 133 lb 14.4 oz (60.7 kg) IBW/kg (Calculated) : 66.2  Temp (24hrs), Avg:97.6 F (36.4 C), Min:97.5 F (36.4 C), Max:97.7 F (36.5 C)   Recent Labs Lab 04/13/17 0347 04/14/17 0403 04/14/17 1951 04/15/17 0155 04/17/17 0217 04/18/17 0159  WBC 7.5 7.2 7.1 7.0 7.0  --   CREATININE 0.74 0.87  --  0.75  --  0.42*    Estimated Creatinine Clearance: 52 mL/min (A) (by C-G formula based on SCr of 0.42 mg/dL (L)).    No Known Allergies  Antimicrobials this admission: Cefazolin x1 Zosyn 10/8  >>    >>   Dose adjustments this admission:   Microbiology results: 10/1 MRSA PCR: (+)      10/8 UA: pending Thank you for allowing pharmacy to be a part of this patient's care.  Lillyen Schow S 04/19/2017 5:51 AM

## 2017-04-19 NOTE — Care Management Important Message (Signed)
Important Message  Patient Details  Name: Kelly Wright MRN: [id number] Date of Birth: [birth year]   Medicare Important Message Given:  Yes Signed IM notice given    Eber Hong, RN 04/19/2017, 3:44 PM

## 2017-04-20 LAB — URINE CULTURE
Culture: <10000 — AB
Special Requests: NORMAL

## 2017-04-21 ENCOUNTER — Ambulatory Visit: Payer: Medicare Other

## 2017-04-27 ENCOUNTER — Ambulatory Visit: Payer: Medicare Other

## 2017-04-29 ENCOUNTER — Ambulatory Visit: Payer: Medicare Other

## 2017-05-04 ENCOUNTER — Ambulatory Visit: Payer: Medicare Other

## 2017-05-06 ENCOUNTER — Ambulatory Visit: Payer: Medicare Other

## 2017-05-10 ENCOUNTER — Ambulatory Visit: Payer: Medicare Other

## 2017-05-12 ENCOUNTER — Ambulatory Visit: Payer: Medicare Other

## 2017-10-31 ENCOUNTER — Other Ambulatory Visit: Payer: Self-pay

## 2017-10-31 DIAGNOSIS — S00432A Contusion of left ear, initial encounter: Secondary | ICD-10-CM | POA: Insufficient documentation

## 2017-10-31 DIAGNOSIS — Y999 Unspecified external cause status: Secondary | ICD-10-CM | POA: Insufficient documentation

## 2017-10-31 DIAGNOSIS — X58XXXA Exposure to other specified factors, initial encounter: Secondary | ICD-10-CM | POA: Insufficient documentation

## 2017-10-31 DIAGNOSIS — H9202 Otalgia, left ear: Secondary | ICD-10-CM | POA: Diagnosis present

## 2017-10-31 DIAGNOSIS — Y929 Unspecified place or not applicable: Secondary | ICD-10-CM | POA: Diagnosis not present

## 2017-10-31 DIAGNOSIS — Z79899 Other long term (current) drug therapy: Secondary | ICD-10-CM | POA: Diagnosis not present

## 2017-10-31 DIAGNOSIS — Y939 Activity, unspecified: Secondary | ICD-10-CM | POA: Diagnosis not present

## 2017-10-31 NOTE — ED Notes (Signed)
Patient is here with her legal guardian.

## 2017-10-31 NOTE — ED Triage Notes (Addendum)
Family reports care taker noticed area on left ear on Friday and they are concerned it might be infected.  Patient with noted swelling to left ear.

## 2017-11-01 ENCOUNTER — Emergency Department
Admission: EM | Admit: 2017-11-01 | Discharge: 2017-11-01 | Disposition: A | Discharge status: Home / Self Care | Payer: Medicare Other | Attending: Emergency Medicine | Admitting: Emergency Medicine

## 2017-11-01 DIAGNOSIS — S00432A Contusion of left ear, initial encounter: Secondary | ICD-10-CM | POA: Diagnosis not present

## 2017-11-01 MED ORDER — LIDOCAINE-EPINEPHRINE (PF) 1 %-1:200000 IJ SOLN
INTRAMUSCULAR | Status: AC
  Administered 2017-11-01: 30 mL via INTRADERMAL
  Filled 2017-11-01: qty 30

## 2017-11-01 MED ORDER — LIDOCAINE-EPINEPHRINE (PF) 2 %-1:200000 IJ SOLN: 30.0000 mL | Freq: Once | INTRAMUSCULAR | Status: DC

## 2017-11-01 MED ORDER — LIDOCAINE-EPINEPHRINE (PF) 1 %-1:200000 IJ SOLN
30.0000 mL | Freq: Once | INTRAMUSCULAR | Status: AC
  Administered 2017-11-01: 30 mL via INTRADERMAL

## 2017-11-01 NOTE — ED Notes (Addendum)
Pt with large hematoma at left ear, pt with daughter (legal guardian) who reports swelling noticed earlier today by another caregiver, pt denies pain and NAD  Circulation appears intact to pinna and auricle, daughter reports hearing appears diminished more than normal

## 2017-11-01 NOTE — Discharge Instructions (Signed)
Please keep your ear clean and dry and follow-up with the ear nose and throat specialist Monday or Tuesday for reevaluation.  Return to the emergency department sooner for any concerns whatsoever.  It was a pleasure to take care of you today, and thank you for coming to our emergency department.  If you have any questions or concerns before leaving please ask the nurse to grab me and I'm more than happy to go through your aftercare instructions again.  If you were prescribed any opioid pain medication today such as Norco, Vicodin, Percocet, morphine, hydrocodone, or oxycodone please make sure you do not drive when you are taking this medication as it can alter your ability to drive safely.  If you have any concerns once you are home that you are not improving or are in fact getting worse before you can make it to your follow-up appointment, please do not hesitate to call 911 and come back for further evaluation.  Merrily Brittle, MD

## 2017-11-01 NOTE — ED Provider Notes (Signed)
[facility] Emergency Department Provider Note  ____________________________________________   First MD Initiated Contact with Patient 11/01/17 0030     (approximate)  I have reviewed the triage vital signs and the nursing notes.   HISTORY  Chief Complaint Wound Infection    HPI Kelly Wright is a 84 y.o. female who comes to the emergency department with her caretaker after noting about 48 hours of progressive swelling to her left ear.  The swelling was gradual onset is now progressive and moderate to severe.  The patient does not note any particular trauma.  She denies fevers or chills.  She does have "fullness" in the left ear.  Associated with mild to moderate severity pain.  Worse when touching improved with not touching.  Past Medical History:  Diagnosis Date  . Arthritis   . Atrial fibrillation (HCC)   . Cerebral vascular accident (HCC)   . DVT (deep venous thrombosis) (HCC)   . Dysrhythmia   . GERD (gastroesophageal reflux disease)   . Interstitial lung disease (HCC)   . Osteoporosis     Patient Active Problem List   Diagnosis Date Noted  . Closed displaced intertrochanteric fracture of right femur (HCC) 04/11/2017  . Infection of urinary tract 02/21/2016  . Dementia 02/21/2016  . Generalized weakness 02/21/2016  . Slurred speech 02/20/2016  . Depression 02/20/2016  . Broken ribs 08/11/2015  . UTI (lower urinary tract infection) 11/12/2014  . Encephalopathy, metabolic 11/12/2014  . Rheumatoid arthritis (HCC) 11/12/2014  . Chronic anticoagulation 11/12/2014  . Atrial fibrillation (HCC) 11/12/2014  . H/O: CVA (cerebrovascular accident) 11/12/2014    Past Surgical History:  Procedure Laterality Date  . ABDOMINAL HYSTERECTOMY    . BILATERAL CARPAL TUNNEL RELEASE    . EYE SURGERY    . INTRAMEDULLARY (IM) NAIL INTERTROCHANTERIC Right 04/12/2017   Procedure: INTRAMEDULLARY (IM) NAIL INTERTROCHANTRIC;  Surgeon: Christena Flake, MD;   Location: [facility] ORS;  Service: Orthopedics;  Laterality: Right;    Prior to Admission medications   Medication Sig Start Date End Date Taking? Authorizing Provider  acetaminophen (TYLENOL) 500 MG tablet Take 1 tablet (500 mg total) by mouth every 6 (six) hours. 04/19/17   Ramonita Lab, MD  amiodarone (PACERONE) 400 MG tablet Take 1 tablet (400 mg total) by mouth 2 (two) times daily. 04/19/17   Gouru, Deanna Artis, MD  citalopram (CELEXA) 20 MG tablet Take 30 mg by mouth at bedtime.     [provider]  CRANBERRY CONCENTRATE PO Take 500 mg by mouth daily.     [provider]  cyclobenzaprine (FLEXERIL) 5 MG tablet Take 1 tablet (5 mg total) by mouth 3 (three) times daily as needed for muscle spasms. 04/19/17   Ramonita Lab, MD  diphenhydrAMINE (BENADRYL) 12.5 MG/5ML elixir Take 5-10 mLs (12.5-25 mg total) by mouth every 4 (four) hours as needed for itching. 04/19/17   Gouru, Deanna Artis, MD  donepezil (ARICEPT) 10 MG tablet Take 10 mg by mouth at bedtime.    [provider]  folic acid (FOLVITE) 400 MCG tablet Take 400 mcg by mouth at bedtime.    [provider]  HYDROcodone-acetaminophen (NORCO/VICODIN) 5-325 MG tablet Take 1 tablet by mouth every 6 (six) hours as needed for moderate pain or severe pain. 04/19/17   Gouru, Deanna Artis, MD  loratadine (CLARITIN) 10 MG tablet Take 10 mg by mouth daily.    [provider]  methotrexate (RHEUMATREX) 2.5 MG tablet Take 12.5 mg by mouth every Wednesday.  [provider]  metoprolol succinate (TOPROL-XL) 25 MG 24 hr tablet Take 1 tablet (25 mg total) by mouth daily. 04/20/17   Gouru, Deanna Artis, MD  pantoprazole (PROTONIX) 40 MG tablet Take 1 tablet (40 mg total) by mouth daily. 04/19/17   Ramonita Lab, MD    Allergies Patient has no known allergies.  Family History  Problem Relation Age of Onset  . Hodgkin's lymphoma Mother   . Cancer - Lung Father   . CVA Other     Social History Social History   Tobacco Use  .  Smoking status: Never Smoker  . Smokeless tobacco: Never Used  Substance Use Topics  . Alcohol use: No  . Drug use: No    Review of Systems Constitutional: No fever/chills Eyes: No visual changes. ENT: Positive for ear pain Cardiovascular: Denies chest pain. Respiratory: Denies shortness of breath. Gastrointestinal: No abdominal pain.  No nausea, no vomiting.  No diarrhea.  No constipation. Genitourinary: Negative for dysuria. Musculoskeletal: Negative for back pain. Skin: Negative for rash. Neurological: Negative for headaches, focal weakness or numbness.   ____________________________________________   PHYSICAL EXAM:  VITAL SIGNS: ED Triage Vitals  Enc Vitals Group     BP 10/31/17 2215 (!) 108/58     Pulse Rate 10/31/17 2215 70     Resp 10/31/17 2215 20     Temp 10/31/17 2215 98.4 F (36.9 C)     Temp Source 10/31/17 2215 Oral     SpO2 10/31/17 2215 98 %     Weight 10/31/17 2214 135 lb (61.2 kg)     Height 10/31/17 2214 5\' 9"  (1.753 m)     Head Circumference --      Peak Flow --      Pain Score --      Pain Loc --      Pain Edu? --      Excl. in GC? --     Constitutional: Alert and oriented x4 pleasant cooperative speaks in full clear sentences no diaphoresis Eyes: PERRL EOMI. Head: Right ear is normal.  Left ear edematous with overlying fluctuance although no induration.  Appears to be an irregular seroma versus hematoma slight erythema but no warmth. Nose: No congestion/rhinnorhea. Mouth/Throat: No trismus Neck: No stridor.   Cardiovascular: Normal rate, regular rhythm. Grossly normal heart sounds.  Good peripheral circulation. Respiratory: Normal respiratory effort.  No retractions. Lungs CTAB and moving good air Gastrointestinal: Soft nontender Musculoskeletal: No lower extremity edema   Neurologic:  Normal speech and language. No gross focal neurologic deficits are appreciated. Skin:  Skin is warm, dry and intact. No rash noted. Psychiatric: Mood and  affect are normal. Speech and behavior are normal.    ____________________________________________   DIFFERENTIAL includes but not limited to  Auricular hematoma, otitis externa, perichondritis ocular seroma ____________________________________________   LABS (all labs ordered are listed, but only abnormal results are displayed)  Labs Reviewed - No data to display   __________________________________________  EKG   ____________________________________________  RADIOLOGY   ____________________________________________   PROCEDURES  Procedure(s) performed: Yes  . Incision and Drainage Date/Time: 11/01/2017 1:11 AM Performed by: 11/03/2017, MD Authorized by: Merrily Brittle, MD   Consent:    Consent obtained:  Verbal   Consent given by:  Patient   Risks discussed:  Bleeding, incomplete drainage, infection and pain   Alternatives discussed:  No treatment and delayed treatment Location:    Type:  Hematoma   Size:  8 cm left ear   Location:  Head  Head location:  L external ear Pre-procedure details:    Skin preparation:  Antiseptic wash Anesthesia (see MAR for exact dosages):    Anesthesia method:  Local infiltration   Local anesthetic:  Lidocaine 1% WITH epi Procedure type:    Complexity:  Complex Procedure details:    Needle aspiration: no     Incision types:  Single straight   Scalpel blade:  15   Wound management:  Probed and deloculated and extensive cleaning   Drainage:  Bloody   Drainage amount:  Moderate   Packing material: I used a total of two 4-0 nylon sutures to sew a pressure dressing in place to the left ear. Post-procedure details:    Patient tolerance of procedure:  Tolerated well, no immediate complications .Nerve Block Date/Time: 11/01/2017 1:11 AM Performed by: Merrily Brittle, MD Authorized by: Merrily Brittle, MD   Consent:    Consent obtained:  Verbal   Consent given by:  Patient Indications:    Indications:  Procedural  anesthesia Location:    Body area:  Head   Head nerve:  Auricular   Laterality:  Left Pre-procedure details:    Skin preparation:  Alcohol Skin anesthesia (see MAR for exact dosages):    Skin anesthesia method:  Local infiltration   Local anesthetic:  Lidocaine 1% WITH epi Procedure details (see MAR for exact dosages):    Block needle gauge:  25 G Post-procedure details:    Dressing:  None   Outcome:  Anesthesia achieved Comments:     Achieved near complete anesthesia    Critical Care performed: no  Observation: no ____________________________________________   INITIAL IMPRESSION / ASSESSMENT AND PLAN / ED COURSE  Pertinent labs & imaging results that were available during my care of the patient were reviewed by me and considered in my medical decision making (see chart for details).  The patient is well-appearing her ear is edematous and erythematous and appears to be either perichondritis with abscess versus an auricular hematoma.  I have a call out to otolaryngology now.     I discussed the case with on-call otolaryngologist Dr. Gershon Crane who indicated this most likely represents an auricular hematoma versus seroma.  He recommends local anesthetic and then incision and drainage with suturing a pressure dressing onto the resolved hematoma and following up in clinic.  The patient and family verbally consented and I performed the procedure expressing a large amount of hematoma.  I then used 3 stitches to use so a pressure dressing to the left ear with good results.  The patient's pain is adequately controlled.  She is discharged home in improved condition verbalizes understanding and agreement with the plan.  No indication for antibiotics at this time. ____________________________________________   FINAL CLINICAL IMPRESSION(S) / ED DIAGNOSES  Final diagnoses:  Hematoma of ear, left, initial encounter      NEW MEDICATIONS STARTED DURING THIS VISIT:  Discharge  Medication List as of 11/01/2017  1:11 AM       Note:  This document was prepared using Dragon voice recognition software and may include unintentional dictation errors.     Merrily Brittle, MD 11/01/17 [phone]

## 2017-11-01 NOTE — ED Notes (Signed)
Pt discharged with legal guardian

## 2017-11-05 ENCOUNTER — Inpatient Hospital Stay
Admission: RE | Admit: 2017-11-05 | Discharge: 2017-11-05 | Disposition: A | Discharge status: Home / Self Care | Payer: Medicare Other | Source: Ambulatory Visit

## 2017-11-08 ENCOUNTER — Ambulatory Visit: Admit: 2017-11-08 | Payer: Medicare Other | Admitting: Otolaryngology

## 2017-11-08 SURGERY — EVACUATION HEMATOMA
Anesthesia: Choice | Laterality: Left

## 2017-12-31 ENCOUNTER — Emergency Department
Admission: EM | Admit: 2017-12-31 | Discharge: 2017-12-31 | Disposition: A | Discharge status: Home / Self Care | Payer: Medicare Other | Attending: Emergency Medicine | Admitting: Emergency Medicine

## 2017-12-31 ENCOUNTER — Emergency Department: Payer: Medicare Other

## 2017-12-31 ENCOUNTER — Encounter: Payer: Self-pay | Admitting: Emergency Medicine

## 2017-12-31 DIAGNOSIS — F0390 Unspecified dementia without behavioral disturbance: Secondary | ICD-10-CM | POA: Diagnosis not present

## 2017-12-31 DIAGNOSIS — H3321 Serous retinal detachment, right eye: Secondary | ICD-10-CM | POA: Insufficient documentation

## 2017-12-31 DIAGNOSIS — H409 Unspecified glaucoma: Secondary | ICD-10-CM | POA: Insufficient documentation

## 2017-12-31 DIAGNOSIS — Z7982 Long term (current) use of aspirin: Secondary | ICD-10-CM | POA: Insufficient documentation

## 2017-12-31 DIAGNOSIS — H547 Unspecified visual loss: Secondary | ICD-10-CM | POA: Diagnosis present

## 2017-12-31 DIAGNOSIS — Z79899 Other long term (current) drug therapy: Secondary | ICD-10-CM | POA: Insufficient documentation

## 2017-12-31 MED ORDER — LATANOPROST 0.005 % OP SOLN: 1.0000 [drp] | Freq: Every day | OPHTHALMIC | 0 refills | Status: AC

## 2017-12-31 MED ORDER — DORZOLAMIDE HCL-TIMOLOL MAL 2-0.5 % OP SOLN
1.0000 [drp] | Freq: Two times a day (BID) | OPHTHALMIC | Status: DC
  Administered 2017-12-31: 1 [drp] via OPHTHALMIC
  Filled 2017-12-31 (×2): qty 10

## 2017-12-31 MED ORDER — DORZOLAMIDE HCL-TIMOLOL MAL 2-0.5 % OP SOLN: 1.0000 [drp] | Freq: Two times a day (BID) | OPHTHALMIC | 0 refills | Status: AC

## 2017-12-31 MED ORDER — LATANOPROST 0.005 % OP SOLN
1.0000 [drp] | Freq: Every day | OPHTHALMIC | Status: DC
  Administered 2017-12-31: 1 [drp] via OPHTHALMIC
  Filled 2017-12-31 (×2): qty 2.5

## 2017-12-31 NOTE — Consult Note (Signed)
Reason for Consult:loss of vsion Referring Physician: ED -   Kelly Wright is an 84 y.o. female.  Chief complaint: bilateral loss of vision x 4 days <principal problem not specified>  HPI: 84 yo female ho cataract surgery OU presents with 4 days progressive va loss noted by daughter.  Today pt cannot see to ambulate and feed herself.  Presented to ER. CT negative for acute CVA.   Pt has dementia and history obtained from daughter. Pt had "normal" va until about 1 week ago with progressive loss.  Has recently moved from home with full timke care to a nursing facility.  Some changes in mental status noted by daughter but denies any weakness, dizziness, etc. No pain or discharge from eyes.  Redness OS x 3 months.  Pt has had ear problem - hematoma drained several times.  Past ocular hx: Cataract surgery OU approx 20 years ago (Dr Kelly Wright?) Past Medical History:  Diagnosis Date  . Arthritis   . Atrial fibrillation ([place])   . Cerebral vascular accident ([place])   . DVT (deep venous thrombosis) ([place])   . Dysrhythmia   . GERD (gastroesophageal reflux disease)   . Interstitial lung disease ([place])   . Osteoporosis     ROS  No weakness dizziness numbness Some mentql status decline with aqdvancing dementia and recent move to nursing facility   Past Surgical History:  Procedure Laterality Date  . ABDOMINAL HYSTERECTOMY    . BILATERAL CARPAL TUNNEL RELEASE    . EYE SURGERY    . INTRAMEDULLARY (IM) NAIL INTERTROCHANTERIC Right 04/12/2017   Procedure: INTRAMEDULLARY (IM) NAIL INTERTROCHANTRIC;  Surgeon: Kelly Mull, MD;  Location: [facility] ORS;  Service: Orthopedics;  Laterality: Right;    Family History  Problem Relation Age of Onset  . Hodgkin's lymphoma Mother   . Cancer - Lung Father   . CVA Other     Social History:  reports that she has never smoked. She has never used smokeless tobacco. She reports that she does not drink alcohol or use drugs.  Allergies: No Known  Allergies  Medications: reviewed pt's outpatinet medication.  She does not use any eyedrops. No results found for this or any previous visit (from the past 48 hour(s)).  Ct Head Wo Contrast  Result Date: 12/31/2017 CLINICAL DATA:  Focal neural deficit greater than 6 hours, stroke suspected. EXAM: CT HEAD WITHOUT CONTRAST TECHNIQUE: Contiguous axial images were obtained from the base of the skull through the vertex without intravenous contrast. COMPARISON:  CT scan of April 11, 2017. FINDINGS: Brain: Mild diffuse cortical atrophy is noted. Mild chronic ischemic white matter disease is noted. No mass effect or midline shift is noted. Ventricular size is within normal limits. There is no evidence of mass lesion, hemorrhage or acute infarction. Vascular: No hyperdense vessel or unexpected calcification. Skull: Normal. Negative for fracture or focal lesion. Sinuses/Orbits: No acute finding. Other: None. IMPRESSION: Mild diffuse cortical atrophy. Mild chronic ischemic white matter disease. No acute intracranial abnormality seen. Electronically Signed   By: Kelly Wright, M.D.   On: 12/31/2017 13:57    Blood pressure 122/60, pulse 71, temperature 98.3 F (36.8 C), temperature source Oral, resp. rate 18, height 5' 6"  (1.676 m), weight 54.4 kg (120 lb), SpO2 100 %.  Mental status: Alert and Oriented x 4  Visual Acuity:  20/cf at 65f OD  20/cf at 2 ft near [place]  Pupils:  Sluggish and minimally reactive OU  No Afferent defect.  Motility:  Full/  orthophoric  Visual Fields:  constricted bilaterally and very inconsistent.  IOP:  25 OD 48 OS [place]  External/ Lids/ Lashes:  Normal pulsatile nontender tempora arteries bilat  Anterior Segment:  Conjunctiva:  Normal  OU  Cornea:  Normal  OU  Anterior Chamber: Normal  OU  Lens:   aphakia OU  Posterior Segment: Dilated OU with 1% Tropicamide and 2.5% Phenylephrine         (Difficult exam)   Discs:   0.9 C OD with inferior notch    Total cup  OS   Macula:  Subretinal fluid OD  Vessels/ Periphery: OD inferior Macula-off RD with large tear inferiorly     OS - inferior large hole and demarcated detachment    Assessment/Plan: Macula-off Ret Detachment OD - This seems to be the reason for the recent vision loss since left eye is nearly blind already due to advanced glaucoma.  Prognosis is limited given untreated advanced glaucoma already.  Aphakia OU  Advanced/ end stage glaucoma OS> OD  - IOP of 25 OD and 48 OS needs treatment. Will start with Cosopt and latanoprost OU  Will discuss with [facility] retina regarding referral this week.   Kelly Wright 12/31/2017, 5:29 PM

## 2017-12-31 NOTE — ED Notes (Signed)
Daughter with patient at bedside/ pt able to see 2 fingers held up in front / daughter states patients mood and eye sight has changed in the last 5 days but patient has apetite

## 2017-12-31 NOTE — ED Triage Notes (Signed)
Patient has had a sudden change of vision for the past 4 days.  Patient was recent placed on prednisone and then metformin.  Patient had a recent ear problem that she was on antibiotics for.  Patient denies vision problems but is unable to answer correctly how many fingers this nurse is holding up in front of her.  Patient has history of dementia.  Patient's daughter Kelly Wright is her legal guardian and is with her at this time.

## 2017-12-31 NOTE — ED Provider Notes (Signed)
[facility] Emergency Department Provider Note  Time seen: 4:56 PM  I have reviewed the triage vital signs and the nursing notes.   HISTORY  Chief Complaint Loss of Vision    HPI Kelly Wright is a 84 y.o. female with a past medical history of arthritis, DVT, gastric reflux, dementia, presents to the emergency department for visual deficits.  Daughter states 4 days ago they first noticed that the patient was having more difficulty seeing than normal.  Patient will state that she is not having any difficulty, the daughter made the patient appointment with [facility] on Monday however today the patient cannot find her utensils to eat off of her tray even while wearing her eyeglasses.  Patient states that she is able to see shadows and light but that is about it at this point.  Denies any headache.  Denies any weakness or numbness of any arm or leg, confusion or slurred speech.  Largely negative review of systems per patient and daughter.  Past Medical History:  Diagnosis Date  . Arthritis   . Atrial fibrillation (HCC)   . Cerebral vascular accident (HCC)   . DVT (deep venous thrombosis) (HCC)   . Dysrhythmia   . GERD (gastroesophageal reflux disease)   . Interstitial lung disease (HCC)   . Osteoporosis     Patient Active Problem List   Diagnosis Date Noted  . Closed displaced intertrochanteric fracture of right femur (HCC) 04/11/2017  . Infection of urinary tract 02/21/2016  . Dementia 02/21/2016  . Generalized weakness 02/21/2016  . Slurred speech 02/20/2016  . Depression 02/20/2016  . Broken ribs 08/11/2015  . UTI (lower urinary tract infection) 11/12/2014  . Encephalopathy, metabolic 11/12/2014  . Rheumatoid arthritis (HCC) 11/12/2014  . Chronic anticoagulation 11/12/2014  . Atrial fibrillation (HCC) 11/12/2014  . H/O: CVA (cerebrovascular accident) 11/12/2014    Past Surgical History:  Procedure Laterality Date  . ABDOMINAL HYSTERECTOMY     . BILATERAL CARPAL TUNNEL RELEASE    . EYE SURGERY    . INTRAMEDULLARY (IM) NAIL INTERTROCHANTERIC Right 04/12/2017   Procedure: INTRAMEDULLARY (IM) NAIL INTERTROCHANTRIC;  Surgeon: Christena Flake, MD;  Location: [facility] ORS;  Service: Orthopedics;  Laterality: Right;    Prior to Admission medications   Medication Sig Start Date End Date Taking? Authorizing Provider  acetaminophen (TYLENOL) 500 MG tablet Take 1 tablet (500 mg total) by mouth every 6 (six) hours. 04/19/17   Ramonita Lab, MD  amiodarone (PACERONE) 400 MG tablet Take 1 tablet (400 mg total) by mouth 2 (two) times daily. Patient taking differently: Take 200 mg by mouth daily.  04/19/17   Ramonita Lab, MD  aspirin EC 81 MG tablet Take 81 mg by mouth daily.    [provider]  citalopram (CELEXA) 20 MG tablet Take 30 mg by mouth at bedtime.     [provider]  cyclobenzaprine (FLEXERIL) 5 MG tablet Take 1 tablet (5 mg total) by mouth 3 (three) times daily as needed for muscle spasms. 04/19/17   Gouru, Deanna Artis, MD  donepezil (ARICEPT) 10 MG tablet Take 10 mg by mouth at bedtime.    [provider]  folic acid (FOLVITE) 400 MCG tablet Take 400 mcg by mouth at bedtime.    [provider]  methotrexate (RHEUMATREX) 2.5 MG tablet Take 12.5 mg by mouth every Wednesday.     [provider]  metoprolol succinate (TOPROL-XL) 25 MG 24 hr tablet Take 1 tablet (25 mg total) by mouth  daily. 04/20/17   Gouru, Deanna Artis, MD  pantoprazole (PROTONIX) 40 MG tablet Take 1 tablet (40 mg total) by mouth daily. Patient not taking: Reported on 11/04/2017 04/19/17   Ramonita Lab, MD  ranitidine (ZANTAC) 150 MG tablet Take 150 mg by mouth 2 (two) times daily.    [provider]  vitamin B-12 (CYANOCOBALAMIN) 1000 MCG tablet Take 1,000 mcg by mouth daily.    [provider]    No Known Allergies  Family History  Problem Relation Age of Onset  . Hodgkin's lymphoma Mother   . Cancer - Lung Father   . CVA  Other     Social History Social History   Tobacco Use  . Smoking status: Never Smoker  . Smokeless tobacco: Never Used  Substance Use Topics  . Alcohol use: No  . Drug use: No    Review of Systems Constitutional: Negative for fever. Eyes: Decreased vision of the past 4 days ENT: Negative for recent illness/congestion Cardiovascular: Negative for chest pain. Respiratory: Negative for shortness of breath. Gastrointestinal: Negative for abdominal pain Genitourinary: Negative for urinary compaints Musculoskeletal: Negative for musculoskeletal complaints Skin: Negative for skin complaints  Neurological: Negative for headache.  Negative for weakness or numbness. All other ROS negative  ____________________________________________   PHYSICAL EXAM:  VITAL SIGNS: ED Triage Vitals  Enc Vitals Group     BP 12/31/17 1322 122/60     Pulse Rate 12/31/17 1322 71     Resp 12/31/17 1322 18     Temp 12/31/17 1322 98.3 F (36.8 C)     Temp Source 12/31/17 1322 Oral     SpO2 12/31/17 1322 100 %     Weight 12/31/17 1323 120 lb (54.4 kg)     Height 12/31/17 1323 5\' 6"  (1.676 m)     Head Circumference --      Peak Flow --      Pain Score 12/31/17 1322 0     Pain Loc --      Pain Edu? --      Excl. in GC? --    Constitutional: Alert and oriented. Well appearing and in no distress. Eyes: Slight conjunctival injection of the left eye.  Extraocular muscles appear intact.  Pupils appear responsive.  Patient is not able to make out finger counts with either eye.  Not able to tell me when I am shining a light in her right eye, is able to tell me when I am shining light in her left eye. ENT   Head: Normocephalic and atraumatic.   Mouth/Throat: Mucous membranes are moist. Cardiovascular: Normal rate, regular rhythm. Respiratory: Normal respiratory effort without tachypnea nor retractions. Breath sounds are clear  Gastrointestinal: Soft and nontender. No distention. Musculoskeletal:  Nontender with normal range of motion in all extremities.  Neurologic:  Normal speech and language. No gross focal neurologic deficits.  Equal grip strength.  5/5 motor in all extremities. Skin:  Skin is warm, dry and intact.  Psychiatric: Mood and affect are normal.   ____________________________________________   RADIOLOGY  CT head negative  ____________________________________________   INITIAL IMPRESSION / ASSESSMENT AND PLAN / ED COURSE  Pertinent labs & imaging results that were available during my care of the patient were reviewed by me and considered in my medical decision making (see chart for details).  Patient presents to the emergency department for 4 days of visual loss.  Daughter states the patient was unable to find her fork while attempting to eat today.  She had made  an eye appointment for Monday but because of the acute visual change she brought the patient to the emergency department.  Here the patient is awake alert, has no complaints.  When asked specifically about her vision she says she thinks it is fine however on examination she is not able to make out any light discrimination with the right eye, is able to probably tell me light discrimination with the left eye, unable to make out finger counts with either eye.  I discussed patient with ophthalmology Dr. Sharman Crate who will come in to see the patient for an eye exam.  CT head is negative.  Patient has been seen by Dr. Sharman Crate of ophthalmology states the patient has likely chronic glaucoma of the left eye, now has a retinal detachment in the right eye.  This would explain her visual loss over the past several days.  He has arranged for the patient be followed up at [facility].  Daughter agreeable to this plan of care.  We will obtain prescribed eyedrops by Dr. Sharman Crate from the pharmacy prior to discharge to a nursing facility.  ____________________________________________   FINAL CLINICAL IMPRESSION(S) / ED  DIAGNOSES  Visual loss Retinal detachment Glaucoma   Minna Antis, MD 12/31/17 1836

## 2017-12-31 NOTE — Discharge Instructions (Addendum)
Please use her eyedrops as prescribed.  Please follow-up with [facility] ophthalmology as soon as possible as scheduled.

## 2018-08-11 IMAGING — MR MR MRA HEAD W/O CM
10 of 11 series · 35 of 48 positions shown · non-contrast
Comparison: CT head without contrast 02/20/2016.

CLINICAL DATA: Code stroke yesterday. Slurred speech, dizziness,
nausea, and near syncopal episode. Weakness.

EXAM:
MRI HEAD WITHOUT CONTRAST
MRA HEAD WITHOUT CONTRAST
TECHNIQUE: Multiplanar, multiecho pulse sequences of the brain and surrounding
structures were obtained without intravenous contrast. Angiographic
images of the head were obtained using MRA technique without
contrast.

[Series 2: T1 · sagittal · 5.0mm · 0.45mm/px · 2 of 29 slices shown (1 of 2)]
[im 1/29]
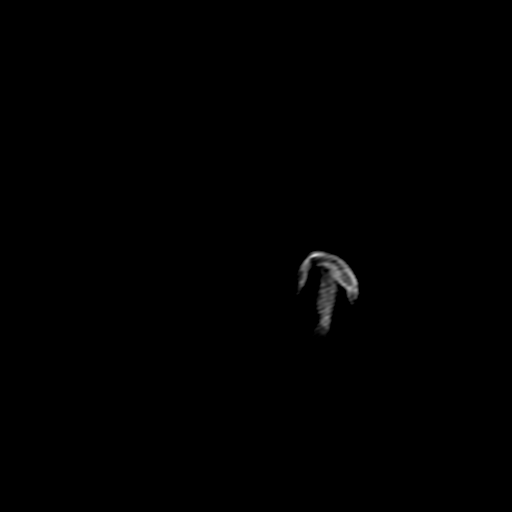
[im 29/29]
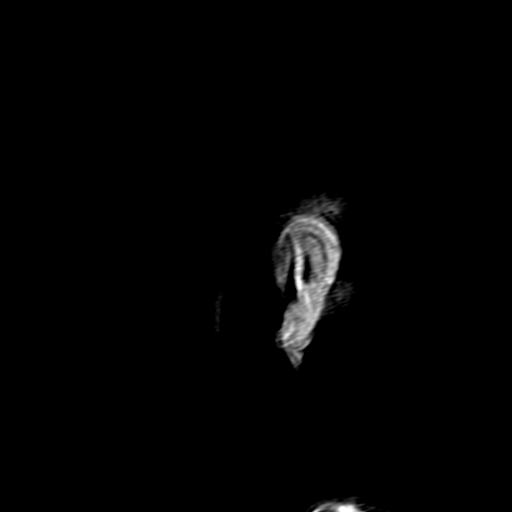

[Series 4: DWI · axial · 4.0mm · 0.94mm/px · z∈[-70,+101]mm · 4 of 44 slices shown (1 of 2)]
[im 1/44]
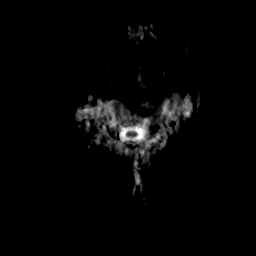
[im 15/44]
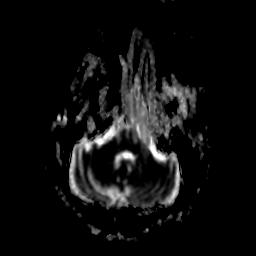
[im 29/44]
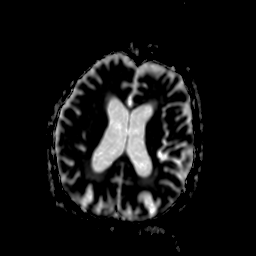
[im 44/44]
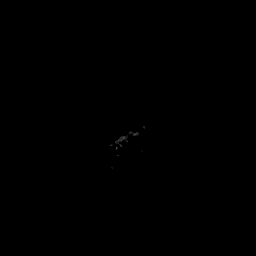

[Series 5: ax (id) · axial · 4.0mm · 0.94mm/px · z∈[-62,+97]mm · 4 of 40 slices shown]
[im 1/40]
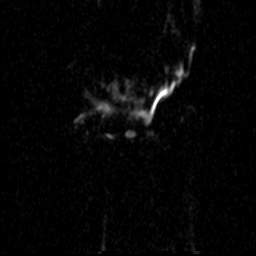
[im 14/40]
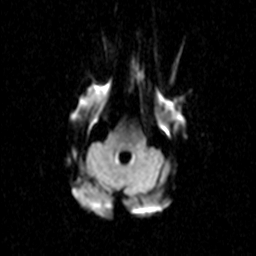
[im 27/40]
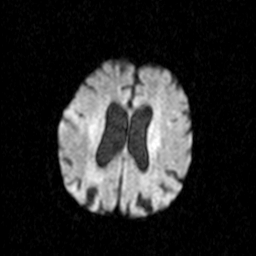
[im 40/40]
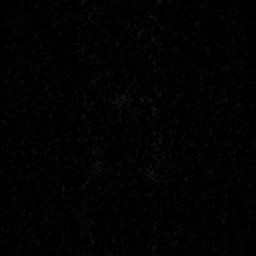

[Series 7: DWI · coronal · 5.0mm · 1.80mm/px · 4 of 39 slices shown (2 of 2)]
[im 1/39]
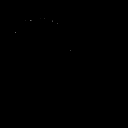
[im 13/39]
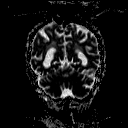
[im 26/39]
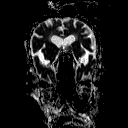
[im 39/39]
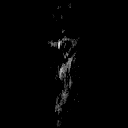

[Series 8: cor (id) · coronal · 5.0mm · 1.80mm/px · 3 of 31 slices shown]
[im 1/31]
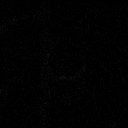
[im 16/31]
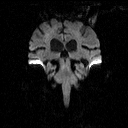
[im 31/31]
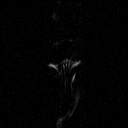

[Series 13: T2 · axial · 5.0mm · 0.45mm/px · z∈[-67,+101]mm · 3 of 27 slices shown (1 of 3)]
[im 1/27]
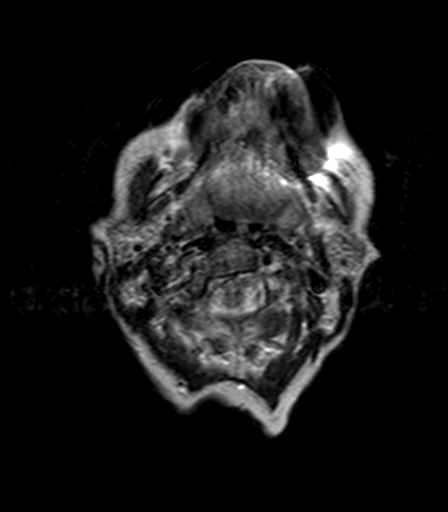
[im 14/27]
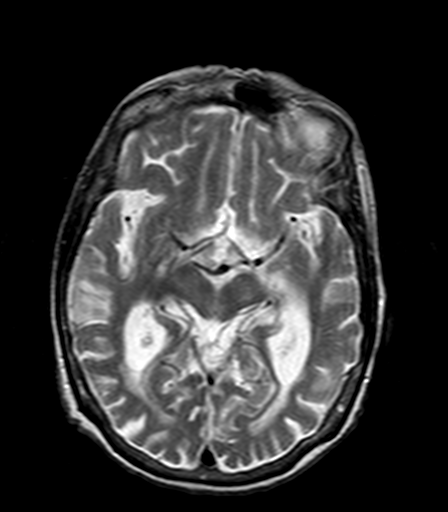
[im 27/27]
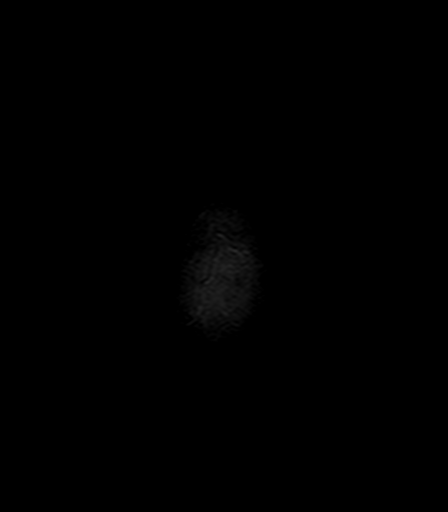

[Series 14: FLAIR · axial · 5.0mm · 0.90mm/px · z∈[-67,+102]mm · 3 of 27 slices shown]
[im 1/27]
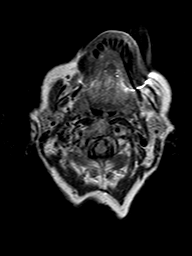
[im 14/27]
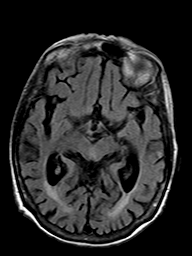
[im 27/27]
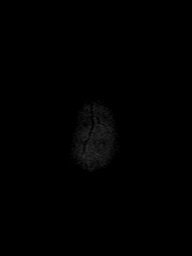

[Series 15: T2 · axial · 5.0mm · 0.45mm/px · z∈[-67,+101]mm · 3 of 27 slices shown (2 of 3)]
[im 1/27]
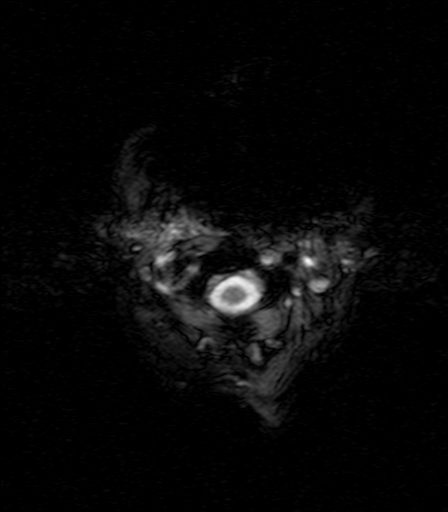
[im 14/27]
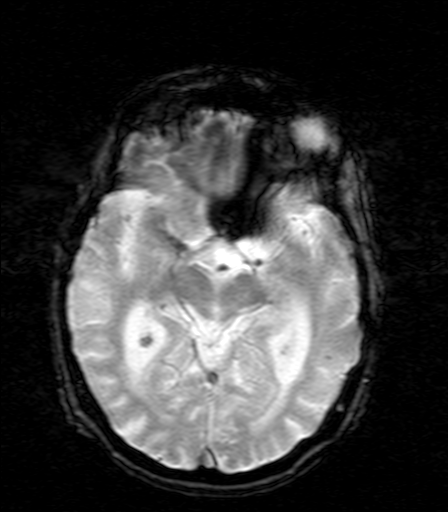
[im 27/27]
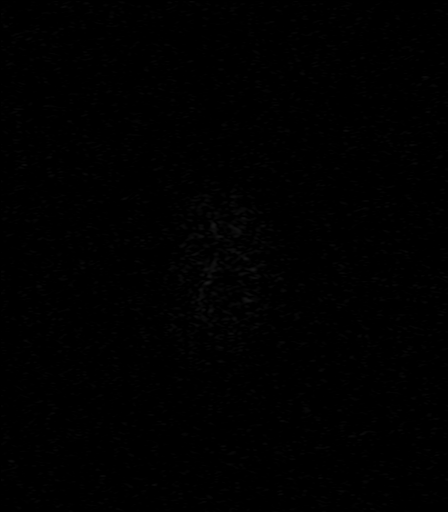

[Series 16: T1 · axial · 3.0mm · 0.45mm/px · z∈[-71,+106]mm · 6 of 60 slices shown (2 of 2)]
[im 1/60]
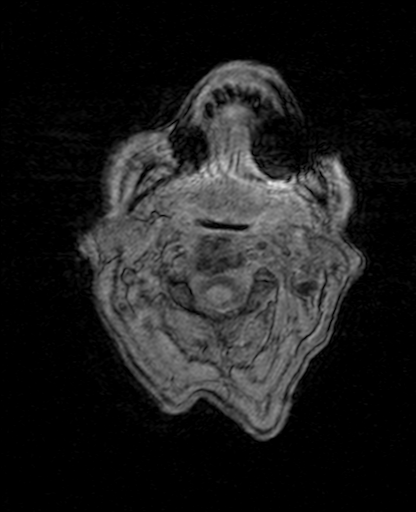
[im 12/60]
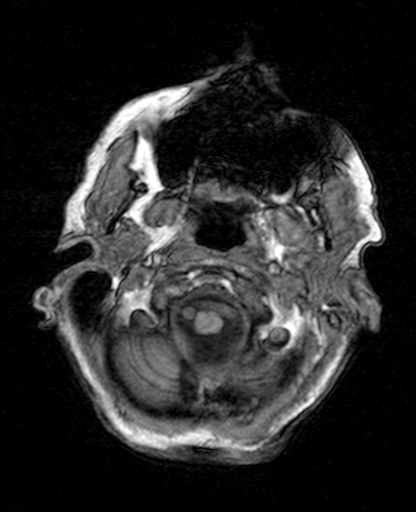
[im 24/60]
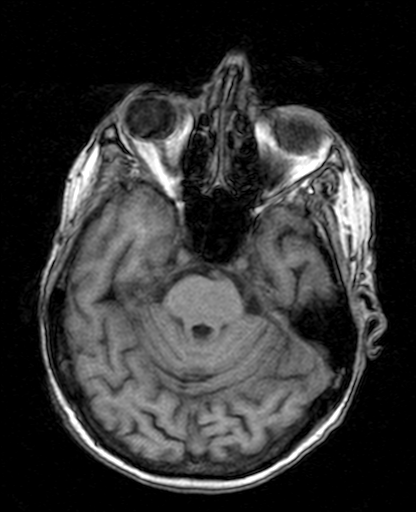
[im 36/60]
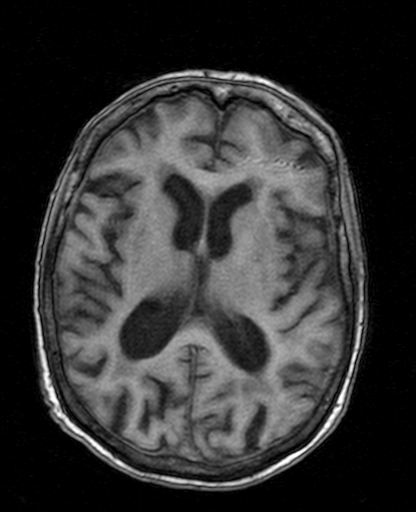
[im 48/60]
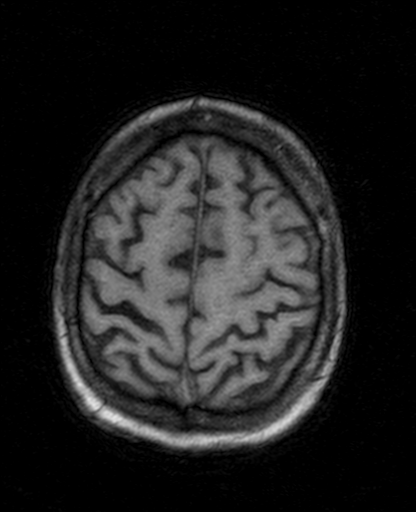
[im 60/60]
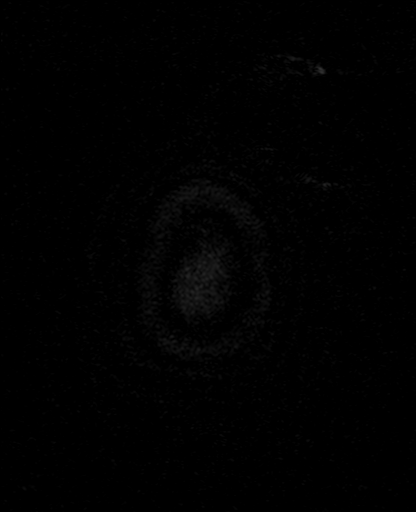

[Series 17: T2 · coronal · 5.0mm · 0.45mm/px · 3 of 29 slices shown (3 of 3)]
[im 1/29]
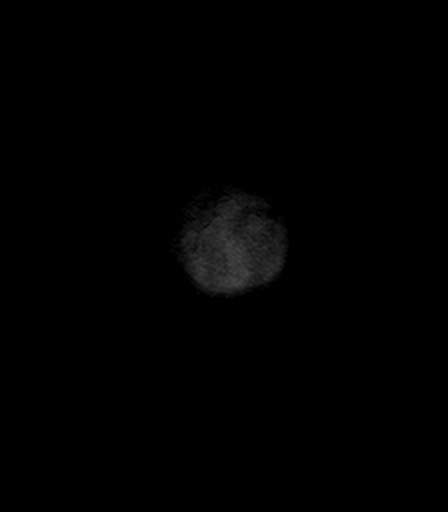
[im 15/29]
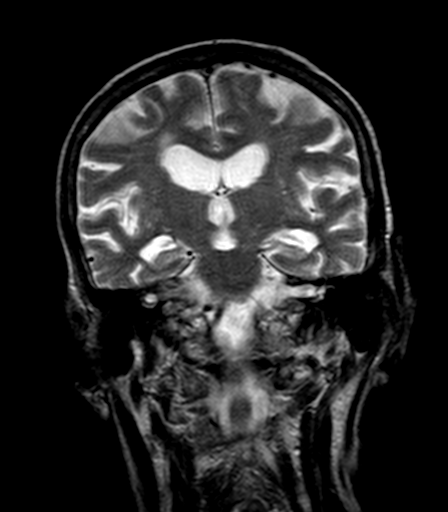
[im 29/29]
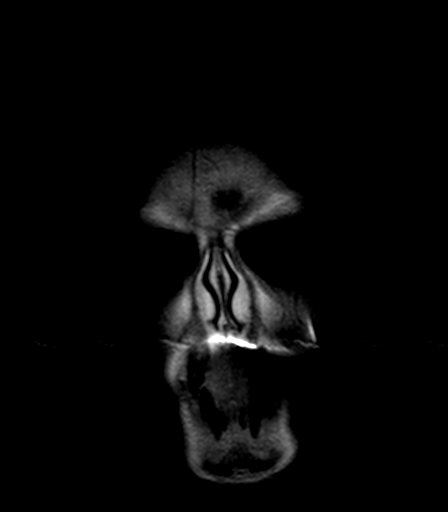

[35 of 48 positions shown; findings below may reference images not displayed]

FINDINGS: MRI HEAD FINDINGS

The diffusion-weighted images demonstrate no evidence for acute or
subacute infarction. No acute hemorrhage or mass lesion is present.
Moderate atrophy and periventricular white matter changes are
similar to the prior exam. The ventricles are proportionate to the
degree of atrophy. No significant extra-axial fluid collection is
present. Dilated perivascular spaces are noted within the basal
ganglia bilaterally.

The internal auditory canals are within normal limits bilaterally.
The brainstem and cerebellum are normal. Flow is present in the
major intracranial arteries.

Bilateral lens replacements are present. The paranasal sinuses are
clear. There is some fluid in the mastoid air cells bilaterally. No
obstructing nasopharyngeal lesion is present.

MRA HEAD FINDINGS

The study is mildly degraded by patient motion. This obscures
signal.

Atherosclerotic changes within the cavernous internal carotid
arteries bilaterally is exaggerated by artifact. The ICA termini are
intact. The A1 and M1 segments are normal. The anterior
communicating artery is dominant on the right. Both A2 segments
fill. The anterior communicating artery is patent. There is
artifactual signal loss in the distal M1 segments bilaterally. MCA
branch vessels are within normal limits bilaterally.

The right vertebral artery is the dominant vessel. The
vertebrobasilar junction is normal. PICA origins are not discretely
seen. The basilar artery is within normal limits. Both posterior
cerebral arteries originate from basilar tip. There is artifactual
signal loss in the proximal posterior cerebral arteries bilaterally.
There is some attenuation of distal PCA branch vessels.
IMPRESSION: 1. No acute or focal intracranial abnormality to explain slurred
speech.
2. Moderate atrophy and white matter disease is similar the prior
study, within normal limits for age.
3. MRA demonstrates mild distal small vessel disease without
significant proximal stenosis, aneurysm, or branch vessel occlusion.
4. The MRA is somewhat limited by patient motion, altering signal
within the proximal PCA branch vessels and distal M1 segments
bilaterally.

## 2020-07-21 ENCOUNTER — Emergency Department: Payer: Medicare (Managed Care)

## 2020-07-21 ENCOUNTER — Other Ambulatory Visit: Payer: Self-pay

## 2020-07-21 ENCOUNTER — Emergency Department
Admission: EM | Admit: 2020-07-21 | Discharge: 2020-07-22 | Disposition: A | Discharge status: Home / Self Care | Payer: Medicare (Managed Care) | Attending: Emergency Medicine | Admitting: Emergency Medicine

## 2020-07-21 DIAGNOSIS — Z7984 Long term (current) use of oral hypoglycemic drugs: Secondary | ICD-10-CM | POA: Diagnosis not present

## 2020-07-21 DIAGNOSIS — Z79899 Other long term (current) drug therapy: Secondary | ICD-10-CM | POA: Diagnosis not present

## 2020-07-21 DIAGNOSIS — B349 Viral infection, unspecified: Secondary | ICD-10-CM | POA: Diagnosis not present

## 2020-07-21 DIAGNOSIS — Z20822 Contact with and (suspected) exposure to covid-19: Secondary | ICD-10-CM | POA: Insufficient documentation

## 2020-07-21 DIAGNOSIS — N390 Urinary tract infection, site not specified: Secondary | ICD-10-CM

## 2020-07-21 DIAGNOSIS — R531 Weakness: Secondary | ICD-10-CM

## 2020-07-21 DIAGNOSIS — M6281 Muscle weakness (generalized): Secondary | ICD-10-CM | POA: Insufficient documentation

## 2020-07-21 DIAGNOSIS — F0390 Unspecified dementia without behavioral disturbance: Secondary | ICD-10-CM | POA: Insufficient documentation

## 2020-07-21 DIAGNOSIS — Z7982 Long term (current) use of aspirin: Secondary | ICD-10-CM | POA: Diagnosis not present

## 2020-07-21 LAB — COMPREHENSIVE METABOLIC PANEL
ALT: 7 U/L (ref 0–44)
AST: 20 U/L (ref 15–41)
Albumin: 2.8 g/dL — ABNORMAL LOW (ref 3.5–5.0)
Alkaline Phosphatase: 50 U/L (ref 38–126)
Anion gap: 11 (ref 5–15)
BUN: 22 mg/dL (ref 8–23)
CO2: 28 mmol/L (ref 22–32)
Calcium: 8.6 mg/dL — ABNORMAL LOW (ref 8.9–10.3)
Chloride: 105 mmol/L (ref 98–111)
Creatinine, Ser: 1.02 mg/dL — ABNORMAL HIGH (ref 0.44–1.00)
GFR, Estimated: 54 mL/min — ABNORMAL LOW (ref >60)
Glucose, Bld: 203 mg/dL — ABNORMAL HIGH (ref 70–99)
Potassium: 3 mmol/L — ABNORMAL LOW (ref 3.5–5.1)
Sodium: 144 mmol/L (ref 135–145)
Total Bilirubin: 0.4 mg/dL (ref 0.3–1.2)
Total Protein: 6.2 g/dL — ABNORMAL LOW (ref 6.5–8.1)

## 2020-07-21 LAB — CBC WITH DIFFERENTIAL/PLATELET
Abs Immature Granulocytes: 0.05 10*3/uL (ref 0.00–0.07)
Basophils Absolute: 0.1 10*3/uL (ref 0.0–0.1)
Basophils Relative: 1 %
Eosinophils Absolute: 0 10*3/uL (ref 0.0–0.5)
Eosinophils Relative: 0 %
HCT: 41.4 % (ref 36.0–46.0)
Hemoglobin: 12.8 g/dL (ref 12.0–15.0)
Immature Granulocytes: 1 %
Lymphocytes Relative: 24 %
Lymphs Abs: 1.6 10*3/uL (ref 0.7–4.0)
MCH: 32.2 pg (ref 26.0–34.0)
MCHC: 30.9 g/dL (ref 30.0–36.0)
MCV: 104 fL — ABNORMAL HIGH (ref 80.0–100.0)
Monocytes Absolute: 0.4 10*3/uL (ref 0.1–1.0)
Monocytes Relative: 6 %
Neutro Abs: 4.5 10*3/uL (ref 1.7–7.7)
Neutrophils Relative %: 68 %
Platelets: 190 10*3/uL (ref 150–400)
RBC: 3.98 MIL/uL (ref 3.87–5.11)
RDW: 14.6 % (ref 11.5–15.5)
WBC: 6.6 10*3/uL (ref 4.0–10.5)
nRBC: 0 % (ref 0.0–0.2)

## 2020-07-21 LAB — CBG MONITORING, ED: Glucose-Capillary: 192 mg/dL — ABNORMAL HIGH (ref 70–99)

## 2020-07-21 MED ORDER — SODIUM CHLORIDE 0.9 % IV BOLUS
500.0000 mL | Freq: Once | INTRAVENOUS | Status: AC
  Administered 2020-07-21: 500 mL via INTRAVENOUS

## 2020-07-21 NOTE — ED Notes (Signed)
Pt taken to CT at this time.

## 2020-07-21 NOTE — ED Triage Notes (Signed)
Pt with weakness from [facility]. Pt's only complaint is weakness, denies cough, fever, nausea, vomting. Per ems staff states pt has been weak today. Skin warm and dry, resps unlabored.

## 2020-07-22 LAB — URINALYSIS, COMPLETE (UACMP) WITH MICROSCOPIC
Bilirubin Urine: NEGATIVE
Glucose, UA: 50 mg/dL — AB
Hgb urine dipstick: NEGATIVE
Ketones, ur: NEGATIVE mg/dL
Nitrite: POSITIVE — AB
Protein, ur: NEGATIVE mg/dL
Specific Gravity, Urine: 1.018 (ref 1.005–1.030)
WBC, UA: >50 WBC/hpf — ABNORMAL HIGH (ref 0–5)
pH: 5 (ref 5.0–8.0)

## 2020-07-22 LAB — SARS CORONAVIRUS 2 (TAT 6-24 HRS): SARS Coronavirus 2: NEGATIVE

## 2020-07-22 MED ORDER — CEPHALEXIN 250 MG PO CAPS: 250.0000 mg | ORAL_CAPSULE | Freq: Three times a day (TID) | ORAL | 0 refills | Status: AC

## 2020-07-22 MED ORDER — SODIUM CHLORIDE 0.9 % IV SOLN
1.0000 g | Freq: Once | INTRAVENOUS | Status: AC
  Administered 2020-07-22: 1 g via INTRAVENOUS
  Filled 2020-07-22: qty 10

## 2020-07-22 NOTE — Discharge Instructions (Addendum)
1.  Take antibiotic as prescribed (Keflex 250mg  3 times daily x7 days). 2.  Urine culture is pending.  Your facility will be notified of any positive results requiring a change in your antibiotic. 3.  COVID result is pending.  Your facility will be notified of any positive result. 4.  Return to the ER for worsening symptoms, persistent vomiting, difficulty breathing or other concerns.

## 2020-07-22 NOTE — ED Provider Notes (Signed)
-----------------------------------------   1:32 AM on 07/22/2020 -----------------------------------------  UA positive for nitrite and leukocytes.  Updated family member who is at bedside.  Will administer IV Rocephin prior to discharge back to [facility].  COVID-19 test result is pending.  Strict return precautions given.  Family member verbalized understanding agrees with plan of care.   Irean Hong, MD 07/22/20 [phone]

## 2020-07-22 NOTE — ED Provider Notes (Signed)
[facility] Emergency Department Provider Note  ____________________________________________  Time seen: Approximately 12:07 AM  I have reviewed the triage vital signs and the nursing notes.   HISTORY  Chief Complaint Weakness (/)    Level 5 Caveat: Portions of the History and Physical including HPI and review of systems are unable to be completely obtained due to patient being a poor historian   HPI Kelly Wright is a 87 y.o. female with a history of a-fib, DVT GERD and dementia who was brought to the ED from [facility] due to generalized weakness, low energy level for today.  Daughter who is arrived to bedside reports that she was like this previously when she had a UTI and when she had a viral illness.  There has been at least 1 COVID case in [facility] currently according to the daughter but she does not think the patient's been tested.   Patient reports that she feels fine and denies any pain or other acute symptoms.     Past Medical History:  Diagnosis Date  . Arthritis   . Atrial fibrillation (HCC)   . Cerebral vascular accident (HCC)   . DVT (deep venous thrombosis) (HCC)   . Dysrhythmia   . GERD (gastroesophageal reflux disease)   . Interstitial lung disease (HCC)   . Osteoporosis      Patient Active Problem List   Diagnosis Date Noted  . Closed displaced intertrochanteric fracture of right femur (HCC) 04/11/2017  . Infection of urinary tract 02/21/2016  . Dementia (HCC) 02/21/2016  . Generalized weakness 02/21/2016  . Slurred speech 02/20/2016  . Depression 02/20/2016  . Broken ribs 08/11/2015  . UTI (lower urinary tract infection) 11/12/2014  . Encephalopathy, metabolic 11/12/2014  . Rheumatoid arthritis (HCC) 11/12/2014  . Chronic anticoagulation 11/12/2014  . Atrial fibrillation (HCC) 11/12/2014  . H/O: CVA (cerebrovascular accident) 11/12/2014     Past Surgical History:  Procedure Laterality Date  . ABDOMINAL  HYSTERECTOMY    . BILATERAL CARPAL TUNNEL RELEASE    . EYE SURGERY    . INTRAMEDULLARY (IM) NAIL INTERTROCHANTERIC Right 04/12/2017   Procedure: INTRAMEDULLARY (IM) NAIL INTERTROCHANTRIC;  Surgeon: Christena Flake, MD;  Location: [facility] ORS;  Service: Orthopedics;  Laterality: Right;     Prior to Admission medications   Medication Sig Start Date End Date Taking? Authorizing Provider  acetaminophen (TYLENOL) 500 MG tablet Take 1 tablet (500 mg total) by mouth every 6 (six) hours. 04/19/17   Ramonita Lab, MD  amiodarone (PACERONE) 100 MG tablet Take 1 tablet by mouth daily. 12/20/17   [provider]  aspirin EC 81 MG tablet Take 81 mg by mouth daily.    [provider]  busPIRone (BUSPAR) 7.5 MG tablet Take 1 tablet by mouth 2 (two) times daily. 12/18/17   [provider]  citalopram (CELEXA) 20 MG tablet Take 30 mg by mouth at bedtime.     [provider]  cyclobenzaprine (FLEXERIL) 5 MG tablet Take 1 tablet (5 mg total) by mouth 3 (three) times daily as needed for muscle spasms. Patient not taking: Reported on 12/31/2017 04/19/17   Ramonita Lab, MD  donepezil (ARICEPT) 10 MG tablet Take 10 mg by mouth at bedtime.    [provider]  fluticasone (FLONASE) 50 MCG/ACT nasal spray Place 1 spray into both nostrils daily. 11/22/17   [provider]  folic acid (FOLVITE) 400 MCG tablet Take 400 mcg by mouth at bedtime.    [provider]  metFORMIN (GLUCOPHAGE) 500 MG tablet Take 1 tablet by mouth 2 (two) times daily. 12/03/17   [provider]  methotrexate (RHEUMATREX) 2.5 MG tablet Take 12.5 mg by mouth every Wednesday.     [provider]  metoprolol succinate (TOPROL-XL) 25 MG 24 hr tablet Take 1 tablet (25 mg total) by mouth daily. 04/20/17   Gouru, Deanna Artis, MD  pantoprazole (PROTONIX) 40 MG tablet Take 1 tablet (40 mg total) by mouth daily. Patient not taking: Reported on 11/04/2017 04/19/17   Ramonita Lab, MD  ranitidine  (ZANTAC) 150 MG tablet Take 150 mg by mouth 2 (two) times daily.    [provider]  vitamin B-12 (CYANOCOBALAMIN) 1000 MCG tablet Take 1,000 mcg by mouth daily.    [provider]     Allergies Patient has no known allergies.   Family History  Problem Relation Age of Onset  . Hodgkin's lymphoma Mother   . Cancer - Lung Father   . CVA Other     Social History Social History   Tobacco Use  . Smoking status: Never Smoker  . Smokeless tobacco: Never Used  Substance Use Topics  . Alcohol use: No  . Drug use: No    Review of Systems Level 5 Caveat: Portions of the History and Physical including HPI and review of systems are unable to be completely obtained due to patient being a poor historian   Constitutional:   No known fever.  ENT:   No rhinorrhea. Cardiovascular:   No chest pain or syncope. Respiratory:   No dyspnea or cough. Gastrointestinal:   Negative for abdominal pain, vomiting and diarrhea.  Musculoskeletal:   Negative for focal pain or swelling ____________________________________________   PHYSICAL EXAM:  VITAL SIGNS: ED Triage Vitals  Enc Vitals Group     BP 07/21/20 2033 (!) 104/51     Pulse Rate 07/21/20 2033 74     Resp 07/21/20 2033 14     Temp 07/21/20 2033 98 F (36.7 C)     Temp Source 07/21/20 2033 Oral     SpO2 07/21/20 2033 94 %     Weight 07/21/20 2034 110 lb (49.9 kg)     Height 07/21/20 2034 5\' 2"  (1.575 m)     Head Circumference --      Peak Flow --      Pain Score 07/21/20 2034 0     Pain Loc --      Pain Edu? --      Excl. in GC? --     Vital signs reviewed, nursing assessments reviewed.   Constitutional:   Alert and oriented to self. Non-toxic appearance. Eyes:   Conjunctivae are normal. EOMI. PERRL. ENT      Head:   Normocephalic and atraumatic.      Nose:   No congestion/rhinnorhea.       Mouth/Throat:   Dry mucous membranes, no pharyngeal erythema. No peritonsillar mass.       Neck:   No meningismus.  Full ROM. Hematological/Lymphatic/Immunilogical:   No cervical lymphadenopathy. Cardiovascular:   RRR. Symmetric bilateral radial and DP pulses.  No murmurs. Cap refill less than 2 seconds. Respiratory:   Normal respiratory effort without tachypnea/retractions. Breath sounds are clear and equal bilaterally. No wheezes/rales/rhonchi. Gastrointestinal:   Soft and nontender. Non distended. There is no CVA tenderness.  No rebound, rigidity, or guarding. Musculoskeletal:   Normal range of motion in all extremities. No joint effusions.  No lower extremity tenderness.  No edema. Neurologic:  Normal speech and language.  Motor grossly intact. No acute focal neurologic deficits are appreciated.  Skin:    Skin is warm, dry and intact. No rash noted.  No petechiae, purpura, or bullae.  ____________________________________________    LABS (pertinent positives/negatives) (all labs ordered are listed, but only abnormal results are displayed) Labs Reviewed  COMPREHENSIVE METABOLIC PANEL - Abnormal; Notable for the following components:      Result Value   Potassium 3.0 (*)    Glucose, Bld 203 (*)    Creatinine, Ser 1.02 (*)    Calcium 8.6 (*)    Total Protein 6.2 (*)    Albumin 2.8 (*)    GFR, Estimated 54 (*)    All other components within normal limits  CBC WITH DIFFERENTIAL/PLATELET - Abnormal; Notable for the following components:   MCV 104.0 (*)    All other components within normal limits  CBG MONITORING, ED - Abnormal; Notable for the following components:   Glucose-Capillary 192 (*)    All other components within normal limits  SARS CORONAVIRUS 2 (TAT 6-24 HRS)  URINALYSIS, COMPLETE (UACMP) WITH MICROSCOPIC   ____________________________________________   EKG  Interpreted by me Sinus bradycardia rate of 52, normal axis, normal intervals.  Normal QRS ST segments and T waves.  No ischemic changes.  ____________________________________________    RADIOLOGY  DG Chest 1  View  Result Date: 07/21/2020 CLINICAL DATA:  Initial evaluation for acute weakness. EXAM: CHEST  1 VIEW COMPARISON:  Prior radiograph from 04/11/2017. FINDINGS: Transverse heart size within normal limits. Mediastinal silhouette normal. Aortic atherosclerosis. Lungs are normally inflated. No focal infiltrates. No pulmonary edema or pleural effusion. No pneumothorax. Several remotely healed left-sided rib fractures noted. No acute osseous finding. Osteopenia noted. IMPRESSION: 1. No radiographic evidence for active cardiopulmonary disease. 2.  Aortic Atherosclerosis (ICD10-I70.0). Electronically Signed   By: Rise Mu M.D.   On: 07/21/2020 21:28   CT Head Wo Contrast  Result Date: 07/21/2020 CLINICAL DATA:  Mental status changes EXAM: CT HEAD WITHOUT CONTRAST TECHNIQUE: Contiguous axial images were obtained from the base of the skull through the vertex without intravenous contrast. COMPARISON:  12/31/2017 FINDINGS: Brain: There is atrophy and chronic small vessel disease changes. Old left occipital infarct. No acute intracranial abnormality. Specifically, no hemorrhage, hydrocephalus, mass lesion, acute infarction, or significant intracranial injury. Vascular: No hyperdense vessel or unexpected calcification. Skull: No acute calvarial abnormality. Sinuses/Orbits: No acute findings Other: None IMPRESSION: Atrophy, chronic microvascular disease. No acute intracranial abnormality. Old left occipital infarct. Electronically Signed   By: Charlett Nose M.D.   On: 07/21/2020 21:15    ____________________________________________   PROCEDURES Procedures  ____________________________________________  DIFFERENTIAL DIAGNOSIS   Intracranial hemorrhage, pneumonia, UTI, dehydration, electrolyte abnormality  CLINICAL IMPRESSION / ASSESSMENT AND PLAN / ED COURSE  Medications ordered in the ED: Medications  sodium chloride 0.9 % bolus 500 mL (0 mLs Intravenous Stopped 07/21/20 2300)    Pertinent labs  & imaging results that were available during my care of the patient were reviewed by me and considered in my medical decision making (see chart for details).   Kelly Wright was evaluated in Emergency Department on 07/22/2020 for the symptoms described in the history of present illness. She was evaluated in the context of the global COVID-19 pandemic, which necessitated consideration that the patient might be at risk for infection with the SARS-CoV-2 virus that causes COVID-19. Institutional protocols and algorithms that pertain to the evaluation of patients at risk for COVID-19 are in a state  of rapid change based on information released by regulatory bodies including the CDC and federal and state organizations. These policies and algorithms were followed during the patient's care in the ED.   Patient with dementia brought to the ED due to low energy level today.  Screening work-up undertaken with CT head and chest x-ray and labs.  Imaging is unremarkable.  I viewed and interpreted the chest x-ray myself which appears normal, radiology report agrees.  Serum labs unremarkable.  Awaiting urinalysis, will start antibiotics as indicated.  We will send a COVID PCR for outpatient follow-up.  After UA result, patient will be suitable for discharge home to [facility] for continued care.      ____________________________________________   FINAL CLINICAL IMPRESSION(S) / ED DIAGNOSES    Final diagnoses:  Generalized weakness  Dementia without behavioral disturbance, unspecified dementia type [facility])     ED Discharge Orders    None      Portions of this note were generated with dragon dictation software. Dictation errors may occur despite best attempts at proofreading.   Sharman Cheek, MD 07/22/20 [id number]

## 2020-07-23 LAB — URINE CULTURE: Culture: >=100000 — AB

## 2020-07-24 LAB — URINE CULTURE

## 2022-06-12 DEATH — deceased
